# Patient Record
Sex: Female | Born: 1970
Health system: Southern US, Community
[De-identification: ages and names within clinical notes are randomized; demographics above are authoritative.]

## PROBLEM LIST (undated history)

## (undated) DIAGNOSIS — D649 Anemia, unspecified: Secondary | ICD-10-CM

## (undated) DIAGNOSIS — M1990 Unspecified osteoarthritis, unspecified site: Secondary | ICD-10-CM

## (undated) DIAGNOSIS — H35039 Hypertensive retinopathy, unspecified eye: Secondary | ICD-10-CM

## (undated) DIAGNOSIS — E119 Type 2 diabetes mellitus without complications: Secondary | ICD-10-CM

## (undated) DIAGNOSIS — I10 Essential (primary) hypertension: Secondary | ICD-10-CM

## (undated) HISTORY — DX: Essential (primary) hypertension: I10

## (undated) HISTORY — PX: BACK SURGERY: SHX140

## (undated) HISTORY — DX: Type 2 diabetes mellitus without complications: E11.9

## (undated) HISTORY — DX: Hypertensive retinopathy, unspecified eye: H35.039

---

## 1998-06-24 ENCOUNTER — Encounter: Admission: RE | Admit: 1998-06-24 | Discharge: 1998-06-24 | Payer: Self-pay | Admitting: *Deleted

## 1999-12-12 ENCOUNTER — Inpatient Hospital Stay (HOSPITAL_COMMUNITY): Admission: AD | Admit: 1999-12-12 | Discharge: 1999-12-12 | Payer: Self-pay | Admitting: *Deleted

## 2001-05-12 ENCOUNTER — Emergency Department (HOSPITAL_COMMUNITY): Admission: EM | Admit: 2001-05-12 | Discharge: 2001-05-12 | Payer: Self-pay | Admitting: Emergency Medicine

## 2001-05-12 ENCOUNTER — Encounter: Payer: Self-pay | Admitting: Emergency Medicine

## 2005-05-21 ENCOUNTER — Emergency Department (HOSPITAL_COMMUNITY): Admission: EM | Admit: 2005-05-21 | Discharge: 2005-05-21 | Payer: Self-pay | Admitting: Emergency Medicine

## 2005-05-22 ENCOUNTER — Emergency Department (HOSPITAL_COMMUNITY): Admission: EM | Admit: 2005-05-22 | Discharge: 2005-05-22 | Payer: Self-pay | Admitting: Emergency Medicine

## 2005-06-01 ENCOUNTER — Emergency Department (HOSPITAL_COMMUNITY): Admission: EM | Admit: 2005-06-01 | Discharge: 2005-06-01 | Payer: Self-pay | Admitting: Emergency Medicine

## 2005-10-15 ENCOUNTER — Ambulatory Visit: Payer: Self-pay | Admitting: Nurse Practitioner

## 2005-10-17 ENCOUNTER — Ambulatory Visit (HOSPITAL_COMMUNITY): Admission: RE | Admit: 2005-10-17 | Discharge: 2005-10-17 | Payer: Self-pay | Admitting: Internal Medicine

## 2005-10-24 ENCOUNTER — Ambulatory Visit: Payer: Self-pay | Admitting: Nurse Practitioner

## 2005-11-06 ENCOUNTER — Ambulatory Visit: Payer: Self-pay | Admitting: *Deleted

## 2006-03-22 ENCOUNTER — Inpatient Hospital Stay (HOSPITAL_COMMUNITY): Admission: RE | Admit: 2006-03-22 | Discharge: 2006-03-25 | Payer: Self-pay | Admitting: Neurosurgery

## 2007-09-10 ENCOUNTER — Emergency Department (HOSPITAL_COMMUNITY): Admission: EM | Admit: 2007-09-10 | Discharge: 2007-09-10 | Payer: Self-pay | Admitting: Emergency Medicine

## 2007-12-14 ENCOUNTER — Emergency Department (HOSPITAL_COMMUNITY): Admission: EM | Admit: 2007-12-14 | Discharge: 2007-12-14 | Payer: Self-pay | Admitting: Emergency Medicine

## 2007-12-16 ENCOUNTER — Emergency Department (HOSPITAL_COMMUNITY): Admission: EM | Admit: 2007-12-16 | Discharge: 2007-12-16 | Payer: Self-pay | Admitting: Emergency Medicine

## 2011-05-18 NOTE — Op Note (Signed)
Karen Tanner, Karen Tanner NO.:  [id number]   MEDICAL RECORD NO.:  [id number]          PATIENT TYPE:  INP   LOCATION:  3010                         FACILITY:  [facility]   PHYSICIAN:  Danae Orleans. Venetia Maxon, M.D.  DATE OF BIRTH:  Jan 05, 1971   DATE OF PROCEDURE:  03/22/2006  DATE OF DISCHARGE:                                 OPERATIVE REPORT   PREOPERATIVE DIAGNOSIS:  Lumbar disk  herniation and spondylosis, stenosis,  degenerative disk disease, radiculopathy and morbid obesity, L5-S1 level.   POSTOPERATIVE DIAGNOSIS:  Lumbar disk  herniation and spondylosis, stenosis,  degenerative disk disease, radiculopathy and morbid obesity, L5-S1 level.   OPERATION PERFORMED:  Bilateral diskectomy L5-S1 with posterior lumbar  interbody fusion with 10 mm PEEK interbody cages, morcellized bone autograft  and Osteocel with pedicle screw fixation, L5 through S1 levels bilaterally  with posterolateral arthrodesis with morcellized bone autograft and  Osteocel.   SURGEON:  Danae Orleans. Venetia Maxon, M.D.   ASSISTANT:  Cristi Loron, M.D.   ANESTHESIA:  General endotracheal.   ESTIMATED BLOOD LOSS:  150 mL.   COMPLICATIONS:  None.   DISPOSITION:  Recovery.   INDICATIONS FOR PROCEDURE:  Karen Tanner is a 40 year old woman with a  very large disk herniation at the L5-S1 level with severe spinal stenosis at  this level.  She has significant degeneration at this level.  She is  morbidly obese.  It was elected to take her to surgery for decompression and  fusion at this level.   DESCRIPTION OF PROCEDURE:  Ms. Blass was brought to the operating room.  Following the satisfactory and uncomplicated induction of general  endotracheal anesthesia and placement of intravenous lines, the patient was  placed in a prone position on the [place] table.  The low back was then  prepped and draped in the usual sterile fashion.  The area of planned  incision was infiltrated with 0.25% Marcaine, 0.5%  lidocaine, 1:200,000  epinephrine.  Incision was made in the posterior lumbar skin, carried  through approximately 5 to 6 inches of adipose tissue to the lumbodorsal  fascia which was incised bilaterally.  Subperiosteal dissection was then  performed exposing what was felt to be the L5-S1 interspace and  intraoperative x-ray confirmed this to be the L5-S1 interspace.  Subsequently the transverse processes of L5 and the sacral ala were exposed  bilaterally.  120 mm retractor arms were placed with a Versatrac retractor  and a laminectomy of L5 was performed bilaterally removing the laminae of L5  and decorticating the superior aspect of the S1 facet and the sacral ala  bilaterally.  Subsequently, an extremely large disk herniation was removed  initially on the left subsequently on the right.  There was severe  compression of the nerve roots and thecal sac and the interspace was  evacuated of disk material.  Aggressive diskectomy was performed.  Subsequently the trial spacer was placed.  A 10 mm spacer was placed and the  end plates were stripped of residual disk material down to bleeding bone.  5  cc of Osteocel was mixed with morcellized bone autograft as  well as  drillings from the laminae of L5 and this was then mixed together, packed  within  10 mm PEEK interbody cages which were then inserted in the  interspace and countersunk appropriately bilaterally.  Additional bone  autograft with Osteocel was then placed within the interspace.  Subsequently  the transverse processes of L4 and sacral ala were decorticated bilaterally  and 40 mm x 6.5 mm sacral screws were placed at sacrum with bicortical  purchase and subsequently 40 mm x 6.5 mm screws were placed at L5.  All  screws had excellent purchase and no evidence of any cutouts and the  positioning was confirmed on AP and lateral fluoroscopy.  40 mm rods were  placed after the remaining bone autograft and Osteocel mixture was placed  over  the transverse processes of L5 sacral ala bilaterally and prior to  doing so, the wound was irrigated.  40 mm rods were placed and locked down  in situ and the lumbodorsal fascia was closed with 1 Vicryl suture.  The  subcutaneous tissues were reapproximated with 2-0 Vicryl interrupted  inverted sutures and the skin edges were reapproximated with interrupted 2-0  and 3-0 Vicryl subcuticular stitch.  The wound was dressed with benzoin and  Steri-Strips, Telfa gauze and tape.  The patient was extubated in the  operating room and taken to the recovery room in stable and satisfactory  condition having tolerated the operation well.  Counts were correct at the  end of the case.      Danae Orleans. Venetia Maxon, M.D.  Electronically Signed     JDS/MEDQ  D:  03/22/2006  T:  03/25/2006  Job:  [id number]

## 2011-05-18 NOTE — Discharge Summary (Signed)
[name], [name] NO.:  [id number]   MEDICAL RECORD NO.:  [id number]          PATIENT TYPE:  INP   LOCATION:  3010                         FACILITY:  [facility]   PHYSICIAN:  Danae Orleans. Venetia Maxon, M.D.  DATE OF BIRTH:  [birth year]   DATE OF ADMISSION:  03/22/2006  DATE OF DISCHARGE:  03/25/2006                                 DISCHARGE SUMMARY   REASON FOR ADMISSION:  Lumbosacral spondylosis, lumbar disk degeneration,  lumbar disk herniation, morbid obesity, tobacco use disorder, and lumbar  radiculopathy.   FINAL DIAGNOSES:  1.  Lumbosacral spondylosis.  2.  Lumbar disk degeneration.  3.  Lumbar disk herniation.  4.  Morbid obesity.  5.  Tobacco use disorder.  6.  Lumbar radiculopathy.   HISTORY OF ILLNESS AND HOSPITAL COURSE:  Karen Tanner is a 40 year old  woman with severe low back and bilateral lower extremity pain with large L5-  S1 disk herniation with significant disk degeneration at this level.  She  has low back pain and lumbar radiculopathy.  She was admitted to the  hospital on the same day of procedure basis and underwent uncomplicated  decompression and fusion at the L5-S1 level.  She did well with this  procedure, was gradually mobilized, and was doing well on the 26th at which  point she was discharged home with discharge medications of Vicodin with  improved condition.   DISCHARGE DIAGNOSES:  1.  Lumbosacral spondylosis.  2.  Lumbar disk degeneration.  3.  Lumbar disk herniation.  4.  Morbid obesity.  5.  Tobacco use disorder.  6.  Lumbar radiculopathy.   Follow up in three weeks in the office.  Instructed to wear brace, not to  engage in any heavy lifting, bending, twisting, or driving.      Danae Orleans. Venetia Maxon, M.D.  Electronically Signed     JDS/MEDQ  D:  05/15/2006  T:  05/15/2006  Job:  [id number]

## 2011-10-08 LAB — CULTURE, ROUTINE-ABSCESS: Gram Stain: NONE SEEN

## 2013-11-13 ENCOUNTER — Emergency Department (INDEPENDENT_AMBULATORY_CARE_PROVIDER_SITE_OTHER)
Admission: EM | Admit: 2013-11-13 | Discharge: 2013-11-13 | Disposition: A | Discharge status: Home / Self Care | Payer: Self-pay | Source: Home / Self Care | Attending: Family Medicine | Admitting: Family Medicine

## 2013-11-13 ENCOUNTER — Encounter (HOSPITAL_COMMUNITY): Payer: Self-pay | Admitting: Emergency Medicine

## 2013-11-13 ENCOUNTER — Emergency Department (INDEPENDENT_AMBULATORY_CARE_PROVIDER_SITE_OTHER): Payer: Self-pay

## 2013-11-13 DIAGNOSIS — M533 Sacrococcygeal disorders, not elsewhere classified: Secondary | ICD-10-CM

## 2013-11-13 DIAGNOSIS — M545 Low back pain: Secondary | ICD-10-CM

## 2013-11-13 MED ORDER — KETOROLAC TROMETHAMINE 10 MG PO TABS: 10.0000 mg | ORAL_TABLET | Freq: Four times a day (QID) | ORAL | Status: DC | PRN

## 2013-11-13 MED ORDER — KETOROLAC TROMETHAMINE 30 MG/ML IJ SOLN
30.0000 mg | Freq: Once | INTRAMUSCULAR | Status: AC
  Administered 2013-11-13: 30 mg via INTRAMUSCULAR

## 2013-11-13 MED ORDER — CYCLOBENZAPRINE HCL 5 MG PO TABS: 5.0000 mg | ORAL_TABLET | Freq: Three times a day (TID) | ORAL | Status: DC | PRN

## 2013-11-13 MED ORDER — KETOROLAC TROMETHAMINE 30 MG/ML IJ SOLN
INTRAMUSCULAR | Status: AC
  Filled 2013-11-13: qty 1

## 2013-11-13 NOTE — ED Provider Notes (Signed)
CSN: [id number]     Arrival date & time 11/13/13  1212 History   First MD Initiated Contact with Patient 11/13/13 1342     Chief Complaint  Patient presents with  . Foot Pain   (Consider location/radiation/quality/duration/timing/severity/associated sxs/prior Treatment) Patient is a 42 y.o. female presenting with back pain. The history is provided by the patient.  Back Pain Location:  Lumbar spine Quality:  Shooting Radiates to:  Does not radiate Pain severity:  Moderate Duration:  5 days Progression:  Unchanged Chronicity:  New Context: falling   Context comment:  S/p back fusion yrs ago, back has been hurting since fall at [employer] after stubbing toe wearing high heels Associated symptoms: no abdominal pain, no chest pain, no fever, no leg pain, no numbness, no paresthesias, no pelvic pain, no perianal numbness, no tingling and no weakness     History reviewed. No pertinent past medical history. History reviewed. No pertinent past surgical history. History reviewed. No pertinent family history. History  Substance Use Topics  . Smoking status: Never Smoker   . Smokeless tobacco: Not on file  . Alcohol Use: No   OB History   Grav Para Term Preterm Abortions TAB SAB Ect Mult Living                 Review of Systems  Constitutional: Negative.  Negative for fever.  Cardiovascular: Negative for chest pain.  Gastrointestinal: Negative.  Negative for abdominal pain.  Genitourinary: Negative.  Negative for menstrual problem and pelvic pain.  Musculoskeletal: Positive for back pain. Negative for gait problem and joint swelling.  Neurological: Negative for tingling, weakness, numbness and paresthesias.    Allergies  Review of patient's allergies indicates no known allergies.  Home Medications   Current Outpatient Rx  Name  Route  Sig  Dispense  Refill  . cyclobenzaprine (FLEXERIL) 5 MG tablet   Oral   Take 1 tablet (5 mg total) by mouth 3 (three) times daily as  needed for muscle spasms.   30 tablet   0   . ketorolac (TORADOL) 10 MG tablet   Oral   Take 1 tablet (10 mg total) by mouth every 6 (six) hours as needed. for back pain   20 tablet   0    BP 138/76  Pulse 134  Temp(Src) 97.7 F (36.5 C) (Oral)  Resp 16  LMP 10/24/2013 Physical Exam  Nursing note and vitals reviewed. Constitutional: She is oriented to person, place, and time. She appears well-developed and well-nourished.  Abdominal: Soft. Bowel sounds are normal. She exhibits no distension. There is no tenderness.  Musculoskeletal: She exhibits tenderness.       Lumbar back: She exhibits tenderness, bony tenderness and pain. She exhibits normal range of motion, no swelling, no deformity, no spasm and normal pulse.       Back:  Neurological: She is alert and oriented to person, place, and time.  Skin: Skin is warm and dry.    ED Course  Procedures (including critical care time) Labs Review Labs Reviewed - No data to display Imaging Review Dg Lumbar Spine Complete  11/13/2013   CLINICAL DATA:  Low back pain following recent injury  EXAM: LUMBAR SPINE - COMPLETE 4+ VIEW  COMPARISON:  None.  FINDINGS: Postsurgical changes are noted at L5-S1. Vertebral body height is well maintained. No spondylolysis or spondylolisthesis is seen. No hardware failure is noted.  IMPRESSION: Postoperative change.  No acute abnormality is seen.   Electronically Signed   By:  Alcide Clever M.D.   On: 11/13/2013 14:53    EKG Interpretation     Ventricular Rate:    PR Interval:    QRS Duration:   QT Interval:    QTC Calculation:   R Axis:     Text Interpretation:              MDM  X-rays reviewed and report per radiologist.     Linna Hoff, MD 11/13/13 [phone]

## 2013-11-13 NOTE — ED Notes (Signed)
C/o pain in her foot past couple of days; no new injury

## 2014-01-06 ENCOUNTER — Ambulatory Visit: Payer: Self-pay

## 2014-01-28 ENCOUNTER — Ambulatory Visit: Payer: Self-pay | Attending: Internal Medicine

## 2014-04-13 ENCOUNTER — Ambulatory Visit: Payer: Self-pay | Admitting: Internal Medicine

## 2014-07-06 ENCOUNTER — Ambulatory Visit: Payer: Self-pay | Admitting: Internal Medicine

## 2014-10-26 ENCOUNTER — Encounter: Payer: Self-pay | Admitting: Internal Medicine

## 2014-10-26 ENCOUNTER — Ambulatory Visit: Payer: Self-pay | Attending: Internal Medicine | Admitting: Internal Medicine

## 2014-10-26 ENCOUNTER — Ambulatory Visit (HOSPITAL_COMMUNITY)
Admission: RE | Admit: 2014-10-26 | Discharge: 2014-10-26 | Disposition: A | Discharge status: Home / Self Care | Payer: Self-pay | Source: Ambulatory Visit | Attending: Internal Medicine | Admitting: Internal Medicine

## 2014-10-26 VITALS — BP 143/90 | HR 83 | Temp 98.0°F | Resp 16 | Wt 276.6 lb

## 2014-10-26 DIAGNOSIS — F17210 Nicotine dependence, cigarettes, uncomplicated: Secondary | ICD-10-CM | POA: Insufficient documentation

## 2014-10-26 DIAGNOSIS — Z9889 Other specified postprocedural states: Secondary | ICD-10-CM

## 2014-10-26 DIAGNOSIS — R51 Headache: Secondary | ICD-10-CM | POA: Insufficient documentation

## 2014-10-26 DIAGNOSIS — M545 Low back pain, unspecified: Secondary | ICD-10-CM | POA: Insufficient documentation

## 2014-10-26 DIAGNOSIS — Z23 Encounter for immunization: Secondary | ICD-10-CM

## 2014-10-26 DIAGNOSIS — Z139 Encounter for screening, unspecified: Secondary | ICD-10-CM

## 2014-10-26 DIAGNOSIS — IMO0001 Reserved for inherently not codable concepts without codable children: Secondary | ICD-10-CM

## 2014-10-26 DIAGNOSIS — N920 Excessive and frequent menstruation with regular cycle: Secondary | ICD-10-CM | POA: Insufficient documentation

## 2014-10-26 DIAGNOSIS — Z862 Personal history of diseases of the blood and blood-forming organs and certain disorders involving the immune mechanism: Secondary | ICD-10-CM

## 2014-10-26 DIAGNOSIS — M549 Dorsalgia, unspecified: Secondary | ICD-10-CM | POA: Insufficient documentation

## 2014-10-26 DIAGNOSIS — R030 Elevated blood-pressure reading, without diagnosis of hypertension: Secondary | ICD-10-CM | POA: Insufficient documentation

## 2014-10-26 DIAGNOSIS — G8929 Other chronic pain: Secondary | ICD-10-CM | POA: Insufficient documentation

## 2014-10-26 DIAGNOSIS — Z833 Family history of diabetes mellitus: Secondary | ICD-10-CM | POA: Insufficient documentation

## 2014-10-26 DIAGNOSIS — I10 Essential (primary) hypertension: Secondary | ICD-10-CM | POA: Insufficient documentation

## 2014-10-26 DIAGNOSIS — I152 Hypertension secondary to endocrine disorders: Secondary | ICD-10-CM | POA: Insufficient documentation

## 2014-10-26 LAB — COMPLETE METABOLIC PANEL WITH GFR
ALK PHOS: 68 U/L (ref 39–117)
ALT: 12 U/L (ref 0–35)
AST: 17 U/L (ref 0–37)
Albumin: 4.1 g/dL (ref 3.5–5.2)
BILIRUBIN TOTAL: 0.6 mg/dL (ref 0.2–1.2)
BUN: 6 mg/dL (ref 6–23)
CO2: 26 mEq/L (ref 19–32)
CREATININE: 0.72 mg/dL (ref 0.50–1.10)
Calcium: 9.4 mg/dL (ref 8.4–10.5)
Chloride: 101 mEq/L (ref 96–112)
GFR, Est African American: >89 mL/min
GLUCOSE: 89 mg/dL (ref 70–99)
Potassium: 4.3 mEq/L (ref 3.5–5.3)
SODIUM: 137 meq/L (ref 135–145)
TOTAL PROTEIN: 7.4 g/dL (ref 6.0–8.3)

## 2014-10-26 LAB — TSH: TSH: 0.632 u[IU]/mL (ref 0.350–4.500)

## 2014-10-26 LAB — HEMOGLOBIN A1C
Hgb A1c MFr Bld: 6.7 % — ABNORMAL HIGH (ref <5.7)
Mean Plasma Glucose: 146 mg/dL — ABNORMAL HIGH (ref <117)

## 2014-10-26 LAB — LIPID PANEL
CHOLESTEROL: 168 mg/dL (ref 0–200)
HDL: 52 mg/dL (ref >39)
LDL CALC: 97 mg/dL (ref 0–99)
TRIGLYCERIDES: 96 mg/dL (ref <150)
Total CHOL/HDL Ratio: 3.2 Ratio
VLDL: 19 mg/dL (ref 0–40)

## 2014-10-26 MED ORDER — CYCLOBENZAPRINE HCL 10 MG PO TABS: 10.0000 mg | ORAL_TABLET | Freq: Every day | ORAL | Status: DC

## 2014-10-26 MED ORDER — AMLODIPINE BESYLATE 2.5 MG PO TABS
2.5000 mg | ORAL_TABLET | Freq: Every day | ORAL | Status: DC
Start: 2014-10-26 — End: 2014-12-09

## 2014-10-26 MED ORDER — IBUPROFEN 600 MG PO TABS: 600.0000 mg | ORAL_TABLET | Freq: Three times a day (TID) | ORAL | Status: DC | PRN

## 2014-10-26 NOTE — Patient Instructions (Addendum)
DASH Eating Plan DASH stands for "Dietary Approaches to Stop Hypertension." The DASH eating plan is a healthy eating plan that has been shown to reduce high blood pressure (hypertension). Additional health benefits may include reducing the risk of type 2 diabetes mellitus, heart disease, and stroke. The DASH eating plan may also help with weight loss. WHAT DO I NEED TO KNOW ABOUT THE DASH EATING PLAN? For the DASH eating plan, you will follow these general guidelines:  Choose foods with a percent daily value for sodium of less than 5% (as listed on the food label).  Use salt-free seasonings or herbs instead of table salt or sea salt.  Check with your health care provider or pharmacist before using salt substitutes.  Eat lower-sodium products, often labeled as "lower sodium" or "no salt added."  Eat fresh foods.  Eat more vegetables, fruits, and low-fat dairy products.  Choose whole grains. Look for the word "whole" as the first word in the ingredient list.  Choose fish and skinless chicken or turkey more often than red meat. Limit fish, poultry, and meat to 6 oz (170 g) each day.  Limit sweets, desserts, sugars, and sugary drinks.  Choose heart-healthy fats.  Limit cheese to 1 oz (28 g) per day.  Eat more home-cooked food and less restaurant, buffet, and fast food.  Limit fried foods.  Cook foods using methods other than frying.  Limit canned vegetables. If you do use them, rinse them well to decrease the sodium.  When eating at a restaurant, ask that your food be prepared with less salt, or no salt if possible. WHAT FOODS CAN I EAT? Seek help from a dietitian for individual calorie needs. Grains Whole grain or whole wheat bread. Brown rice. Whole grain or whole wheat pasta. Quinoa, bulgur, and whole grain cereals. Low-sodium cereals. Corn or whole wheat flour tortillas. Whole grain cornbread. Whole grain crackers. Low-sodium crackers. Vegetables Fresh or frozen vegetables  (raw, steamed, roasted, or grilled). Low-sodium or reduced-sodium tomato and vegetable juices. Low-sodium or reduced-sodium tomato sauce and paste. Low-sodium or reduced-sodium canned vegetables.  Fruits All fresh, canned (in natural juice), or frozen fruits. Meat and Other Protein Products Ground beef (85% or leaner), grass-fed beef, or beef trimmed of fat. Skinless chicken or turkey. Ground chicken or turkey. Pork trimmed of fat. All fish and seafood. Eggs. Dried beans, peas, or lentils. Unsalted nuts and seeds. Unsalted canned beans. Dairy Low-fat dairy products, such as skim or 1% milk, 2% or reduced-fat cheeses, low-fat ricotta or cottage cheese, or plain low-fat yogurt. Low-sodium or reduced-sodium cheeses. Fats and Oils Tub margarines without trans fats. Light or reduced-fat mayonnaise and salad dressings (reduced sodium). Avocado. Safflower, olive, or canola oils. Natural peanut or almond butter. Other Unsalted popcorn and pretzels. The items listed above may not be a complete list of recommended foods or beverages. Contact your dietitian for more options. WHAT FOODS ARE NOT RECOMMENDED? Grains White bread. White pasta. White rice. Refined cornbread. Bagels and croissants. Crackers that contain trans fat. Vegetables Creamed or fried vegetables. Vegetables in a cheese sauce. Regular canned vegetables. Regular canned tomato sauce and paste. Regular tomato and vegetable juices. Fruits Dried fruits. Canned fruit in light or heavy syrup. Fruit juice. Meat and Other Protein Products Fatty cuts of meat. Ribs, chicken wings, bacon, sausage, bologna, salami, chitterlings, fatback, hot dogs, bratwurst, and packaged luncheon meats. Salted nuts and seeds. Canned beans with salt. Dairy Whole or 2% milk, cream, half-and-half, and cream cheese. Whole-fat or sweetened yogurt. Full-fat   cheeses or blue cheese. Nondairy creamers and whipped toppings. Processed cheese, cheese spreads, or cheese  curds. Condiments Onion and garlic salt, seasoned salt, table salt, and sea salt. Canned and packaged gravies. Worcestershire sauce. Tartar sauce. Barbecue sauce. Teriyaki sauce. Soy sauce, including reduced sodium. Steak sauce. Fish sauce. Oyster sauce. Cocktail sauce. Horseradish. Ketchup and mustard. Meat flavorings and tenderizers. Bouillon cubes. Hot sauce. Tabasco sauce. Marinades. Taco seasonings. Relishes. Fats and Oils Butter, stick margarine, lard, shortening, ghee, and bacon fat. Coconut, palm kernel, or palm oils. Regular salad dressings. Other Pickles and olives. Salted popcorn and pretzels. The items listed above may not be a complete list of foods and beverages to avoid. Contact your dietitian for more information. WHERE CAN I FIND MORE INFORMATION? National Heart, Lung, and Blood Institute: www.nhlbi.nih.gov/health/health-topics/topics/dash/ Document Released: 12/06/2011 Document Revised: 05/03/2014 Document Reviewed: 10/21/2013 ExitCare Patient Information 2015 [employer]. This information is not intended to replace advice given to you by your health care provider. Make sure you discuss any questions you have with your health care provider. Smoking Cessation Quitting smoking is important to your health and has many advantages. However, it is not always easy to quit since nicotine is a very addictive drug. Oftentimes, people try 3 times or more before being able to quit. This document explains the best ways for you to prepare to quit smoking. Quitting takes hard work and a lot of effort, but you can do it. ADVANTAGES OF QUITTING SMOKING  You will live longer, feel better, and live better.  Your body will feel the impact of quitting smoking almost immediately.  Within 20 minutes, blood pressure decreases. Your pulse returns to its normal level.  After 8 hours, carbon monoxide levels in the blood return to normal. Your oxygen level increases.  After 24 hours, the chance of  having a heart attack starts to decrease. Your breath, hair, and body stop smelling like smoke.  After 48 hours, damaged nerve endings begin to recover. Your sense of taste and smell improve.  After 72 hours, the body is virtually free of nicotine. Your bronchial tubes relax and breathing becomes easier.  After 2 to 12 weeks, lungs can hold more air. Exercise becomes easier and circulation improves.  The risk of having a heart attack, stroke, cancer, or lung disease is greatly reduced.  After 1 year, the risk of coronary heart disease is cut in half.  After 5 years, the risk of stroke falls to the same as a nonsmoker.  After 10 years, the risk of lung cancer is cut in half and the risk of other cancers decreases significantly.  After 15 years, the risk of coronary heart disease drops, usually to the level of a nonsmoker.  If you are pregnant, quitting smoking will improve your chances of having a healthy baby.  The people you live with, especially any children, will be healthier.  You will have extra money to spend on things other than cigarettes. QUESTIONS TO THINK ABOUT BEFORE ATTEMPTING TO QUIT You may want to talk about your answers with your health care provider.  Why do you want to quit?  If you tried to quit in the past, what helped and what did not?  What will be the most difficult situations for you after you quit? How will you plan to handle them?  Who can help you through the tough times? Your family? Friends? A health care provider?  What pleasures do you get from smoking? What ways can you still get pleasure   if you quit? Here are some questions to ask your health care provider:  How can you help me to be successful at quitting?  What medicine do you think would be best for me and how should I take it?  What should I do if I need more help?  What is smoking withdrawal like? How can I get information on withdrawal? GET READY  Set a quit date.  Change your  environment by getting rid of all cigarettes, ashtrays, matches, and lighters in your home, car, or work. Do not let people smoke in your home.  Review your past attempts to quit. Think about what worked and what did not. GET SUPPORT AND ENCOURAGEMENT You have a better chance of being successful if you have help. You can get support in many ways.  Tell your family, friends, and coworkers that you are going to quit and need their support. Ask them not to smoke around you.  Get individual, group, or telephone counseling and support. Programs are available at local hospitals and health centers. Call your local health department for information about programs in your area.  Spiritual beliefs and practices may help some smokers quit.  Download a "quit meter" on your computer to keep track of quit statistics, such as how long you have gone without smoking, cigarettes not smoked, and money saved.  Get a self-help book about quitting smoking and staying off tobacco. LEARN NEW SKILLS AND BEHAVIORS  Distract yourself from urges to smoke. Talk to someone, go for a walk, or occupy your time with a task.  Change your normal routine. Take a different route to work. Drink tea instead of coffee. Eat breakfast in a different place.  Reduce your stress. Take a hot bath, exercise, or read a book.  Plan something enjoyable to do every day. Reward yourself for not smoking.  Explore interactive web-based programs that specialize in helping you quit. GET MEDICINE AND USE IT CORRECTLY Medicines can help you stop smoking and decrease the urge to smoke. Combining medicine with the above behavioral methods and support can greatly increase your chances of successfully quitting smoking.  Nicotine replacement therapy helps deliver nicotine to your body without the negative effects and risks of smoking. Nicotine replacement therapy includes nicotine gum, lozenges, inhalers, nasal sprays, and skin patches. Some may be  available over-the-counter and others require a prescription.  Antidepressant medicine helps people abstain from smoking, but how this works is unknown. This medicine is available by prescription.  Nicotinic receptor partial agonist medicine simulates the effect of nicotine in your brain. This medicine is available by prescription. Ask your health care provider for advice about which medicines to use and how to use them based on your health history. Your health care provider will tell you what side effects to look out for if you choose to be on a medicine or therapy. Carefully read the information on the package. Do not use any other product containing nicotine while using a nicotine replacement product.  RELAPSE OR DIFFICULT SITUATIONS Most relapses occur within the first 3 months after quitting. Do not be discouraged if you start smoking again. Remember, most people try several times before finally quitting. You may have symptoms of withdrawal because your body is used to nicotine. You may crave cigarettes, be irritable, feel very hungry, cough often, get headaches, or have difficulty concentrating. The withdrawal symptoms are only temporary. They are strongest when you first quit, but they will go away within 10-14 days. To reduce the   chances of relapse, try to:  Avoid drinking alcohol. Drinking lowers your chances of successfully quitting.  Reduce the amount of caffeine you consume. Once you quit smoking, the amount of caffeine in your body increases and can give you symptoms, such as a rapid heartbeat, sweating, and anxiety.  Avoid smokers because they can make you want to smoke.  Do not let weight gain distract you. Many smokers will gain weight when they quit, usually less than 10 pounds. Eat a healthy diet and stay active. You can always lose the weight gained after you quit.  Find ways to improve your mood other than smoking. FOR MORE INFORMATION  www.smokefree.gov  Document Released:  12/11/2001 Document Revised: 05/03/2014 Document Reviewed: 03/27/2012 ExitCare Patient Information 2015 [employer]. This information is not intended to replace advice given to you by your health care provider. Make sure you discuss any questions you have with your health care provider.  

## 2014-10-26 NOTE — Progress Notes (Signed)
Patient here to establish care Patient has a history of of back surgery-(bone fusion) Has trouble standing for long periods of time Today presents with elevated blood pressure and headache for the past couple  Of days

## 2014-10-26 NOTE — Progress Notes (Signed)
Patient Demographics  Karen Tanner, is a 43 y.o. female  [id number]  [id number]  DOB - 1971/09/18  CC:  Chief Complaint  Patient presents with  . Establish Care    back pain and headache       HPI: Karen Tanner is a 43 y.o. female here today to establish medical care. Patient has history of lower back surgery, complaints of chronic lower back pain, as per patient after surgery she lost follow with her back surgeon, she was also told her recently that her blood pressure is elevated and today her blood pressure is 143/90 she does complain of occasional headaches. Patient also has not had mammogram, Pap smear done, she also reports menorrhagia and was also told her iron was low. Patient has No headache, No chest pain, No abdominal pain - No Nausea, No new weakness tingling or numbness, No Cough - SOB.  No Known Allergies History reviewed. No pertinent past medical history. Current Outpatient Prescriptions on File Prior to Visit  Medication Sig Dispense Refill  . ketorolac (TORADOL) 10 MG tablet Take 1 tablet (10 mg total) by mouth every 6 (six) hours as needed. for back pain  20 tablet  0   No current facility-administered medications on file prior to visit.   Family History  Problem Relation Age of Onset  . Diabetes Mother   . Hyperlipidemia Mother   . Heart disease Mother   . Diabetes Maternal Grandmother    History   Social History  . Marital Status: Single    Spouse Name: N/A    Number of Children: N/A  . Years of Education: N/A   Occupational History  . Not on file.   Social History Main Topics  . Smoking status: Current Some Day Smoker -- 0.25 packs/day for 14 years  . Smokeless tobacco: Not on file  . Alcohol Use: Yes  . Drug Use: Not on file  . Sexual Activity: Not on file   Other Topics Concern  . Not on file   Social History Narrative  . No narrative on file    Review of Systems: Constitutional: Negative for fever, chills,  diaphoresis, activity change, appetite change and fatigue. HENT: Negative for ear pain, nosebleeds, congestion, facial swelling, rhinorrhea, neck pain, neck stiffness and ear discharge.  Eyes: Negative for pain, discharge, redness, itching and visual disturbance. Respiratory: Negative for cough, choking, chest tightness, shortness of breath, wheezing and stridor.  Cardiovascular: Negative for chest pain, palpitations and leg swelling. Gastrointestinal: Negative for abdominal distention. Genitourinary: Negative for dysuria, urgency, frequency, hematuria, flank pain, decreased urine volume, difficulty urinating and dyspareunia.  Musculoskeletal: Negative for back pain, joint swelling, arthralgia and gait problem. Neurological: Negative for dizziness, tremors, seizures, syncope, facial asymmetry, speech difficulty, weakness, light-headedness, numbness and headaches.  Hematological: Negative for adenopathy. Does not bruise/bleed easily. Psychiatric/Behavioral: Negative for hallucinations, behavioral problems, confusion, dysphoric mood, decreased concentration and agitation.    Objective:   Filed Vitals:   10/26/14 1424  BP: 143/90  Pulse: 83  Temp:   Resp:     Physical Exam: Constitutional: Obese female sitting comfortably not in acute distress. HENT: Normocephalic, atraumatic, External right and left ear normal. Oropharynx is clear and moist.  Eyes: Conjunctivae and EOM are normal. PERRLA, no scleral icterus. Neck: Normal ROM. Neck supple. No JVD. No tracheal deviation. No thyromegaly. CVS: RRR, S1/S2 +, no murmurs, no gallops, no carotid bruit.  Pulmonary: Effort and breath sounds normal, no stridor, rhonchi, wheezes, rales.  Abdominal: Soft.  BS +, no distension, tenderness, rebound or guarding.  Musculoskeletal: lower lumbar old  Mid line surgical scar, patient has difficulty laying down flat  Neuro: Alert. Normal reflexes, muscle tone coordination. No cranial nerve deficit. Skin:  Skin is warm and dry. No rash noted. Not diaphoretic. No erythema. No pallor.  No results found for this basename: WBC, HGB, HCT, MCV, PLT   No results found for this basename: CREATININE, BUN, NA, K, CL, CO2    No results found for this basename: HGBA1C   Lipid Panel  No results found for this basename: chol, trig, hdl, cholhdl, vldl, ldlcalc       Assessment and plan:   1. History of back surgery Will repeat an x-ray - DG Lumbar Spine Complete; Future   3. Family history of diabetes mellitus (DM)  - Hemoglobin A1c  4. History of anemia  - Anemia panel  5. Essential hypertension Advised patient for DASH diet, also did on Norvasc, she will follow up in 2 weeks for nurse visit. - amLODipine (NORVASC) 2.5 MG tablet; Take 1 tablet (2.5 mg total) by mouth daily.  Dispense: 90 tablet; Refill: 3  6. Chronic low back pain Advised patient to apply heating pad.  - ibuprofen (ADVIL,MOTRIN) 600 MG tablet; Take 1 tablet (600 mg total) by mouth every 8 (eight) hours as needed.  Dispense: 30 tablet; Refill: 1 - cyclobenzaprine (FLEXERIL) 10 MG tablet; Take 1 tablet (10 mg total) by mouth at bedtime.  Dispense: 30 tablet; Refill: 1 - DG Lumbar Spine Complete; Future  7. Menorrhagia with regular cycle  - Ambulatory referral to Obstetrics / Gynecology  8. Screening  - CBC with Differential - COMPLETE METABOLIC PANEL WITH GFR - TSH - Lipid panel - Vit D  25 hydroxy (rtn osteoporosis monitoring) - MM DIGITAL SCREENING BILATERAL; Future      Health Maintenance : -Pap Smear: referred to GYN -Mammogram: ordered  -Vaccinations:  Flu shot given today   Return in about 3 months (around 01/26/2015) for hypertension, BP check in 2 weeks/Nurse Visit.   Lorayne Marek, MD

## 2014-10-27 LAB — CBC WITH DIFFERENTIAL/PLATELET
BASOS PCT: 1 % (ref 0–1)
Basophils Absolute: 0.1 10*3/uL (ref 0.0–0.1)
EOS ABS: 0.1 10*3/uL (ref 0.0–0.7)
EOS PCT: 1 % (ref 0–5)
HEMATOCRIT: 34.8 % — AB (ref 36.0–46.0)
HEMOGLOBIN: 10.7 g/dL — AB (ref 12.0–15.0)
LYMPHS ABS: 2.5 10*3/uL (ref 0.7–4.0)
LYMPHS PCT: 19 % (ref 12–46)
MCH: 20.5 pg — ABNORMAL LOW (ref 26.0–34.0)
MCHC: 30.7 g/dL (ref 30.0–36.0)
MCV: 66.8 fL — AB (ref 78.0–100.0)
MONO ABS: 0.9 10*3/uL (ref 0.1–1.0)
MONOS PCT: 7 % (ref 3–12)
NEUTROS ABS: 9.4 10*3/uL — AB (ref 1.7–7.7)
Neutrophils Relative %: 72 % (ref 43–77)
Platelets: 375 10*3/uL (ref 150–400)
RBC: 5.21 MIL/uL — AB (ref 3.87–5.11)
RDW: 18.8 % — ABNORMAL HIGH (ref 11.5–15.5)
WBC: 13 10*3/uL — AB (ref 4.0–10.5)

## 2014-10-27 LAB — VITAMIN D 25 HYDROXY (VIT D DEFICIENCY, FRACTURES): Vit D, 25-Hydroxy: 14 ng/mL — ABNORMAL LOW (ref 30–89)

## 2014-10-27 LAB — ANEMIA PANEL
%SAT: 4 % — ABNORMAL LOW (ref 20–55)
ABS RETIC: 125 10*3/uL (ref 19.0–186.0)
Ferritin: 13 ng/mL (ref 10–291)
Folate: 10 ng/mL
IRON: 19 ug/dL — AB (ref 42–145)
RBC.: 5.21 MIL/uL — ABNORMAL HIGH (ref 3.87–5.11)
RETIC CT PCT: 2.4 % — AB (ref 0.4–2.3)
TIBC: 528 ug/dL — ABNORMAL HIGH (ref 250–470)
UIBC: 509 ug/dL — AB (ref 125–400)
VITAMIN B 12: 448 pg/mL (ref 211–911)

## 2014-11-09 ENCOUNTER — Ambulatory Visit: Payer: Self-pay | Attending: Internal Medicine | Admitting: *Deleted

## 2014-11-09 VITALS — BP 160/80 | HR 71 | Temp 98.7°F | Resp 18

## 2014-11-09 DIAGNOSIS — E559 Vitamin D deficiency, unspecified: Secondary | ICD-10-CM | POA: Insufficient documentation

## 2014-11-09 DIAGNOSIS — D509 Iron deficiency anemia, unspecified: Secondary | ICD-10-CM | POA: Insufficient documentation

## 2014-11-09 DIAGNOSIS — E119 Type 2 diabetes mellitus without complications: Secondary | ICD-10-CM | POA: Insufficient documentation

## 2014-11-09 MED ORDER — IRON 325 (65 FE) MG PO TABS: 1.0000 | ORAL_TABLET | Freq: Three times a day (TID) | ORAL | Status: DC

## 2014-11-09 MED ORDER — ERGOCALCIFEROL 1.25 MG (50000 UT) PO CAPS: 50000.0000 [IU] | ORAL_CAPSULE | ORAL | Status: AC

## 2014-11-09 MED ORDER — METFORMIN HCL 500 MG PO TABS: 500.0000 mg | ORAL_TABLET | Freq: Every day | ORAL | Status: DC

## 2014-11-09 NOTE — Progress Notes (Signed)
Patient presents for BP check after starting amlodipine 2.5 mg daily States taking med as directed  BP 160/80 left arm manually with large cuff P 71 R 18 T 98.7 SPO2 98%  Labs reviewed with patient. Patient made aware that she has T2DM, anemia and Vit D deficiency Given information on low carb diet and exercise requirements Metformin, iron and Vit D e-scribed to [facility] as directed below.  Per PCP: Increase amlodipine to 5 mg daily DASH diet Referral to diabetic education/nutrition  Patient instructed to return in 2 weeks for nurse visit for BP check and CBG check Information provided on DASH Eating plan, Diabetes and Education and Diabetes and Food     Notes Recorded by Lorayne Marek, MD on 10/27/2014 at 3:41 PM Blood work reviewed, noticed low vitamin D, call patient advise to start ergocalciferol 50,000 units once a week for the duration of 12 weeks. Also patient has iron deficiency anemia, advise patient to take iron supplements 3 times a day. Her HbA1c is 6.7%, She has diabetes, advise patient for low carbohydrate diet and start taking metformin 500 mg daily, will repeat A1C in 3 monthsNotes Recorded by Lorayne Marek, MD on

## 2014-11-09 NOTE — Patient Instructions (Signed)
Diabetes Mellitus and Food It is important for you to manage your blood sugar (glucose) level. Your blood glucose level can be greatly affected by what you eat. Eating healthier foods in the appropriate amounts throughout the day at about the same time each day will help you control your blood glucose level. It can also help slow or prevent worsening of your diabetes mellitus. Healthy eating may even help you improve the level of your blood pressure and reach or maintain a healthy weight.  HOW CAN FOOD AFFECT ME? Carbohydrates Carbohydrates affect your blood glucose level more than any other type of food. Your dietitian will help you determine how many carbohydrates to eat at each meal and teach you how to count carbohydrates. Counting carbohydrates is important to keep your blood glucose at a healthy level, especially if you are using insulin or taking certain medicines for diabetes mellitus. Alcohol Alcohol can cause sudden decreases in blood glucose (hypoglycemia), especially if you use insulin or take certain medicines for diabetes mellitus. Hypoglycemia can be a life-threatening condition. Symptoms of hypoglycemia (sleepiness, dizziness, and disorientation) are similar to symptoms of having too much alcohol.  If your health care provider has given you approval to drink alcohol, do so in moderation and use the following guidelines:  Women should not have more than one drink per day, and men should not have more than two drinks per day. One drink is equal to:  12 oz of beer.  5 oz of wine.  1 oz of hard liquor.  Do not drink on an empty stomach.  Keep yourself hydrated. Have water, diet soda, or unsweetened iced tea.  Regular soda, juice, and other mixers might contain a lot of carbohydrates and should be counted. WHAT FOODS ARE NOT RECOMMENDED? As you make food choices, it is important to remember that all foods are not the same. Some foods have fewer nutrients per serving than other  foods, even though they might have the same number of calories or carbohydrates. It is difficult to get your body what it needs when you eat foods with fewer nutrients. Examples of foods that you should avoid that are high in calories and carbohydrates but low in nutrients include:  Trans fats (most processed foods list trans fats on the Nutrition Facts label).  Regular soda.  Juice.  Candy.  Sweets, such as cake, pie, doughnuts, and cookies.  Fried foods. WHAT FOODS CAN I EAT? Have nutrient-rich foods, which will nourish your body and keep you healthy. The food you should eat also will depend on several factors, including:  The calories you need.  The medicines you take.  Your weight.  Your blood glucose level.  Your blood pressure level.  Your cholesterol level. You also should eat a variety of foods, including:  Protein, such as meat, poultry, fish, tofu, nuts, and seeds (lean animal proteins are best).  Fruits.  Vegetables.  Dairy products, such as milk, cheese, and yogurt (low fat is best).  Breads, grains, pasta, cereal, rice, and beans.  Fats such as olive oil, trans fat-free margarine, canola oil, avocado, and olives. DOES EVERYONE WITH DIABETES MELLITUS HAVE THE SAME MEAL PLAN? Because every person with diabetes mellitus is different, there is not one meal plan that works for everyone. It is very important that you meet with a dietitian who will help you create a meal plan that is just right for you. Document Released: 09/13/2005 Document Revised: 12/22/2013 Document Reviewed: 11/13/2013 Exit[employer] Patient Information 2015 [employer]. This   information is not intended to replace advice given to you by your health care provider. Make sure you discuss any questions you have with your health care provider. Diabetes and Exercise Exercising regularly is important. It is not just about losing weight. It has many health benefits, such as:  Improving your overall  fitness, flexibility, and endurance.  Increasing your bone density.  Helping with weight control.  Decreasing your body fat.  Increasing your muscle strength.  Reducing stress and tension.  Improving your overall health. People with diabetes who exercise gain additional benefits because exercise:  Reduces appetite.  Improves the body's use of blood sugar (glucose).  Helps lower or control blood glucose.  Decreases blood pressure.  Helps control blood lipids (such as cholesterol and triglycerides).  Improves the body's use of the hormone insulin by:  Increasing the body's insulin sensitivity.  Reducing the body's insulin needs.  Decreases the risk for heart disease because exercising:  Lowers cholesterol and triglycerides levels.  Increases the levels of good cholesterol (such as high-density lipoproteins [HDL]) in the body.  Lowers blood glucose levels. YOUR ACTIVITY PLAN  Choose an activity that you enjoy and set realistic goals. Your health care provider or diabetes educator can help you make an activity plan that works for you. Exercise regularly as directed by your health care provider. This includes:  Performing resistance training twice a week such as push-ups, sit-ups, lifting weights, or using resistance bands.  Performing 150 minutes of cardio exercises each week such as walking, running, or playing sports.  Staying active and spending no more than 90 minutes at one time being inactive. Even short bursts of exercise are good for you. Three 10-minute sessions spread throughout the day are just as beneficial as a single 30-minute session. Some exercise ideas include:  Taking the dog for a walk.  Taking the stairs instead of the elevator.  Dancing to your favorite song.  Doing an exercise video.  Doing your favorite exercise with a friend. RECOMMENDATIONS FOR EXERCISING WITH TYPE 1 OR TYPE 2 DIABETES   Check your blood glucose before exercising. If  blood glucose levels are greater than 240 mg/dL, check for urine ketones. Do not exercise if ketones are present.  Avoid injecting insulin into areas of the body that are going to be exercised. For example, avoid injecting insulin into:  The arms when playing tennis.  The legs when jogging.  Keep a record of:  Food intake before and after you exercise.  Expected peak times of insulin action.  Blood glucose levels before and after you exercise.  The type and amount of exercise you have done.  Review your records with your health care provider. Your health care provider will help you to develop guidelines for adjusting food intake and insulin amounts before and after exercising.  If you take insulin or oral hypoglycemic agents, watch for signs and symptoms of hypoglycemia. They include:  Dizziness.  Shaking.  Sweating.  Chills.  Confusion.  Drink plenty of water while you exercise to prevent dehydration or heat stroke. Body water is lost during exercise and must be replaced.  Talk to your health care provider before starting an exercise program to make sure it is safe for you. Remember, almost any type of activity is better than none. Document Released: 03/08/2004 Document Revised: 05/03/2014 Document Reviewed: 05/26/2013 ExitCare Patient Information 2015 [employer]. This information is not intended to replace advice given to you by your health care provider. Make sure you discuss any   questions you have with your health care provider. DASH Eating Plan DASH stands for "Dietary Approaches to Stop Hypertension." The DASH eating plan is a healthy eating plan that has been shown to reduce high blood pressure (hypertension). Additional health benefits may include reducing the risk of type 2 diabetes mellitus, heart disease, and stroke. The DASH eating plan may also help with weight loss. WHAT DO I NEED TO KNOW ABOUT THE DASH EATING PLAN? For the DASH eating plan, you will follow  these general guidelines:  Choose foods with a percent daily value for sodium of less than 5% (as listed on the food label).  Use salt-free seasonings or herbs instead of table salt or sea salt.  Check with your health care provider or pharmacist before using salt substitutes.  Eat lower-sodium products, often labeled as "lower sodium" or "no salt added."  Eat fresh foods.  Eat more vegetables, fruits, and low-fat dairy products.  Choose whole grains. Look for the word "whole" as the first word in the ingredient list.  Choose fish and skinless chicken or [place] more often than red meat. Limit fish, poultry, and meat to 6 oz (170 g) each day.  Limit sweets, desserts, sugars, and sugary drinks.  Choose heart-healthy fats.  Limit cheese to 1 oz (28 g) per day.  Eat more home-cooked food and less restaurant, buffet, and fast food.  Limit fried foods.  Cook foods using methods other than frying.  Limit canned vegetables. If you do use them, rinse them well to decrease the sodium.  When eating at a restaurant, ask that your food be prepared with less salt, or no salt if possible. WHAT FOODS CAN I EAT? Seek help from a dietitian for individual calorie needs. Grains Whole grain or whole wheat bread. Brown rice. Whole grain or whole wheat pasta. Quinoa, bulgur, and whole grain cereals. Low-sodium cereals. Corn or whole wheat flour tortillas. Whole grain cornbread. Whole grain crackers. Low-sodium crackers. Vegetables Fresh or frozen vegetables (raw, steamed, roasted, or grilled). Low-sodium or reduced-sodium tomato and vegetable juices. Low-sodium or reduced-sodium tomato sauce and paste. Low-sodium or reduced-sodium canned vegetables.  Fruits All fresh, canned (in natural juice), or frozen fruits. Meat and Other Protein Products Ground beef (85% or leaner), grass-fed beef, or beef trimmed of fat. Skinless chicken or [place]. Ground chicken or [place]. Pork trimmed of fat. All fish and  seafood. Eggs. Dried beans, peas, or lentils. Unsalted nuts and seeds. Unsalted canned beans. Dairy Low-fat dairy products, such as skim or 1% milk, 2% or reduced-fat cheeses, low-fat ricotta or cottage cheese, or plain low-fat yogurt. Low-sodium or reduced-sodium cheeses. Fats and Oils Tub margarines without trans fats. Light or reduced-fat mayonnaise and salad dressings (reduced sodium). Avocado. Safflower, olive, or canola oils. Natural peanut or almond butter. Other Unsalted popcorn and pretzels. The items listed above may not be a complete list of recommended foods or beverages. Contact your dietitian for more options. WHAT FOODS ARE NOT RECOMMENDED? Grains White bread. White pasta. White rice. Refined cornbread. Bagels and croissants. Crackers that contain trans fat. Vegetables Creamed or fried vegetables. Vegetables in a cheese sauce. Regular canned vegetables. Regular canned tomato sauce and paste. Regular tomato and vegetable juices. Fruits Dried fruits. Canned fruit in light or heavy syrup. Fruit juice. Meat and Other Protein Products Fatty cuts of meat. Ribs, chicken wings, bacon, sausage, bologna, salami, chitterlings, fatback, hot dogs, bratwurst, and packaged luncheon meats. Salted nuts and seeds. Canned beans with salt. Dairy Whole or 2% milk, cream,  half-and-half, and cream cheese. Whole-fat or sweetened yogurt. Full-fat cheeses or blue cheese. Nondairy creamers and whipped toppings. Processed cheese, cheese spreads, or cheese curds. Condiments Onion and garlic salt, seasoned salt, table salt, and sea salt. Canned and packaged gravies. Worcestershire sauce. Tartar sauce. Barbecue sauce. Teriyaki sauce. Soy sauce, including reduced sodium. Steak sauce. Fish sauce. Oyster sauce. Cocktail sauce. Horseradish. Ketchup and mustard. Meat flavorings and tenderizers. Bouillon cubes. Hot sauce. Tabasco sauce. Marinades. Taco seasonings. Relishes. Fats and Oils Butter, stick margarine,  lard, shortening, ghee, and bacon fat. Coconut, palm kernel, or palm oils. Regular salad dressings. Other Pickles and olives. Salted popcorn and pretzels. The items listed above may not be a complete list of foods and beverages to avoid. Contact your dietitian for more information. WHERE CAN I FIND MORE INFORMATION? National Heart, Lung, and Blood Institute: travelstabloid.com Document Released: 12/06/2011 Document Revised: 05/03/2014 Document Reviewed: 10/21/2013 [facility] Patient Information 2015 [place], [place]. This information is not intended to replace advice given to you by your health care provider. Make sure you discuss any questions you have with your health care provider.

## 2014-11-18 ENCOUNTER — Encounter: Payer: Self-pay | Admitting: Internal Medicine

## 2014-11-29 ENCOUNTER — Encounter (INDEPENDENT_AMBULATORY_CARE_PROVIDER_SITE_OTHER): Payer: Self-pay

## 2014-11-30 ENCOUNTER — Ambulatory Visit: Payer: Self-pay

## 2014-12-07 ENCOUNTER — Ambulatory Visit: Payer: Self-pay

## 2014-12-09 ENCOUNTER — Ambulatory Visit: Payer: Self-pay | Attending: Internal Medicine | Admitting: *Deleted

## 2014-12-09 VITALS — BP 133/83 | HR 78 | Temp 98.8°F | Resp 18

## 2014-12-09 DIAGNOSIS — I10 Essential (primary) hypertension: Secondary | ICD-10-CM

## 2014-12-09 DIAGNOSIS — F17200 Nicotine dependence, unspecified, uncomplicated: Secondary | ICD-10-CM | POA: Insufficient documentation

## 2014-12-09 DIAGNOSIS — E1165 Type 2 diabetes mellitus with hyperglycemia: Secondary | ICD-10-CM | POA: Insufficient documentation

## 2014-12-09 LAB — GLUCOSE, POCT (MANUAL RESULT ENTRY): POC GLUCOSE: 116 mg/dL — AB (ref 70–99)

## 2014-12-09 MED ORDER — AMLODIPINE BESYLATE 5 MG PO TABS: 5.0000 mg | ORAL_TABLET | Freq: Every day | ORAL | Status: DC

## 2014-12-09 NOTE — Progress Notes (Signed)
Patient presents for BP and CBG check Med list reviewed; states taking all meds as directed Patient does not check blood sugars at home States smoking .5-1 ppd States lowering sodium intake and  will begin working out at [employer] standing and walking increases chronic back pain; rates 10/10 at present Discussed measures to help decrease back pain (icing, wearing shoes with good support, lying with legs elevated)  BP 133/83 P 78 R 18  T  98.8 oral SPO2 97 %  CBG 116  Patient advised to call for med refills at least 7 days before running out so as not to go without. Patient aware that she is to f/u with PCP 3 months from last visit (Due 01/26/15), however,  encouraged pt to schedule appt with PCP sooner to discuss chronic back pain and with NP for pap only

## 2014-12-13 ENCOUNTER — Encounter: Payer: Self-pay | Admitting: Obstetrics & Gynecology

## 2014-12-14 ENCOUNTER — Ambulatory Visit: Payer: Self-pay

## 2014-12-17 ENCOUNTER — Encounter: Payer: Self-pay | Admitting: Internal Medicine

## 2014-12-17 ENCOUNTER — Ambulatory Visit: Payer: Self-pay | Attending: Internal Medicine | Admitting: Internal Medicine

## 2014-12-17 VITALS — BP 145/80 | HR 69 | Temp 98.6°F | Resp 16 | Ht 67.5 in | Wt 270.0 lb

## 2014-12-17 DIAGNOSIS — Z720 Tobacco use: Secondary | ICD-10-CM

## 2014-12-17 DIAGNOSIS — I10 Essential (primary) hypertension: Secondary | ICD-10-CM | POA: Insufficient documentation

## 2014-12-17 DIAGNOSIS — Z9889 Other specified postprocedural states: Secondary | ICD-10-CM

## 2014-12-17 DIAGNOSIS — E119 Type 2 diabetes mellitus without complications: Secondary | ICD-10-CM | POA: Insufficient documentation

## 2014-12-17 DIAGNOSIS — M479 Spondylosis, unspecified: Secondary | ICD-10-CM | POA: Insufficient documentation

## 2014-12-17 DIAGNOSIS — F17200 Nicotine dependence, unspecified, uncomplicated: Secondary | ICD-10-CM

## 2014-12-17 DIAGNOSIS — Z981 Arthrodesis status: Secondary | ICD-10-CM | POA: Insufficient documentation

## 2014-12-17 DIAGNOSIS — Z79899 Other long term (current) drug therapy: Secondary | ICD-10-CM | POA: Insufficient documentation

## 2014-12-17 DIAGNOSIS — G8929 Other chronic pain: Secondary | ICD-10-CM

## 2014-12-17 DIAGNOSIS — M545 Low back pain: Secondary | ICD-10-CM | POA: Insufficient documentation

## 2014-12-17 DIAGNOSIS — F17210 Nicotine dependence, cigarettes, uncomplicated: Secondary | ICD-10-CM | POA: Insufficient documentation

## 2014-12-17 LAB — GLUCOSE, POCT (MANUAL RESULT ENTRY): POC GLUCOSE: 108 mg/dL — AB (ref 70–99)

## 2014-12-17 MED ORDER — IBUPROFEN 600 MG PO TABS: 600.0000 mg | ORAL_TABLET | Freq: Three times a day (TID) | ORAL | Status: DC | PRN

## 2014-12-17 MED ORDER — TRAMADOL HCL 50 MG PO TABS: 50.0000 mg | ORAL_TABLET | Freq: Three times a day (TID) | ORAL | Status: DC | PRN

## 2014-12-17 MED ORDER — CYCLOBENZAPRINE HCL 10 MG PO TABS
10.0000 mg | ORAL_TABLET | Freq: Every day | ORAL | Status: DC
Start: 2014-12-17 — End: 2015-09-22

## 2014-12-17 NOTE — Progress Notes (Signed)
Pt is here following up on her HTN, diabetes and her chronic lower back pain.

## 2014-12-17 NOTE — Patient Instructions (Addendum)
DASH Eating Plan DASH stands for "Dietary Approaches to Stop Hypertension." The DASH eating plan is a healthy eating plan that has been shown to reduce high blood pressure (hypertension). Additional health benefits may include reducing the risk of type 2 diabetes mellitus, heart disease, and stroke. The DASH eating plan may also help with weight loss. WHAT DO I NEED TO KNOW ABOUT THE DASH EATING PLAN? For the DASH eating plan, you will follow these general guidelines:  Choose foods with a percent daily value for sodium of less than 5% (as listed on the food label).  Use salt-free seasonings or herbs instead of table salt or sea salt.  Check with your health care provider or pharmacist before using salt substitutes.  Eat lower-sodium products, often labeled as "lower sodium" or "no salt added."  Eat fresh foods.  Eat more vegetables, fruits, and low-fat dairy products.  Choose whole grains. Look for the word "whole" as the first word in the ingredient list.  Choose fish and skinless chicken or turkey more often than red meat. Limit fish, poultry, and meat to 6 oz (170 g) each day.  Limit sweets, desserts, sugars, and sugary drinks.  Choose heart-healthy fats.  Limit cheese to 1 oz (28 g) per day.  Eat more home-cooked food and less restaurant, buffet, and fast food.  Limit fried foods.  Cook foods using methods other than frying.  Limit canned vegetables. If you do use them, rinse them well to decrease the sodium.  When eating at a restaurant, ask that your food be prepared with less salt, or no salt if possible. WHAT FOODS CAN I EAT? Seek help from a dietitian for individual calorie needs. Grains Whole grain or whole wheat bread. Brown rice. Whole grain or whole wheat pasta. Quinoa, bulgur, and whole grain cereals. Low-sodium cereals. Corn or whole wheat flour tortillas. Whole grain cornbread. Whole grain crackers. Low-sodium crackers. Vegetables Fresh or frozen vegetables  (raw, steamed, roasted, or grilled). Low-sodium or reduced-sodium tomato and vegetable juices. Low-sodium or reduced-sodium tomato sauce and paste. Low-sodium or reduced-sodium canned vegetables.  Fruits All fresh, canned (in natural juice), or frozen fruits. Meat and Other Protein Products Ground beef (85% or leaner), grass-fed beef, or beef trimmed of fat. Skinless chicken or turkey. Ground chicken or turkey. Pork trimmed of fat. All fish and seafood. Eggs. Dried beans, peas, or lentils. Unsalted nuts and seeds. Unsalted canned beans. Dairy Low-fat dairy products, such as skim or 1% milk, 2% or reduced-fat cheeses, low-fat ricotta or cottage cheese, or plain low-fat yogurt. Low-sodium or reduced-sodium cheeses. Fats and Oils Tub margarines without trans fats. Light or reduced-fat mayonnaise and salad dressings (reduced sodium). Avocado. Safflower, olive, or canola oils. Natural peanut or almond butter. Other Unsalted popcorn and pretzels. The items listed above may not be a complete list of recommended foods or beverages. Contact your dietitian for more options. WHAT FOODS ARE NOT RECOMMENDED? Grains White bread. White pasta. White rice. Refined cornbread. Bagels and croissants. Crackers that contain trans fat. Vegetables Creamed or fried vegetables. Vegetables in a cheese sauce. Regular canned vegetables. Regular canned tomato sauce and paste. Regular tomato and vegetable juices. Fruits Dried fruits. Canned fruit in light or heavy syrup. Fruit juice. Meat and Other Protein Products Fatty cuts of meat. Ribs, chicken wings, bacon, sausage, bologna, salami, chitterlings, fatback, hot dogs, bratwurst, and packaged luncheon meats. Salted nuts and seeds. Canned beans with salt. Dairy Whole or 2% milk, cream, half-and-half, and cream cheese. Whole-fat or sweetened yogurt. Full-fat   cheeses or blue cheese. Nondairy creamers and whipped toppings. Processed cheese, cheese spreads, or cheese  curds. Condiments Onion and garlic salt, seasoned salt, table salt, and sea salt. Canned and packaged gravies. Worcestershire sauce. Tartar sauce. Barbecue sauce. Teriyaki sauce. Soy sauce, including reduced sodium. Steak sauce. Fish sauce. Oyster sauce. Cocktail sauce. Horseradish. Ketchup and mustard. Meat flavorings and tenderizers. Bouillon cubes. Hot sauce. Tabasco sauce. Marinades. Taco seasonings. Relishes. Fats and Oils Butter, stick margarine, lard, shortening, ghee, and bacon fat. Coconut, palm kernel, or palm oils. Regular salad dressings. Other Pickles and olives. Salted popcorn and pretzels. The items listed above may not be a complete list of foods and beverages to avoid. Contact your dietitian for more information. WHERE CAN I FIND MORE INFORMATION? National Heart, Lung, and [employer] Institute: www.nhlbi.nih.gov/health/health-topics/topics/dash/ Document Released: 12/06/2011 Document Revised: 05/03/2014 Document Reviewed: 10/21/2013 ExitCare Patient Information 2015 [employer]. This information is not intended to replace advice given to you by your health care provider. Make sure you discuss any questions you have with your health care provider. Diabetes Mellitus and Food It is important for you to manage your blood sugar (glucose) level. Your blood glucose level can be greatly affected by what you eat. Eating healthier foods in the appropriate amounts throughout the day at about the same time each day will help you control your blood glucose level. It can also help slow or prevent worsening of your diabetes mellitus. Healthy eating may even help you improve the level of your blood pressure and reach or maintain a healthy weight.  HOW CAN FOOD AFFECT ME? Carbohydrates Carbohydrates affect your blood glucose level more than any other type of food. Your dietitian will help you determine how many carbohydrates to eat at each meal and teach you how to count carbohydrates. Counting  carbohydrates is important to keep your blood glucose at a healthy level, especially if you are using insulin or taking certain medicines for diabetes mellitus. Alcohol Alcohol can cause sudden decreases in blood glucose (hypoglycemia), especially if you use insulin or take certain medicines for diabetes mellitus. Hypoglycemia can be a life-threatening condition. Symptoms of hypoglycemia (sleepiness, dizziness, and disorientation) are similar to symptoms of having too much alcohol.  If your health care provider has given you approval to drink alcohol, do so in moderation and use the following guidelines:  Women should not have more than one drink per day, and men should not have more than two drinks per day. One drink is equal to:  12 oz of beer.  5 oz of wine.  1 oz of hard liquor.  Do not drink on an empty stomach.  Keep yourself hydrated. Have water, diet soda, or unsweetened iced tea.  Regular soda, juice, and other mixers might contain a lot of carbohydrates and should be counted. WHAT FOODS ARE NOT RECOMMENDED? As you make food choices, it is important to remember that all foods are not the same. Some foods have fewer nutrients per serving than other foods, even though they might have the same number of calories or carbohydrates. It is difficult to get your body what it needs when you eat foods with fewer nutrients. Examples of foods that you should avoid that are high in calories and carbohydrates but low in nutrients include:  Trans fats (most processed foods list trans fats on the Nutrition Facts label).  Regular soda.  Juice.  Candy.  Sweets, such as cake, pie, doughnuts, and cookies.  Fried foods. WHAT FOODS CAN I EAT? Have nutrient-rich foods,   which will nourish your body and keep you healthy. The food you should eat also will depend on several factors, including:  The calories you need.  The medicines you take.  Your weight.  Your blood glucose level.  Your  blood pressure level.  Your cholesterol level. You also should eat a variety of foods, including:  Protein, such as meat, poultry, fish, tofu, nuts, and seeds (lean animal proteins are best).  Fruits.  Vegetables.  Dairy products, such as milk, cheese, and yogurt (low fat is best).  Breads, grains, pasta, cereal, rice, and beans.  Fats such as olive oil, trans fat-free margarine, canola oil, avocado, and olives. DOES EVERYONE WITH DIABETES MELLITUS HAVE THE SAME MEAL PLAN? Because every person with diabetes mellitus is different, there is not one meal plan that works for everyone. It is very important that you meet with a dietitian who will help you create a meal plan that is just right for you. Document Released: 09/13/2005 Document Revised: 12/22/2013 Document Reviewed: 11/13/2013 ExitCare Patient Information 2015 [employer]. This information is not intended to replace advice given to you by your health care provider. Make sure you discuss any questions you have with your health care provider.  

## 2014-12-17 NOTE — Progress Notes (Signed)
MRN: [id number] Name: Karen Tanner  Sex: female Age: 43 y.o. DOB: Sep 12, 1971  Allergies: Review of patient's allergies indicates no known allergies.  Chief Complaint  Patient presents with  . Follow-up    HPI: Patient is 43 y.o. female who was recently diagnosed with diabetes, hypertension, has chronic lower back pain history of back surgery comes today for followup, she reported to have ongoing back pain, she had x-ray done which reported 1. L5-S1 PLIF without complicating feature. . Minimal scattered spondylosis., patient was prescribed her ibuprofen and muscle relaxant as per patient she ran out of that medication, as per patient in the past she was taking oxycodone prior to surgery, today her blood pressure was elevated, repeat manual blood pressure is 145/80 currently taking Norvasc 5 mg, as per patient she is still smoking cigarettes, advised patient to quit smoking.  Past Medical History  Diagnosis Date  . Diabetes mellitus without complication   . Hypertension     Past Surgical History  Procedure Laterality Date  . Back surgery      Bone fusion      Medication List       This list is accurate as of: 12/17/14  3:08 PM.  Always use your most recent med list.               amLODipine 5 MG tablet  Commonly known as:  NORVASC  Take 1 tablet (5 mg total) by mouth daily.     cyclobenzaprine 10 MG tablet  Commonly known as:  FLEXERIL  Take 1 tablet (10 mg total) by mouth at bedtime.     ergocalciferol 50000 UNITS capsule  Commonly known as:  VITAMIN D2  Take 1 capsule (50,000 Units total) by mouth once a week. Take one tablet weekly for 12 weeks     ibuprofen 600 MG tablet  Commonly known as:  ADVIL,MOTRIN  Take 1 tablet (600 mg total) by mouth every 8 (eight) hours as needed.     Iron 325 (65 FE) MG Tabs  Take 1 tablet by mouth 3 (three) times daily.     ketorolac 10 MG tablet  Commonly known as:  TORADOL  Take 1 tablet (10 mg total) by mouth every  6 (six) hours as needed. for back pain     metFORMIN 500 MG tablet  Commonly known as:  GLUCOPHAGE  Take 1 tablet (500 mg total) by mouth daily with breakfast.     traMADol 50 MG tablet  Commonly known as:  ULTRAM  Take 1 tablet (50 mg total) by mouth every 8 (eight) hours as needed for moderate pain.        Meds ordered this encounter  Medications  . ibuprofen (ADVIL,MOTRIN) 600 MG tablet    Sig: Take 1 tablet (600 mg total) by mouth every 8 (eight) hours as needed.    Dispense:  30 tablet    Refill:  2  . cyclobenzaprine (FLEXERIL) 10 MG tablet    Sig: Take 1 tablet (10 mg total) by mouth at bedtime.    Dispense:  30 tablet    Refill:  3  . traMADol (ULTRAM) 50 MG tablet    Sig: Take 1 tablet (50 mg total) by mouth every 8 (eight) hours as needed for moderate pain.    Dispense:  30 tablet    Refill:  0    Immunization History  Administered Date(s) Administered  . Influenza,inj,Quad PF,36+ Mos 10/26/2014    Family History  Problem Relation  Age of Onset  . Diabetes Mother   . Hyperlipidemia Mother   . Heart disease Mother   . Diabetes Maternal Grandmother   . Diabetes Father     History  Substance Use Topics  . Smoking status: Current Some Day Smoker -- 0.25 packs/day for 14 years  . Smokeless tobacco: Not on file     Comment: Smoking .5-1 ppd  . Alcohol Use: 0.0 oz/week    0 Not specified per week    Review of Systems   As noted in HPI  Filed Vitals:   12/17/14 1458  BP: 145/80  Pulse:   Temp:   Resp:     Physical Exam  Physical Exam  Eyes: EOM are normal. Pupils are equal, round, and reactive to light.  Cardiovascular: Normal rate and regular rhythm.   Pulmonary/Chest: Breath sounds normal. No respiratory distress. She has no wheezes. She has no rales.  Musculoskeletal:  Lower back mid line old surgical scar, patient declines to lay down secondary to pain equal strength both lower extremities, DTR2+    CBC    Component Value Date/Time    WBC 13.0* 10/26/2014 1459   RBC 5.21* 10/26/2014 1459   RBC 5.21* 10/26/2014 1459   HGB 10.7* 10/26/2014 1459   HCT 34.8* 10/26/2014 1459   PLT 375 10/26/2014 1459   MCV 66.8* 10/26/2014 1459   LYMPHSABS 2.5 10/26/2014 1459   MONOABS 0.9 10/26/2014 1459   EOSABS 0.1 10/26/2014 1459   BASOSABS 0.1 10/26/2014 1459    CMP     Component Value Date/Time   NA 137 10/26/2014 1459   K 4.3 10/26/2014 1459   CL 101 10/26/2014 1459   CO2 26 10/26/2014 1459   GLUCOSE 89 10/26/2014 1459   BUN 6 10/26/2014 1459   CREATININE 0.72 10/26/2014 1459   CALCIUM 9.4 10/26/2014 1459   PROT 7.4 10/26/2014 1459   ALBUMIN 4.1 10/26/2014 1459   AST 17 10/26/2014 1459   ALT 12 10/26/2014 1459   ALKPHOS 68 10/26/2014 1459   BILITOT 0.6 10/26/2014 1459   GFRNONAA >89 10/26/2014 1459   GFRAA >89 10/26/2014 1459    Lab Results  Component Value Date/Time   CHOL 168 10/26/2014 02:59 PM    No components found for: HGA1C  Lab Results  Component Value Date/Time   AST 17 10/26/2014 02:59 PM    Assessment and Plan  Type 2 diabetes mellitus without complication - Plan:  Results for orders placed or performed in visit on 12/17/14  Glucose (CBG)  Result Value Ref Range   POC Glucose 108 (A) 70 - 99 mg/dl   Last hemoglobin A1c was 6.7%, she has recently started on metformin, advise patient for diabetes meal planning, will repeat A1c in 3 months.  Chronic low back pain - Plan: Ambulatory referral to Orthopedic Surgery, ibuprofen (ADVIL,MOTRIN) 600 MG tablet, cyclobenzaprine (FLEXERIL) 10 MG tablet, traMADol (ULTRAM) 50 MG tablet  History of back surgery - Plan: Ambulatory referral to Orthopedic Surgery  Smoking Advised patient to quit smoking.  Health Maintenance  -Vaccinations: uptodate with the flu shot   Return in about 3 months (around 03/18/2015) for diabetes, hypertension.  Lorayne Marek, MD

## 2014-12-20 ENCOUNTER — Ambulatory Visit: Payer: Self-pay | Attending: Internal Medicine | Admitting: Internal Medicine

## 2014-12-20 ENCOUNTER — Encounter: Payer: Self-pay | Admitting: Internal Medicine

## 2014-12-20 VITALS — BP 158/89 | HR 73 | Temp 98.1°F | Resp 18 | Ht 67.5 in | Wt 262.0 lb

## 2014-12-20 DIAGNOSIS — Z720 Tobacco use: Secondary | ICD-10-CM | POA: Insufficient documentation

## 2014-12-20 DIAGNOSIS — L709 Acne, unspecified: Secondary | ICD-10-CM | POA: Insufficient documentation

## 2014-12-20 DIAGNOSIS — Z01419 Encounter for gynecological examination (general) (routine) without abnormal findings: Secondary | ICD-10-CM | POA: Insufficient documentation

## 2014-12-20 DIAGNOSIS — I10 Essential (primary) hypertension: Secondary | ICD-10-CM | POA: Insufficient documentation

## 2014-12-20 DIAGNOSIS — R739 Hyperglycemia, unspecified: Secondary | ICD-10-CM

## 2014-12-20 DIAGNOSIS — L731 Pseudofolliculitis barbae: Secondary | ICD-10-CM

## 2014-12-20 DIAGNOSIS — Z124 Encounter for screening for malignant neoplasm of cervix: Secondary | ICD-10-CM

## 2014-12-20 DIAGNOSIS — E119 Type 2 diabetes mellitus without complications: Secondary | ICD-10-CM | POA: Insufficient documentation

## 2014-12-20 LAB — GLUCOSE, POCT (MANUAL RESULT ENTRY): POC Glucose: 123 mg/dl — AB (ref 70–99)

## 2014-12-20 NOTE — Progress Notes (Signed)
Patient ID: Karen Tanner, female   DOB: Apr 07, 1971, 43 y.o.   MRN: [id number]  CC: pap/breast exam  HPI: Karen Tanner is a 43 y.o. female here today for a follow up visit.  Patient has past medical history of HTN and T2DM.  She states that she has had a bump on her vaginal region that has been present for a couple of months. She does not perform monthly self breast exam and has never had a mammogram.  Last pap smear has been several years ago which all have been normal. Denies vaginal discharge, itch, odor, or dysuria.  LMP was one week ago.  She has concerns about facial acne and would like a dermatology referral.   Patient has No headache, No chest pain, No abdominal pain - No Nausea, No new weakness tingling or numbness, No Cough - SOB.  No Known Allergies Past Medical History  Diagnosis Date  . Diabetes mellitus without complication   . Hypertension    Current Outpatient Prescriptions on File Prior to Visit  Medication Sig Dispense Refill  . amLODipine (NORVASC) 5 MG tablet Take 1 tablet (5 mg total) by mouth daily. 30 tablet 2  . cyclobenzaprine (FLEXERIL) 10 MG tablet Take 1 tablet (10 mg total) by mouth at bedtime. 30 tablet 3  . ergocalciferol (VITAMIN D2) 50000 UNITS capsule Take 1 capsule (50,000 Units total) by mouth once a week. Take one tablet weekly for 12 weeks 12 capsule 0  . Ferrous Sulfate (IRON) 325 (65 FE) MG TABS Take 1 tablet by mouth 3 (three) times daily. 90 each 2  . ibuprofen (ADVIL,MOTRIN) 600 MG tablet Take 1 tablet (600 mg total) by mouth every 8 (eight) hours as needed. 30 tablet 2  . ketorolac (TORADOL) 10 MG tablet Take 1 tablet (10 mg total) by mouth every 6 (six) hours as needed. for back pain 20 tablet 0  . metFORMIN (GLUCOPHAGE) 500 MG tablet Take 1 tablet (500 mg total) by mouth daily with breakfast. 30 tablet 3  . traMADol (ULTRAM) 50 MG tablet Take 1 tablet (50 mg total) by mouth every 8 (eight) hours as needed for moderate pain. 30 tablet 0   No  current facility-administered medications on file prior to visit.   Family History  Problem Relation Age of Onset  . Diabetes Mother   . Hyperlipidemia Mother   . Heart disease Mother   . Diabetes Maternal Grandmother   . Diabetes Father    History   Social History  . Marital Status: Single    Spouse Name: N/A    Number of Children: N/A  . Years of Education: N/A   Occupational History  . Not on file.   Social History Main Topics  . Smoking status: Current Some Day Smoker -- 0.25 packs/day for 14 years  . Smokeless tobacco: Not on file     Comment: Smoking .5-1 ppd  . Alcohol Use: 0.0 oz/week    0 Not specified per week  . Drug Use: Not on file  . Sexual Activity: Not on file   Other Topics Concern  . Not on file   Social History Narrative    Review of Systems: Constitutional: Negative for fever, chills, diaphoresis, activity change, appetite change and fatigue. HENT: Negative for ear pain, nosebleeds, congestion, facial swelling, rhinorrhea, neck pain, neck stiffness and ear discharge.  Eyes: Negative for pain, discharge, redness, itching and visual disturbance. Respiratory: Negative for cough, choking, chest tightness, shortness of breath, wheezing and stridor.  Cardiovascular:  Negative for chest pain, palpitations and leg swelling. Gastrointestinal: Negative for abdominal distention. Genitourinary: Negative for dysuria, urgency, frequency, hematuria, flank pain, decreased urine volume, difficulty urinating and dyspareunia.  Musculoskeletal: Negative for back pain, joint swelling, arthralgias and gait problem. Neurological: Negative for dizziness, tremors, seizures, syncope, facial asymmetry, speech difficulty, weakness, light-headedness, numbness and headaches.  Hematological: Negative for adenopathy. Does not bruise/bleed easily. Psychiatric/Behavioral: Negative for hallucinations, behavioral problems, confusion, dysphoric mood, decreased concentration and  agitation.    Objective:   Filed Vitals:   12/20/14 1504  BP: 158/89  Pulse: 73  Temp: 98.1 F (36.7 C)  Resp: 18    Physical Exam  Constitutional: She is oriented to person, place, and time.  Cardiovascular: Normal rate, regular rhythm and normal heart sounds.   Pulmonary/Chest: Effort normal and breath sounds normal. Right breast exhibits no mass and no nipple discharge. Left breast exhibits no mass and no nipple discharge.  Genitourinary: Vagina normal and uterus normal. No breast tenderness. Cervix exhibits no motion tenderness, no discharge and no friability. Right adnexum displays no tenderness. Left adnexum displays no tenderness. No vaginal discharge found.  Lymphadenopathy:       Right: No inguinal adenopathy present.       Left: No inguinal adenopathy present.  Neurological: She is alert and oriented to person, place, and time.     Lab Results  Component Value Date   WBC 13.0* 10/26/2014   HGB 10.7* 10/26/2014   HCT 34.8* 10/26/2014   MCV 66.8* 10/26/2014   PLT 375 10/26/2014   Lab Results  Component Value Date   CREATININE 0.72 10/26/2014   BUN 6 10/26/2014   NA 137 10/26/2014   K 4.3 10/26/2014   CL 101 10/26/2014   CO2 26 10/26/2014    Lab Results  Component Value Date   HGBA1C 6.7* 10/26/2014   Lipid Panel     Component Value Date/Time   CHOL 168 10/26/2014 1459   TRIG 96 10/26/2014 1459   HDL 52 10/26/2014 1459   CHOLHDL 3.2 10/26/2014 1459   VLDL 19 10/26/2014 1459   LDLCALC 97 10/26/2014 1459       Assessment and plan:   Karen Tanner was seen today for no specified reason.  Diagnoses and associated orders for this visit:  Papanicolaou smear - Cervicovaginal ancillary only - Cytology - PAP [place] - HIV antibody (with reflex) - RPR - Cervicovaginal ancillary only  Ingrown hair - Ambulatory referral to Gynecology--patient insist on GYN referral for removal. Explained natural ways to get rid of bump  Hyperglycemia - Glucose  (CBG)   Return if symptoms worsen or fail to improve, for with [facility]-PCP.        Chari Manning, NP-C [facility] [phone] 12/20/2014, 3:49 PM

## 2014-12-20 NOTE — Patient Instructions (Signed)

## 2014-12-20 NOTE — Progress Notes (Signed)
Annual pap smear and physicle Possible Cyst around vaginal area

## 2014-12-21 ENCOUNTER — Telehealth: Payer: Self-pay | Admitting: *Deleted

## 2014-12-21 LAB — CERVICOVAGINAL ANCILLARY ONLY
Chlamydia: NEGATIVE
NEISSERIA GONORRHEA: NEGATIVE
WET PREP (BD AFFIRM): NEGATIVE
WET PREP (BD AFFIRM): NEGATIVE
WET PREP (BD AFFIRM): POSITIVE — AB

## 2014-12-21 LAB — HIV ANTIBODY (ROUTINE TESTING W REFLEX): HIV: NONREACTIVE

## 2014-12-21 LAB — CYTOLOGY - PAP

## 2014-12-21 LAB — RPR

## 2014-12-21 MED ORDER — METRONIDAZOLE 500 MG PO TABS
500.0000 mg | ORAL_TABLET | Freq: Two times a day (BID) | ORAL | Status: DC
Start: 2014-12-21 — End: 2016-01-18

## 2014-12-21 NOTE — Telephone Encounter (Signed)
Left message on patient's VM to return call to discuss lab results Flagyl e-scribed to [facility] pharmacy

## 2014-12-21 NOTE — Telephone Encounter (Signed)
-----   Message from Lance Bosch, NP sent at 12/21/2014  1:22 PM EST ----- Patient positive for Bacterial Vaginosis . Please explain this is not a STD, but a imbalance of the vaginal pH. Please send Flagyl 500 mg BID for 7 days. No refills, no alcohol while on this medication.   HIV/Syphilis negative

## 2014-12-28 ENCOUNTER — Telehealth: Payer: Self-pay | Admitting: Internal Medicine

## 2014-12-28 NOTE — Telephone Encounter (Signed)
Pt called to update phone number, please call pt on [phone].

## 2014-12-28 NOTE — Telephone Encounter (Signed)
Patient came into facility to request results, patient states that she did not receive voice mail from nurse and would like to know about her results. Please f/u with pt.

## 2015-01-03 ENCOUNTER — Telehealth: Payer: Self-pay | Admitting: Internal Medicine

## 2015-01-03 NOTE — Telephone Encounter (Signed)
Patient presents to office to request a call from clinic staff for lab results and referrals to [facility] clinic and Orthopedic. Please follow up

## 2015-01-03 NOTE — Telephone Encounter (Signed)
Pt aware of lab results 

## 2015-01-03 NOTE — Telephone Encounter (Signed)
-----   Message from Lance Bosch, NP sent at 12/26/2014  8:01 PM EST ----- Patient pap is negative for malignancies and infections. Will repeat in 3 years.

## 2015-01-04 ENCOUNTER — Telehealth: Payer: Self-pay | Admitting: Internal Medicine

## 2015-01-04 NOTE — Telephone Encounter (Signed)
Information provided to health department

## 2015-01-04 NOTE — Telephone Encounter (Signed)
Public health advisor from the [place] called to inquire about the patients lab results, she would like to know if the patient got tested for syphilis and HIV during her most recent visit and what the results were, she would also like to know the reason for her most recent visit. Please f/u with advisor.

## 2015-01-11 ENCOUNTER — Encounter: Payer: Self-pay | Admitting: Internal Medicine

## 2015-01-11 ENCOUNTER — Ambulatory Visit: Payer: Self-pay | Attending: Internal Medicine | Admitting: Internal Medicine

## 2015-01-11 VITALS — BP 154/95 | HR 84 | Temp 98.0°F | Resp 16 | Ht 67.0 in | Wt 262.0 lb

## 2015-01-11 DIAGNOSIS — E119 Type 2 diabetes mellitus without complications: Secondary | ICD-10-CM

## 2015-01-11 DIAGNOSIS — G8929 Other chronic pain: Secondary | ICD-10-CM | POA: Insufficient documentation

## 2015-01-11 DIAGNOSIS — M545 Low back pain, unspecified: Secondary | ICD-10-CM

## 2015-01-11 DIAGNOSIS — F17210 Nicotine dependence, cigarettes, uncomplicated: Secondary | ICD-10-CM | POA: Insufficient documentation

## 2015-01-11 DIAGNOSIS — I10 Essential (primary) hypertension: Secondary | ICD-10-CM | POA: Insufficient documentation

## 2015-01-11 DIAGNOSIS — Z79899 Other long term (current) drug therapy: Secondary | ICD-10-CM | POA: Insufficient documentation

## 2015-01-11 LAB — GLUCOSE, POCT (MANUAL RESULT ENTRY): POC Glucose: 138 mg/dl — AB (ref 70–99)

## 2015-01-11 NOTE — Progress Notes (Signed)
MRN: [id number] Name: Karen Tanner  Sex: female Age: 44 y.o. DOB: [birth year]  Allergies: Review of patient's allergies indicates no known allergies.  Chief Complaint  Patient presents with  . Follow-up  . Hypertension  . Diabetes    HPI: Patient is 44 y.o. female who history of diabetes, chronic low back pain comes today for follow up, she was referred to orthopedics for the chronic low back pain since he has history of back surgery,as per patient she is taking pain medication , she denies any shooting pain down to the legs, she had x-ray done which reported 1. L5-S1 PLIF without complicating feature, she still complain of on and off pain.   Past Medical History  Diagnosis Date  . Diabetes mellitus without complication   . Hypertension     Past Surgical History  Procedure Laterality Date  . Back surgery      Bone fusion      Medication List       This list is accurate as of: 01/11/15  4:15 PM.  Always use your most recent med list.               amLODipine 5 MG tablet  Commonly known as:  NORVASC  Take 1 tablet (5 mg total) by mouth daily.     cyclobenzaprine 10 MG tablet  Commonly known as:  FLEXERIL  Take 1 tablet (10 mg total) by mouth at bedtime.     ergocalciferol 50000 UNITS capsule  Commonly known as:  VITAMIN D2  Take 1 capsule (50,000 Units total) by mouth once a week. Take one tablet weekly for 12 weeks     ibuprofen 600 MG tablet  Commonly known as:  ADVIL,MOTRIN  Take 1 tablet (600 mg total) by mouth every 8 (eight) hours as needed.     Iron 325 (65 FE) MG Tabs  Take 1 tablet by mouth 3 (three) times daily.     ketorolac 10 MG tablet  Commonly known as:  TORADOL  Take 1 tablet (10 mg total) by mouth every 6 (six) hours as needed. for back pain     metFORMIN 500 MG tablet  Commonly known as:  GLUCOPHAGE  Take 1 tablet (500 mg total) by mouth daily with breakfast.     metroNIDAZOLE 500 MG tablet  Commonly known as:  FLAGYL  Take 1  tablet (500 mg total) by mouth 2 (two) times daily.     traMADol 50 MG tablet  Commonly known as:  ULTRAM  Take 1 tablet (50 mg total) by mouth every 8 (eight) hours as needed for moderate pain.        No orders of the defined types were placed in this encounter.    Immunization History  Administered Date(s) Administered  . Influenza,inj,Quad PF,36+ Mos 10/26/2014    Family History  Problem Relation Age of Onset  . Diabetes Mother   . Hyperlipidemia Mother   . Heart disease Mother   . Diabetes Maternal Grandmother   . Diabetes Father     History  Substance Use Topics  . Smoking status: Current Some Day Smoker -- 0.25 packs/day for 14 years  . Smokeless tobacco: Not on file     Comment: Smoking .5-1 ppd  . Alcohol Use: 0.0 oz/week    0 Not specified per week    Review of Systems   As noted in HPI  Filed Vitals:   01/11/15 1529  BP: 154/95  Pulse: 84  Temp: 98  F (36.7 C)  Resp: 16    Physical Exam  Physical Exam  Constitutional: No distress.  Eyes: EOM are normal. Pupils are equal, round, and reactive to light.  Cardiovascular: Normal rate and regular rhythm.   Pulmonary/Chest: Breath sounds normal. No respiratory distress. She has no wheezes. She has no rales.  Musculoskeletal:  Lower back mid line old surgical scar,  equal strength both lower extremities, DTR2+      CBC    Component Value Date/Time   WBC 13.0* 10/26/2014 1459   RBC 5.21* 10/26/2014 1459   RBC 5.21* 10/26/2014 1459   HGB 10.7* 10/26/2014 1459   HCT 34.8* 10/26/2014 1459   PLT 375 10/26/2014 1459   MCV 66.8* 10/26/2014 1459   LYMPHSABS 2.5 10/26/2014 1459   MONOABS 0.9 10/26/2014 1459   EOSABS 0.1 10/26/2014 1459   BASOSABS 0.1 10/26/2014 1459    CMP     Component Value Date/Time   NA 137 10/26/2014 1459   K 4.3 10/26/2014 1459   CL 101 10/26/2014 1459   CO2 26 10/26/2014 1459   GLUCOSE 89 10/26/2014 1459   BUN 6 10/26/2014 1459   CREATININE 0.72 10/26/2014 1459    CALCIUM 9.4 10/26/2014 1459   PROT 7.4 10/26/2014 1459   ALBUMIN 4.1 10/26/2014 1459   AST 17 10/26/2014 1459   ALT 12 10/26/2014 1459   ALKPHOS 68 10/26/2014 1459   BILITOT 0.6 10/26/2014 1459   GFRNONAA >89 10/26/2014 1459   GFRAA >89 10/26/2014 1459    Lab Results  Component Value Date/Time   CHOL 168 10/26/2014 02:59 PM    No components found for: HGA1C  Lab Results  Component Value Date/Time   AST 17 10/26/2014 02:59 PM    Assessment and Plan  Type 2 diabetes mellitus without complication - Plan:  Results for orders placed or performed in visit on 01/11/15  Glucose (CBG)  Result Value Ref Range   POC Glucose 138.0 (A) 70 - 99 mg/dl   Last hemoglobin A1c was 6.7%, she will continue with metformin advised patient for diabetes meal planning, will repeat A1c in 3 months   Chronic lower back pain - Plan:Currently she is taking ibuprofen, Flexeril, tramadol when necessary  Ambulatory referral to Physical Therapy    Follow up in 3 months   Sherrol Vicars, Vernon Prey, MD

## 2015-01-11 NOTE — Progress Notes (Signed)
Pt saw Chari Manning 12/21 for pap and complained of "bump" in vaginal area  NP felt like the "bump" was an ingrown hair. Pt. States the area is still present and wants referral to have it removed. Pt states she has back trouble and would like referral to ortho Pt reports she has had flu Pt states she was out of her medications for 2 days and just got them filled today Needs refill for Vit D pills

## 2015-01-18 ENCOUNTER — Ambulatory Visit: Payer: No Typology Code available for payment source | Attending: Internal Medicine | Admitting: Physical Therapy

## 2015-02-07 ENCOUNTER — Ambulatory Visit: Payer: No Typology Code available for payment source | Attending: Internal Medicine | Admitting: Physical Therapy

## 2015-02-07 ENCOUNTER — Telehealth: Payer: Self-pay | Admitting: *Deleted

## 2015-02-07 DIAGNOSIS — G8929 Other chronic pain: Secondary | ICD-10-CM | POA: Insufficient documentation

## 2015-02-07 DIAGNOSIS — M545 Low back pain, unspecified: Secondary | ICD-10-CM

## 2015-02-07 DIAGNOSIS — R293 Abnormal posture: Secondary | ICD-10-CM | POA: Insufficient documentation

## 2015-02-07 DIAGNOSIS — G729 Myopathy, unspecified: Secondary | ICD-10-CM | POA: Insufficient documentation

## 2015-02-07 DIAGNOSIS — M6289 Other specified disorders of muscle: Secondary | ICD-10-CM

## 2015-02-07 NOTE — Therapy (Signed)
[place], [place], [address] Phone: [phone]   Fax:  [phone]  Physical Therapy Evaluation  Patient Details  Name: Karen Tanner MRN: [id number] Date of Birth: 08-Jan-1971 Referring Provider:  Lorayne Marek, MD  Encounter Date: 02/07/2015      PT End of Session - 02/07/15 1535    Visit Number 1   Number of Visits 12   Date for PT Re-Evaluation 03/21/15   PT Start Time 0130   PT Stop Time 0232   PT Time Calculation (min) 62 min   Activity Tolerance Patient tolerated treatment well   Behavior During Therapy [facility] for tasks assessed/performed      Past Medical History  Diagnosis Date  . Diabetes mellitus without complication   . Hypertension     Past Surgical History  Procedure Laterality Date  . Back surgery      Bone fusion    There were no vitals taken for this visit.  Visit Diagnosis:  Chronic low back pain  Acute exacerbation of chronic low back pain  Muscle tightness  Poor posture      Subjective Assessment - 02/07/15 1341    Symptoms Chronic low back pain bilaterally in middle into left buttock   Pertinent History had PLIF in 2008 , DM   Limitations Standing;Walking;Sitting  Pt leans to the Right in sitting and standing   How long can you sit comfortably? 30 minutes   How long can you stand comfortably? 5 minutes   How long can you walk comfortably? 5 minutes   Currently in Pain? Yes   Pain Score 10-Worst pain ever   Pain Location Back   Pain Orientation Mid;Lower   Pain Descriptors / Indicators Sharp;Aching;Stabbing  uses a heated back brace most days   Pain Type Chronic pain   Pain Onset More than a month ago   Pain Frequency Constant   Aggravating Factors  lay down too long,  sleep only 4  hours and then get up. household chores 10 minutes,  cant work as a Programmer, applications, havent worked in   Pain Relieving Factors ibuprofen , heat in brace          [facility]  PT Assessment - 02/07/15 1351    Assessment   Medical Diagnosis chronic low back pain   Onset Date 02/07/14   Prior Therapy none   Precautions   Precautions None   Restrictions   Weight Bearing Restrictions No   Home Environment   Living Enviornment Private residence   Living Arrangements Spouse/significant other   Type of [place] to enter   Entrance Stairs-Number of Steps 5   Entrance Stairs-Rails Right   [place] One level   Prior Function   Level of Independence Independent with basic ADLs;Independent with homemaking with ambulation   Cognition   Overall Cognitive Status Within Functional Limits for tasks assessed   Observation/Other Assessments   Observations Pt with Right lateral lean and raised Left pelvic level > than Right   Focus on Therapeutic Outcomes (FOTO)  FOTO limitation 69% predicted 46%   Posture/Postural Control   Posture/Postural Control Postural limitations   Postural Limitations Increased lumbar lordosis;Rounded Shoulders;Forward head;Weight shift right   AROM   Right Hip Extension --  unable to tolerate   Right Hip Flexion 110   Left Hip Extension --  unable to tolerate   Left Hip Flexion 100   Lumbar Flexion 20  ERP  Lumbar Extension 12  ERP   Lumbar - Right Side Bend 10  stiffened spine   Lumbar - Left Side Bend Marked ERP   Lumbar - Right Rotation 55   Lumbar - Left Rotation 45  ERP   Strength   Overall Strength Due to pain;Unable to assess   with resistance   Overall Strength Comments Grossly at least 3/5 with decreased motor control on Left  LE   Flexibility   Hamstrings bilateral 55 with End range tightness   Quadratus Lumborum tightened bilatearlly L> R   Palpation   Palpation Pt with hypersensitve reaction to mid back pain especially over L-4 l5 bil paraspinals  L tenderness over L quadratus lumborum and over Left lumbosacral area   FABER test   findings Positive   Side LEft   Comment pain with mid range  to end range   Slump test   Findings Negative   Side Right   Comment --  and left   Ambulation/Gait   Ambulation/Gait Yes   Ambulation/Gait Assistance 7: Independent   Gait Pattern Decreased step length - right;Decreased stance time - left;Decreased hip/knee flexion - left   Gait velocity 2.17 ft/sec with no assistive device                  [facility] Adult PT Treatment/Exercise - 02/07/15 1351    Posture/Postural Control   Posture Comments Pt with higher left pelvic level than Right  tightend Quadratus Lumborum  Pt instructed on proper sitting and standing posture with handout and demo and VC   Modalities   Modalities Moist Heat;Electrical Stimulation   Moist Heat Therapy   Number Minutes Moist Heat 15 Minutes   Moist Heat Location --  low back   Electrical Stimulation   Electrical Stimulation Location low back   Electrical Stimulation Action IFC   Electrical Stimulation Parameters to pt tolerance   Electrical Stimulation Goals Pain                PT Education - 02/07/15 1534    Education provided Yes   Education Details Pt given information and demo on sitting and standing posture as well as use of heat therapy at home.    Person(s) Educated Patient   Methods Explanation;Demonstration;Verbal cues   Comprehension Verbalized understanding;Returned demonstration;Need further instruction          PT Short Term Goals - 02/07/15 1540    PT SHORT TERM GOAL #1   Title Pt will be independent with initial HEP   Time 2   Period Weeks   Status New   PT SHORT TERM GOAL #2   Title Demonstrate understanding of proper sitting posture and standing posture and be more conscious of position and posture throughout the day   Time 2   Period Weeks   Status New   PT SHORT TERM GOAL #3   Title Pt will be able to stand for 15 minutes with pain level 5/10 or less   Time 6   Period Weeks   Status New           PT Long Term Goals - 02/07/15 1543    PT LONG TERM  GOAL #1   Title Demonstrate and verbalize techniques to reduce the risk of re-injury including  lifting, posture and body mechanics   Time 6   Period Weeks   Status New   PT LONG TERM GOAL #2   Title Pt will be independent with advance HEP  Time 6   Period Weeks   Status New   PT LONG TERM GOAL #3   Title Pt will tolerate standing for 1 hour with pain level 3/10 or less in order to complete household chores/shop for groceries   Time 6   Period Weeks   Status New   PT LONG TERM GOAL #4   Title FOTO limitation will improve from 69% or at least 46% indicating improved functional mobility   Time 6   Period Weeks   Status New   PT LONG TERM GOAL #5   Title Pt will not awaken due to pain in back from movement at night for 5 or more hours of restorative sleep   Time 6   Period Weeks   Status New   Additional Long Term Goals   Additional Long Term Goals Yes   PT LONG TERM GOAL #6   Title Pt will improve Gait velocity to at least 2.62 ft/sec in order to perform activites as community ambulator   Time 6   Period Weeks   Status New               Plan - 02/07/15 1740    Clinical Impression Statement Pt reports exacerbation of chronic low back pain with radiating pain into left buttock limiting her functional mobility and gait less than normal walking speed with right lateral lean . Pt has had PLIF in 2008 for L-5, S-1 and did not receive PT at that time. Pt unable to tolerate liight touch.  Pt was 10/10 at initiation of eval and after treatment with moist heat and IFC 5/10.  Pt has deconditioned body and weakness and would beneifit form skilled PT.   Pt will benefit from skilled therapeutic intervention in order to improve on the following deficits Abnormal gait;Decreased range of motion;Decreased mobility;Increased fascial restricitons;Increased muscle spasms;Decreased strength;Difficulty walking;Impaired flexibility;Improper body mechanics;Postural dysfunction;Obesity;Pain   Rehab  Potential Good   PT Frequency 2x / week   PT Duration 6 weeks   PT Treatment/Interventions Cryotherapy;ADLs/Self Care Home Management;Electrical Stimulation;Moist Heat;Traction;Ultrasound;Therapeutic activities;Functional mobility training;Therapeutic exercise;Neuromuscular re-education;Manual techniques;Patient/family education;Dry needling;Other (comment)  TENS unit/ Kinesion tape   PT Next Visit Plan Begin hamstring stretching and basic back program, abdominal strengthening , posture body mechanics.          Problem List Patient Active Problem List   Diagnosis Date Noted  . History of back surgery 10/26/2014  . History of anemia 10/26/2014  . Essential hypertension 10/26/2014  . Chronic low back pain 10/26/2014  . Menorrhagia with regular cycle 10/26/2014   Voncille Lo, PT 02/07/2015 6:09 PM Phone: [phone] Fax: [phone]  By signing I understand that I am ordering/authorizing the use of Iontophoresis using 4 mg/mL of dexamethasone as a component of this plan of care.  [place], [place], [address] Phone: [phone]   Fax:  [phone]

## 2015-02-07 NOTE — Telephone Encounter (Signed)
appts made and printed...td 

## 2015-02-07 NOTE — Patient Instructions (Addendum)
Posture Tips DO: - stand tall and erect - keep chin tucked in - keep head and shoulders in alignment - check posture regularly in mirror or large window - pull head back against headrest in car seat;  Change your position often.  Sit with lumbar support. DON'T: - slouch or slump while watching TV or reading - sit, stand or lie in one position  for too long;  Sitting is especially hard on the spine so if you sit at a desk/use the computer, then stand up often!   Copyright  VHI. All rights reserved.  Posture - Standing   Good posture is important. Avoid slouching and forward head thrust. Maintain curve in low back and align ears over shoul- ders, hips over ankles.  Pull your belly button in toward your back bone.  Stand with heart to sky a string pulling up up.  Not military  Copyright  VHI. All rights reserved.  Posture - Sitting   Sit upright, head facing forward. Try using a roll to support lower back. Keep shoulders relaxed, and avoid rounded back. Keep hips level with knees. Avoid crossing legs for long periods. Sit on Sit bones not tailbone.   Copyright  VHI. All rights reserved.  Heat Therapy Heat therapy can help make painful, stiff muscles and joints feel better. Do not use heat on new injuries. Wait at least 48 hours after an injury to use heat. Do not use heat when you have aches or pains right after an activity. If you still have pain 3 hours after stopping the activity, then you may use heat. HOME CARE Wet heat pack  Soak a clean towel in warm water. Squeeze out the extra water.  Put the warm, wet towel in a plastic bag.  Place a thin, dry towel between your skin and the bag.  Put the heat pack on the area for 5 minutes, and check your skin. Your skin may be pink, but it should not be red.  Leave the heat pack on the area for 15 to 30 minutes.  Repeat this every 2 to 4 hours while awake. Do not use heat while you are sleeping. Warm water bath  Fill a tub with warm  water.  Place the affected body part in the tub.  Soak the area for 20 to 40 minutes.  Repeat as needed. Hot water bottle  Fill the water bottle half full with hot water.  Press out the extra air. Close the cap tightly.  Place a dry towel between your skin and the bottle.heat  Put the bottle on the area for 5 minutes, and check your skin. Your skin may be pink, but it should not be red.  Leave the bottle on the area for 15 to 30 minutes.  Repeat this every 2 to 4 hours while awake. Electric heating pad  Place a dry towel between your skin and the heating pad.  Set the heating pad on low heat.  Put the heating pad on the area for 10 minutes, and check your skin. Your skin may be pink, but it should not be red.  Leave the heating pad on the area for 20 to 40 minutes.  Repeat this every 2 to 4 hours while awake.  Do not lie on the heating pad.  Do not fall asleep while using the heating pad.  Do not use the heating pad near water. GET HELP RIGHT AWAY IF:  You get blisters or red skin.  Your skin is  puffy (swollen), or you lose feeling (numbness) in the affected area.  You have any new problems.  Your problems are getting worse.  You have any questions or concerns. If you have any problems, stop using heat therapy until you see your doctor. MAKE SURE YOU:  Understand these instructions.  Will watch your condition.  Will get help right away if you are not doing well or get worse. Document Released: 03/10/2012 Document Reviewed: 02/09/2014 [facility] Patient Information 2015 [place]. This information is not intended to replace advice given to you by your health care provider. Make sure you discuss any questions you have with your health care provider.

## 2015-02-15 ENCOUNTER — Ambulatory Visit: Payer: No Typology Code available for payment source | Admitting: Physical Therapy

## 2015-02-15 DIAGNOSIS — M545 Low back pain, unspecified: Secondary | ICD-10-CM

## 2015-02-15 DIAGNOSIS — R293 Abnormal posture: Secondary | ICD-10-CM

## 2015-02-15 DIAGNOSIS — M62830 Muscle spasm of back: Secondary | ICD-10-CM

## 2015-02-15 DIAGNOSIS — G8929 Other chronic pain: Secondary | ICD-10-CM

## 2015-02-15 DIAGNOSIS — M6289 Other specified disorders of muscle: Secondary | ICD-10-CM

## 2015-02-15 NOTE — Patient Instructions (Signed)
Patient given handouts for ADL's Body mechanics and posture and given instruction during treatment Pt also began Core Stabilization exercises and given handout for basic Transversus Abd contraction.   Pt must do in sidelying to start 1)Transversus Abdominis contraction in sidelying  10 sec hold time 10   2)sidelying knee to chest  10 x R and L 3)Clamshell in sidelying 10 x R and L sidelying    4)Pt instructed to stand and extend at lumbar and then rotate to left and follow thigh toward knee 5 x  Every hour.   Also pelvic tilt against wall 5 x as needed throughout day to bring relief.   Abduction: Clam (Eccentric) - Side-Lying   Lie on side with knees bent. Lift top knee, keeping feet together. Keep trunk steady. Slowly lower for 3-5 seconds. 10_ reps per set, _2__ sets per day, __7_ days per week.   Copyright  VHI. All rights reserved.

## 2015-02-15 NOTE — Therapy (Signed)
[place], [place], [address] Phone: [phone]   Fax:  [phone]  Physical Therapy Treatment  Patient Details  Name: Karen Tanner MRN: [id number] Date of Birth: 1971-10-29 Referring Provider:  Lorayne Marek, MD  Encounter Date: 02/15/2015      PT End of Session - 02/15/15 1752    Visit Number 2   Number of Visits 12   Date for PT Re-Evaluation 03/21/15   PT Start Time 1100   PT Stop Time 1202   PT Time Calculation (min) 62 min   Activity Tolerance Patient limited by pain   Behavior During Therapy [facility] for tasks assessed/performed      Past Medical History  Diagnosis Date  . Diabetes mellitus without complication   . Hypertension     Past Surgical History  Procedure Laterality Date  . Back surgery      Bone fusion    There were no vitals taken for this visit.  Visit Diagnosis:  Chronic low back pain  Acute exacerbation of chronic low back pain  Muscle tightness  Poor posture  Spasm of lumbar paraspinous muscle      Subjective Assessment - 02/15/15 1106    Symptoms Chronic low back pain bilaterally in middle into left buttock. Pt walks into clinic with Rt lean and sits with right lean.  Pt confesses she has not done any exercises.  Pt also had not been able to encourporate  any posture into week. Pt states she has been trying to get disability. because she cannot sit or stand or walk more than 5 minutes without  leaning to Right   Pertinent History had PLIF in 2008 , DM   How long can you sit comfortably? 10 minutes  but must lean to right and compensates   How long can you stand comfortably? 5 minutes   Currently in Pain? Yes   Pain Score 8    Pain Location Back   Pain Orientation Mid;Lower;Medial   Pain Onset More than a month ago   Pain Frequency Constant   Aggravating Factors  sitting without support          [facility] PT Assessment - 02/15/15 1114    Palpation   Palpation Pt with hypesensitve reaction to palpation over R                  [facility] Adult PT Treatment/Exercise - 02/15/15 1114    Ambulation/Gait   Ambulation/Gait --   Ambulation/Gait Assistance --   Gait Pattern --   Gait velocity --   Posture/Postural Control   Posture/Postural Control Postural limitations   Postural Limitations Increased lumbar lordosis;Rounded Shoulders;Forward head;Weight shift right   Posture Comments Pt instructed in ADL' posture body mechanics for home use and given handout  and PT demonstrated pain management strategies   Lumbar Exercises: Standing   Other Standing Lumbar Exercises pelvic tilt x 5 against wall   Other Standing Lumbar Exercises rotation to left and extension x 5 with hand down Left thigh   instructed to perform every hour up   Lumbar Exercises: Supine   Ab Set 10 reps  10 seconds hold   AB Set Limitations  pt limited by pain in supine and finished in sidelying position    Lumbar Exercises: Sidelying   Clam 10 reps  x2 with VC in Right and in left   Clam Limitations limited by pain  unable to tolerate in supine   Other Sidelying Lumbar  Exercises knee to chest in Left sidelying 10 x 2   Pt unable to tolerate supine exercises   Modalities   Modalities Moist Heat;Electrical Stimulation   Moist Heat Therapy   Number Minutes Moist Heat 15 Minutes   Moist Heat Location --  low back   Electrical Stimulation   Electrical Stimulation Location low back   Electrical Stimulation Action IFC   Electrical Stimulation Parameters to pt tolerance   Electrical Stimulation Goals Pain   Manual Therapy   Manual Therapy Joint mobilization   Joint Mobilization L4 L5 transverse mob.  Pt very hypersensitive to touch and did not tolerate more than 2 bouts of 90 secs                PT Education - 02/15/15 1751    Education provided Yes   Education Details Pt given written handout on modified standing and sidelying exercises, pelvic tilt on  wall and standing left quadrant rotation/ext, sidelying clam, sidelying knee to chest and Transversus Abdominis contraction   Person(s) Educated Patient   Methods Explanation;Demonstration;Verbal cues;Tactile cues;Handout   Comprehension Verbalized understanding;Returned demonstration;Need further instruction;Verbal cues required          PT Short Term Goals - 02/15/15 1758    PT SHORT TERM GOAL #1   Title Pt will be independent with initial HEP   Time 2   Period Weeks   Status On-going   PT SHORT TERM GOAL #2   Title Demonstrate understanding of proper sitting posture and standing posture and be more conscious of position and posture throughout the day   Time 2   Period Weeks   Status On-going   PT SHORT TERM GOAL #3   Title Pt will be able to stand for 15 minutes with pain level 5/10 or less   Time 2   Period Weeks   Status On-going           PT Long Term Goals - 02/15/15 1758    PT LONG TERM GOAL #1   Title Demonstrate and verbalize techniques to reduce the risk of re-injury including  lifting, posture and body mechanics   Time 6   Period Weeks   Status On-going   PT LONG TERM GOAL #2   Title Pt will be independent with advance HEP   Time 6   Period Weeks   Status On-going   PT LONG TERM GOAL #3   Title Pt will tolerate standing for 1 hour with pain level 3/10 or less in order to complete household chores/shop for groceries   Time 6   Period Weeks   Status On-going   PT LONG TERM GOAL #4   Title FOTO limitation will improve from 69% or at least 46% indicating improved functional mobility   Time 6   Period Weeks   Status On-going   PT LONG TERM GOAL #5   Title Pt will not awaken due to pain in back from movement at night for 5 or more hours of restorative sleep   Time 6   Period Weeks   Status On-going   PT LONG TERM GOAL #6   Title Pt will improve Gait velocity to at least 2.62 ft/sec in order to perform activites as community ambulator   Time 6   Period  Weeks   Status On-going               Plan - 02/15/15 1753    Clinical Impression Statement Pt entered clinic with Right lean  in standing and in sitting.  Pt has 8/10 pain which improved only 7/10 after treatment.  Pt is pursing disabiliy.  Pt is hypersensitive to touch and does not tolerate even mild manual therapy.  Pt unable to toerate supine exercises.  Pt cannot remain on back and must do exercises in sidelying or standing.  Pt received E stim and moist heat in sidelying but did not recieve  much relief.  Pt will continue  PT with emphasis on basic back exercises and abdominal stranegtheining for increaeced lordoisis.    PT Next Visit Plan Instruct in progressive Abdominal core and basic back exercises and pain control for high level of pain.  If patient does not respond to initial HEP, pt may need to return to MD for further work up        Problem List Patient Active Problem List   Diagnosis Date Noted  . History of back surgery 10/26/2014  . History of anemia 10/26/2014  . Essential hypertension 10/26/2014  . Chronic low back pain 10/26/2014  . Menorrhagia with regular cycle 10/26/2014   Voncille Lo, PT 02/15/2015 6:02 PM Phone: [phone] Fax: [place], [place], [address] Phone: [phone]   Fax:  [phone]

## 2015-02-22 ENCOUNTER — Ambulatory Visit: Payer: No Typology Code available for payment source | Admitting: Rehabilitation

## 2015-02-24 ENCOUNTER — Ambulatory Visit: Payer: No Typology Code available for payment source | Admitting: Rehabilitation

## 2015-02-24 DIAGNOSIS — R293 Abnormal posture: Secondary | ICD-10-CM

## 2015-02-24 DIAGNOSIS — M545 Low back pain: Secondary | ICD-10-CM

## 2015-02-24 DIAGNOSIS — G8929 Other chronic pain: Secondary | ICD-10-CM

## 2015-02-24 DIAGNOSIS — M6289 Other specified disorders of muscle: Secondary | ICD-10-CM

## 2015-02-24 DIAGNOSIS — M62830 Muscle spasm of back: Secondary | ICD-10-CM

## 2015-02-24 NOTE — Therapy (Signed)
[place], [place], [address] Phone: [phone]   Fax:  [phone]  Physical Therapy Treatment  Patient Details  Name: Karen Tanner MRN: [id number] Date of Birth: 1971/08/23 Referring Provider:  Lorayne Marek, MD  Encounter Date: 02/24/2015      PT End of Session - 02/24/15 1257    Visit Number 3   Number of Visits 12   Date for PT Re-Evaluation 03/21/15   PT Start Time 1155   PT Stop Time 1240   PT Time Calculation (min) 45 min      Past Medical History  Diagnosis Date  . Diabetes mellitus without complication   . Hypertension     Past Surgical History  Procedure Laterality Date  . Back surgery      Bone fusion    There were no vitals taken for this visit.  Visit Diagnosis:  Muscle tightness  Chronic low back pain  Acute exacerbation of chronic low back pain  Poor posture  Spasm of lumbar paraspinous muscle      Subjective Assessment - 02/24/15 1156    Symptoms Pt reports she has been performing some HEP intermittently. She reports her pain is not as high as the last two visits.    Currently in Pain? Yes   Pain Score 8    Pain Location Back   Pain Orientation Mid;Lower   Pain Descriptors / Indicators Throbbing  intermittent stabbing   Pain Type Chronic pain   Aggravating Factors  sitting without support    Pain Relieving Factors rest, heat                    [facility] Adult PT Treatment/Exercise - 02/24/15 0001    Lumbar Exercises: Supine   Ab Set 10 reps   Clam 10 reps   Bent Knee Raise 5 reps   Isometric Hip Flexion 5 reps   Isometric Hip Flexion Limitations --  unable to perform correctly   Lumbar Exercises: Sidelying   Clam 20 reps   Other Sidelying Lumbar Exercises knee to chest in Left sidelying 10 x 2   Pt unable to tolerate supine exercises   Modalities   Modalities Iontophoresis;Ultrasound   Ultrasound   Ultrasound Location lumbar sacral  spine left to right   Ultrasound Parameters 50% 1.2 w/cm2, 1MHZ   Ultrasound Goals Pain   Iontophoresis   Type of Iontophoresis Dexamethasone   Location L psis, L-S spine    Dose 1.0 c x 2   Time 6 hours   Manual Therapy   Manual Therapy Massage   Massage Gentle soft tissue work to bilateral lumbar paraspinals, QLs to desensitize area. Pt tolerated well in prone position.                PT Education - 02/24/15 1256    Education provided Yes   Education Details Ionto precautions, wear time, expectations   Person(s) Educated Patient   Methods Explanation   Comprehension Verbalized understanding          PT Short Term Goals - 02/15/15 1758    PT SHORT TERM GOAL #1   Title Pt will be independent with initial HEP   Time 2   Period Weeks   Status On-going   PT SHORT TERM GOAL #2   Title Demonstrate understanding of proper sitting posture and standing posture and be more conscious of position and posture throughout the day   Time 2   Period Weeks  Status On-going   PT SHORT TERM GOAL #3   Title Pt will be able to stand for 15 minutes with pain level 5/10 or less   Time 2   Period Weeks   Status On-going           PT Long Term Goals - 02/15/15 1758    PT LONG TERM GOAL #1   Title Demonstrate and verbalize techniques to reduce the risk of re-injury including  lifting, posture and body mechanics   Time 6   Period Weeks   Status On-going   PT LONG TERM GOAL #2   Title Pt will be independent with advance HEP   Time 6   Period Weeks   Status On-going   PT LONG TERM GOAL #3   Title Pt will tolerate standing for 1 hour with pain level 3/10 or less in order to complete household chores/shop for groceries   Time 6   Period Weeks   Status On-going   PT LONG TERM GOAL #4   Title FOTO limitation will improve from 69% or at least 46% indicating improved functional mobility   Time 6   Period Weeks   Status On-going   PT LONG TERM GOAL #5   Title Pt will not  awaken due to pain in back from movement at night for 5 or more hours of restorative sleep   Time 6   Period Weeks   Status On-going   PT LONG TERM GOAL #6   Title Pt will improve Gait velocity to at least 2.62 ft/sec in order to perform activites as community ambulator   Time 6   Period Weeks   Status On-going               Plan - 02/24/15 1249    Clinical Impression Statement Pt enters 10 min late due to transportation. Increased pain with supine exercises and increased pain with legs elevated to place Lspine in neutral. Pt responded well to prone treatment of manual and ultrasound. Also placed Ionto patch to Left PSIS and  mid L-Spine. Pt reports soreness after treatmetn, no increased pain.    PT Next Visit Plan Instruct in progressive Abdominal core and basic back exercises and pain control for high level of pain.  If patient does not respond to initial HEP, pt may need to return to MD for further work up        Problem List Patient Active Problem List   Diagnosis Date Noted  . History of back surgery 10/26/2014  . History of anemia 10/26/2014  . Essential hypertension 10/26/2014  . Chronic low back pain 10/26/2014  . Menorrhagia with regular cycle 10/26/2014    Dorene Ar , PTA  02/24/2015, 12:57 PM  [facility] [address] [place], [place], [address] Phone: [phone]   Fax:  [phone]

## 2015-02-24 NOTE — Patient Instructions (Signed)

## 2015-03-01 ENCOUNTER — Ambulatory Visit: Payer: No Typology Code available for payment source | Attending: Internal Medicine | Admitting: Rehabilitation

## 2015-03-01 DIAGNOSIS — G8929 Other chronic pain: Secondary | ICD-10-CM

## 2015-03-01 DIAGNOSIS — R293 Abnormal posture: Secondary | ICD-10-CM

## 2015-03-01 DIAGNOSIS — M545 Low back pain, unspecified: Secondary | ICD-10-CM

## 2015-03-01 DIAGNOSIS — M6289 Other specified disorders of muscle: Secondary | ICD-10-CM

## 2015-03-01 DIAGNOSIS — M62830 Muscle spasm of back: Secondary | ICD-10-CM

## 2015-03-01 DIAGNOSIS — G729 Myopathy, unspecified: Secondary | ICD-10-CM | POA: Insufficient documentation

## 2015-03-01 NOTE — Patient Instructions (Signed)
Isometric Hold (Quadruped)   On hands and knees, slowly inhale, and then exhale. Pull navel toward spine and Hold for ___ seconds. Continue to breathe in and out during hold. Rest for ___ seconds. Repeat ___ times. Do ___ times a day.   Copyright  VHI. All rights reserved.  Bracing With Arm Raise (Quadruped)   On hands and knees find neutral spine. Tighten pelvic floor and abdominals and hold. Alternately lift arm to shoulder level. Repeat ___ times. Do ___ times a day.

## 2015-03-02 NOTE — Therapy (Signed)
[place], [place], [address] Phone: [phone]   Fax:  [phone]  Physical Therapy Treatment  Patient Details  Name: Karen Tanner MRN: [id number] Date of Birth: Jul 03, 1971 Referring Provider:  Lorayne Marek, MD  Encounter Date: 03/01/2015      PT End of Session - 03/01/15 1205    Visit Number 4   Number of Visits 12   Date for PT Re-Evaluation 03/21/15   PT Start Time 6962      Past Medical History  Diagnosis Date  . Diabetes mellitus without complication   . Hypertension     Past Surgical History  Procedure Laterality Date  . Back surgery      Bone fusion    There were no vitals taken for this visit.  Visit Diagnosis:  Muscle tightness  Chronic low back pain  Acute exacerbation of chronic low back pain  Poor posture  Spasm of lumbar paraspinous muscle      Subjective Assessment - 03/01/15 1148    Symptoms 6/10  today is a good day. I dont feel pain right now while I am sitting. Yesterday I was in a lot of pain. I was hurting extremely bad after I removed the Iontophoresis patches.    Currently in Pain? Yes   Pain Score 6   with prolonged positions or walk/stand   Pain Location Back   Pain Orientation Mid;Lower   Pain Descriptors / Indicators Sharp  with walk/stand   Aggravating Factors  walking and standing    Pain Relieving Factors sitting with lumbar support                    [facility] Adult PT Treatment/Exercise - 03/01/15 1205    Lumbar Exercises: Standing   Other Standing Lumbar Exercises pelvic tilt x 15 against wall with max verbal and tactile cues also demonstration   Lumbar Exercises: Seated   Other Seated Lumbar Exercises pelvic tilt in chair with towel roll at lspine.   Lumbar Exercises: Supine   Ab Set 10 reps  with pelvic tilt   AB Set Limitations PTA placed hand under Lspine for tactile cues. Pt able to progressively  decrease anterior tilt  in supine.    Lumbar Exercises: Sidelying   Clam 20 reps   Hip Abduction 10 reps  with bent knee   Lumbar Exercises: Quadruped   [place]/Old Horse 10 reps  verbal, tactile and demonstration reqd   Madcat/Old Horse Limitations able to perform well and maintain neutral spine with cues   Single Arm Raise 10 reps  difficulty maintaining neutral at first, then able   Modalities   Modalities Electrical Stimulation;Moist Heat   Moist Heat Therapy   Number Minutes Moist Heat 15 Minutes   Moist Heat Location --  lumbar supine   Electrical Stimulation   Electrical Stimulation Location low back   Electrical Stimulation Action IFC   Electrical Stimulation Parameters to pt tolerance   Electrical Stimulation Goals Pain                PT Education - 03/01/15 1232    Education provided Yes   Education Details Quadruped abdominal draw ins with UE raises   Person(s) Educated Patient   Methods Explanation;Handout   Comprehension Verbalized understanding          PT Short Term Goals - 02/15/15 1758    PT SHORT TERM GOAL #1   Title Pt will be independent with initial HEP  Time 2   Period Weeks   Status On-going   PT SHORT TERM GOAL #2   Title Demonstrate understanding of proper sitting posture and standing posture and be more conscious of position and posture throughout the day   Time 2   Period Weeks   Status On-going   PT SHORT TERM GOAL #3   Title Pt will be able to stand for 15 minutes with pain level 5/10 or less   Time 2   Period Weeks   Status On-going           PT Long Term Goals - 02/15/15 1758    PT LONG TERM GOAL #1   Title Demonstrate and verbalize techniques to reduce the risk of re-injury including  lifting, posture and body mechanics   Time 6   Period Weeks   Status On-going   PT LONG TERM GOAL #2   Title Pt will be independent with advance HEP   Time 6   Period Weeks   Status On-going   PT LONG TERM GOAL #3   Title Pt will tolerate standing for  1 hour with pain level 3/10 or less in order to complete household chores/shop for groceries   Time 6   Period Weeks   Status On-going   PT LONG TERM GOAL #4   Title FOTO limitation will improve from 69% or at least 46% indicating improved functional mobility   Time 6   Period Weeks   Status On-going   PT LONG TERM GOAL #5   Title Pt will not awaken due to pain in back from movement at night for 5 or more hours of restorative sleep   Time 6   Period Weeks   Status On-going   PT LONG TERM GOAL #6   Title Pt will improve Gait velocity to at least 2.62 ft/sec in order to perform activites as community ambulator   Time 6   Period Weeks   Status On-going               Plan - 03/01/15 1210    Clinical Impression Statement Able to establish HEP that allows pt to perform pelvic tilt/ neutal spine and maintain it with dynamic UE activity (quadruped). Continued fatigue with hip abduction exercises. Minimal progress toward goals due to high pain levels.    PT Next Visit Plan Continue pelvic tilt, abdominal draw ins in various positions. continue quadruped progression as able.         Problem List Patient Active Problem List   Diagnosis Date Noted  . History of back surgery 10/26/2014  . History of anemia 10/26/2014  . Essential hypertension 10/26/2014  . Chronic low back pain 10/26/2014  . Menorrhagia with regular cycle 10/26/2014    Dorene Ar, PTA 03/02/2015, 2:45 PM  [facility] [address] [place], [place], [address] Phone: [phone]   Fax:  [phone]

## 2015-03-03 ENCOUNTER — Ambulatory Visit: Payer: No Typology Code available for payment source | Admitting: Physical Therapy

## 2015-03-03 DIAGNOSIS — M6289 Other specified disorders of muscle: Secondary | ICD-10-CM

## 2015-03-03 DIAGNOSIS — G8929 Other chronic pain: Secondary | ICD-10-CM

## 2015-03-03 DIAGNOSIS — M545 Low back pain, unspecified: Secondary | ICD-10-CM

## 2015-03-03 DIAGNOSIS — M62830 Muscle spasm of back: Secondary | ICD-10-CM

## 2015-03-03 DIAGNOSIS — R293 Abnormal posture: Secondary | ICD-10-CM

## 2015-03-03 NOTE — Therapy (Signed)
[place], [place], [address] Phone: [phone]   Fax:  [phone]  Physical Therapy Treatment  Patient Details  Name: Karen Tanner MRN: [id number] Date of Birth: [birth year] Referring Provider:  Lorayne Marek, MD  Encounter Date: 03/03/2015      PT End of Session - 03/03/15 1150    Visit Number 5   Number of Visits 12   Date for PT Re-Evaluation 03/21/15   PT Start Time 6222   PT Stop Time 1244   PT Time Calculation (min) 57 min      Past Medical History  Diagnosis Date  . Diabetes mellitus without complication   . Hypertension     Past Surgical History  Procedure Laterality Date  . Back surgery      Bone fusion    There were no vitals taken for this visit.  Visit Diagnosis:  Chronic low back pain  Acute exacerbation of chronic low back pain  Muscle tightness  Poor posture  Spasm of lumbar paraspinous muscle      Subjective Assessment - 03/03/15 1151    Symptoms Pt is 8/10 right now.  She does not sleep in a bed, She sleeps on a sofa or mattress  or what is ever available.  Pt unable to lie on back for exercise. Ionto patches did not help. Pt felt worse after the patches but pt would like to try again in case she moved the wrong way.    Pertinent History had PLIF in 2008 , DM   Limitations Standing;Walking;Sitting   How long can you sit comfortably? 1-2 hours  with a support on back   How long can you stand comfortably? 15   How long can you walk comfortably? 10   Pain Score 8    Pain Location Back   Pain Orientation Mid;Lower   Pain Descriptors / Indicators Throbbing;Sharp   Pain Type Chronic pain   Pain Onset More than a month ago   Pain Frequency Constant          [facility] PT Assessment - 03/03/15 0001    AROM   Right Hip Extension 15  minimal pain 2/10   Right Hip Flexion 110   Left Hip Extension 15   Left Hip Flexion 105   Lumbar Flexion 60  ERP   Lumbar  Extension 19  ERP   Lumbar - Right Side Bend 20  ERP   Lumbar - Left Side Bend 20  ERP   Lumbar - Right Rotation 60   Lumbar - Left Rotation 80                  [facility] Adult PT Treatment/Exercise - 03/03/15 1206    Bed Mobility   Bed Mobility Left Sidelying to Sit   Left Sidelying to Sit 7: Independent  Discussion on proper body mechanics and for sleeping   Posture/Postural Control   Posture/Postural Control Postural limitations   Postural Limitations Increased lumbar lordosis   Posture Comments Pt instructed on sleeping on side and avoidance of stomach sleeping.   Lumbar Exercises: Stretches   Single Knee to Chest Stretch 5 reps  5 second hold   Single Knee to Chest Stretch Limitations Pt was initially unable to lie on back but able with more exercise   Double Knee to Chest Stretch 5 reps   5 sec hold and VC   Lower Trunk Rotation 3 reps;30 seconds  bilateraly with VC  Able to be  on back today   Quadruped Mid Back Stretch 2 reps;30 seconds  required VC and TC and only able to perform 1/2 way   Lumbar Exercises: Standing   Other Standing Lumbar Exercises pelvic tilt against wall 10 x 10 sec hold   Lumbar Exercises: Seated   Other Seated Lumbar Exercises --   Lumbar Exercises: Supine   Ab Set 10 reps  with pelvic tilt   AB Set Limitations Pt able to maintain in supine position   Lumbar Exercises: Sidelying   Clam --   Hip Abduction --   Lumbar Exercises: Quadruped   Madcat/Old Horse 10 reps  verbal, tactile and demonstration reqd   Madcat/Old Horse Limitations able to perform well and maintain neutral spine with cues   Single Arm Raise --   Modalities   Modalities --   Moist Heat Therapy   Moist Heat Location --   Electrical Stimulation   Electrical Stimulation Location --   Electrical Stimulation Goals --   Iontophoresis   Type of Iontophoresis Dexamethasone   Location L and R PSIS   Dose 1.0 cc x 2   Time 6 hours                PT  Education - 03/03/15 1255    Education Details supine flexion stretches HEP given and iontophoresis handout and 2 patches over PSIS   Person(s) Educated Patient   Methods Explanation;Demonstration;Handout   Comprehension Verbalized understanding;Returned demonstration;Verbal cues required          PT Short Term Goals - 03/03/15 1239    PT SHORT TERM GOAL #1   Title Pt will be independent with initial HEP   Time 2   Period Weeks   Status Achieved   PT SHORT TERM GOAL #2   Title Demonstrate understanding of proper sitting posture and standing posture and be more conscious of position and posture throughout the day   Time 2   Period Weeks   Status Achieved   PT SHORT TERM GOAL #3   Title Pt will be able to stand for 15 minutes with pain level 5/10 or less   Time 2   Period Weeks   Status On-going           PT Long Term Goals - 03/03/15 1240    PT LONG TERM GOAL #1   Title Demonstrate and verbalize techniques to reduce the risk of re-injury including  lifting, posture and body mechanics   Time 6   Period Weeks   Status On-going   PT LONG TERM GOAL #2   Title Pt will be independent with advance HEP   Period Weeks   Status On-going   PT LONG TERM GOAL #3   Title Pt will tolerate standing for 1 hour with pain level 3/10 or less in order to complete household chores/shop for groceries   Time 6   Period Weeks   Status On-going   PT LONG TERM GOAL #4   Title FOTO limitation will improve from 69% or at least 46% indicating improved functional mobility   Time 6   Period Weeks   Status On-going   PT LONG TERM GOAL #5   Title Pt will not awaken due to pain in back from movement at night for 5 or more hours of restorative sleep  Pt sleeps on sofa and mattress , not restfully   in transition in living arrangement   Time 6   Period Weeks   Status On-going  PT LONG TERM GOAL #6   Title Pt will improve Gait velocity to at least 2.62 ft/sec in order to perform activites as  community ambulator   Time 6   Period Weeks   Status On-going               Plan - 03/03/15 1250    Clinical Impression Statement Pt enters clinic with 8/10 pain and states she has been travelling to various locations to live and often sleeps on a couch or a mattress on the floot.  The constant moving and alrerating sleep arrangemnts could greatly contribute to increasing back pain.  Pt is aware her increased lordosis worsens with sleeping and she also admits she has had diffiiculty being consistient with exercise.  Pt is aware of making more effort for her health even though she is living in difficult circumstandes.  Her pain improved from 8/10 to 6/10 at end of session.  Pt still with marked pain over Bil PSIS.  Pt given iontophoresis patches to be removed after 6 hours Pt shourt term goals achieved for initial exercis HEP and  awareness of sit and standing posture   Pt will benefit from skilled therapeutic intervention in order to improve on the following deficits Abnormal gait;Decreased range of motion;Decreased mobility;Increased fascial restricitons;Increased muscle spasms;Decreased strength;Difficulty walking;Impaired flexibility;Improper body mechanics;Postural dysfunction;Obesity;Pain   PT Frequency 2x / week   PT Duration 6 weeks   PT Treatment/Interventions Cryotherapy;ADLs/Self Care Home Management;Electrical Stimulation;Moist Heat;Traction;Ultrasound;Therapeutic activities;Functional mobility training;Therapeutic exercise;Neuromuscular re-education;Manual techniques;Patient/family education;Dry needling;Other (comment)   PT Next Visit Plan Now pt is able to lie on back ,, work on Core stabilization and progress as pt is able.   Consulted and Agree with Plan of Care Patient        Problem List Patient Active Problem List   Diagnosis Date Noted  . History of back surgery 10/26/2014  . History of anemia 10/26/2014  . Essential hypertension 10/26/2014  . Chronic low back  pain 10/26/2014  . Menorrhagia with regular cycle 10/26/2014    Voncille Lo, PT 03/03/2015 1:00 PM Phone: [phone] Fax: [facility] [address] [place], [place], [address] Phone: [phone]   Fax:  [phone]

## 2015-03-03 NOTE — Patient Instructions (Addendum)
IONTOPHORESIS PATIENT PRECAUTIONS & CONTRAINDICATIONS:  . Redness under one or both electrodes can occur.  This characterized by a uniform redness that usually disappears within 12 hours of treatment. . Small pinhead size blisters may result in response to the drug.  Contact your physician if the problem persists more than 24 hours. . On rare occasions, iontophoresis therapy can result in temporary skin reactions such as rash, inflammation, irritation or burns.  The skin reactions may be the result of individual sensitivity to the ionic solution used, the condition of the skin at the start of treatment, reaction to the materials in the electrodes, allergies or sensitivity to dexamethasone, or a poor connection between the patch and your skin.  Discontinue using iontophoresis if you have any of these reactions and report to your therapist. . Remove the Patch or electrodes if you have any undue sensation of pain or burning during the treatment and report discomfort to your therapist. . Tell your Therapist if you have had known adverse reactions to the application of electrical current. . If using the Patch, the LED light will turn off when treatment is complete and the patch can be removed.  Approximate treatment time is 1-3 hours.  Remove the patch when light goes off or after 6 hours. . The Patch can be worn during normal activity, however excessive motion where the electrodes have been placed can cause poor contact between the skin and the electrode or uneven electrical current resulting in greater risk of skin irritation. Marland Kitchen Keep out of the reach of children.   . DO NOT use if you have a cardiac pacemaker or any other electrically sensitive implanted device. . DO NOT use if you have a known sensitivity to dexamethasone. . DO NOT use during Magnetic Resonance Imaging (MRI). . DO NOT use over broken or compromised skin (e.g. sunburn, cuts, or acne) due to the increased risk of skin reaction. . DO  NOT SHAVE over the area to be treated:  To establish good contact between the Patch and the skin, excessive hair may be clipped. . DO NOT place the Patch or electrodes on or over your eyes, directly over your heart, or brain. . DO NOT reuse the Patch or electrodes as this may cause burns to occur.  Double Knee to Chest (Flexion)   Gently pull both knees toward chest. Feel stretch in lower back or buttock area. Breathing deeply, Hold 30____ seconds. Repeat __2-3__ times. Do __1-2__ sessions per day.  Knee to Chest (Flexion)   Pull knee toward chest. Feel stretch in lower back or buttock area. Breathing deeply, Hold _30__ seconds. Repeat with other knee. Repeat _2-3__ times. Do __1-2__ sessions per day.  Isometric Abdominal   Lying on back with knees bent, tighten stomach by pressing elbows down. Hold _10___ seconds.  Bring Belly to chest Repeat __10__ times per set. Do __1_ sets per session. Do __3__ sessions per day. May be able to do next to wall standing also  Lumbar Rotation: Caudal - Bilateral (Supine)   Feet and knees together, arms outstretched, rock knees left and right, staying within shoulder distance ( man in picture is going too far) ,relaxing muscles of low back. Perform for 1 minute. Relax. Repeat _3___ times per set.  Do __3__ sessions per day.     Copyright  VHI. All rights reserved.

## 2015-03-08 ENCOUNTER — Ambulatory Visit: Payer: No Typology Code available for payment source | Admitting: Rehabilitation

## 2015-03-08 DIAGNOSIS — M545 Low back pain: Secondary | ICD-10-CM

## 2015-03-08 DIAGNOSIS — G8929 Other chronic pain: Secondary | ICD-10-CM

## 2015-03-08 DIAGNOSIS — M62830 Muscle spasm of back: Secondary | ICD-10-CM

## 2015-03-08 DIAGNOSIS — R293 Abnormal posture: Secondary | ICD-10-CM

## 2015-03-08 DIAGNOSIS — M6289 Other specified disorders of muscle: Secondary | ICD-10-CM

## 2015-03-08 NOTE — Therapy (Signed)
[place], [place], [address] Phone: [phone]   Fax:  [phone]  Physical Therapy Treatment  Patient Details  Name: Karen Tanner MRN: [id number] Date of Birth: 09-23-1971 Referring Provider:  Lorayne Marek, MD  Encounter Date: 03/08/2015      PT End of Session - 03/08/15 1516    Visit Number 6   Number of Visits 12   Date for PT Re-Evaluation 03/21/15   PT Start Time 1205   PT Stop Time 1300   PT Time Calculation (min) 55 min      Past Medical History  Diagnosis Date  . Diabetes mellitus without complication   . Hypertension     Past Surgical History  Procedure Laterality Date  . Back surgery      Bone fusion    There were no vitals taken for this visit.  Visit Diagnosis:  Muscle tightness  Chronic low back pain  Acute exacerbation of chronic low back pain  Poor posture  Spasm of lumbar paraspinous muscle      Subjective Assessment - 03/08/15 1211    Symptoms I almost did not make it. My back hurt so bad this morning. 8/10 now. I am sleeping from place to place at friends and am currently sleeping in a chair.    Currently in Pain? Yes   Pain Score 8    Pain Location Back   Pain Orientation Mid;Lower   Pain Descriptors / Indicators Aching   Pain Frequency Constant   Aggravating Factors  walking and standing   Pain Relieving Factors sitting with lumbar support.                     [facility] Adult PT Treatment/Exercise - 03/08/15 1218    Lumbar Exercises: Aerobic   Stationary Bike Nustep L 3 UE /LE x 5 min cues for posture and abdominal brace   Lumbar Exercises: Prone   Single Arm Raise 10 reps   Single Arm Raises Limitations cues for core bracing, 2 pillows under abdominals/hips to promote neutral spine   Straight Leg Raise 10 reps   Straight Leg Raises Limitations modified to bent knee due to weakness   Other Prone Lumbar Exercises hamstring curls with core  contract   Lumbar Exercises: Quadruped   Madcat/Old Horse 10 reps   Madcat/Old Horse Limitations pt fatigues and requires prone rest break   Single Arm Raise 10 reps  cues for neutral and abdominals drawn in   Straight Leg Raise 10 reps   Straight Leg Raises Limitations cues for neutral, difficulty lifting left without compensations.    Other Quadruped Lumbar Exercises modified qped on elbows then performed hip ext x 10 each cues for abdominal brace   Moist Heat Therapy   Number Minutes Moist Heat 15 Minutes   Moist Heat Location --  lumbar supine   Electrical Stimulation   Electrical Stimulation Location low back   Electrical Stimulation Action IFC   Electrical Stimulation Parameters 25   Electrical Stimulation Goals Pain   Iontophoresis   Type of Iontophoresis Dexamethasone   Location L and R PSIS   Dose 1.0 cc x 2   Time 6 hours                  PT Short Term Goals - 03/03/15 1239    PT SHORT TERM GOAL #1   Title Pt will be independent with initial HEP   Time 2   Period  Weeks   Status Achieved   PT SHORT TERM GOAL #2   Title Demonstrate understanding of proper sitting posture and standing posture and be more conscious of position and posture throughout the day   Time 2   Period Weeks   Status Achieved   PT SHORT TERM GOAL #3   Title Pt will be able to stand for 15 minutes with pain level 5/10 or less   Time 2   Period Weeks   Status On-going           PT Long Term Goals - 03/03/15 1240    PT LONG TERM GOAL #1   Title Demonstrate and verbalize techniques to reduce the risk of re-injury including  lifting, posture and body mechanics   Time 6   Period Weeks   Status On-going   PT LONG TERM GOAL #2   Title Pt will be independent with advance HEP   Period Weeks   Status On-going   PT LONG TERM GOAL #3   Title Pt will tolerate standing for 1 hour with pain level 3/10 or less in order to complete household chores/shop for groceries   Time 6   Period  Weeks   Status On-going   PT LONG TERM GOAL #4   Title FOTO limitation will improve from 69% or at least 46% indicating improved functional mobility   Time 6   Period Weeks   Status On-going   PT LONG TERM GOAL #5   Title Pt will not awaken due to pain in back from movement at night for 5 or more hours of restorative sleep  Pt sleeps on sofa and mattress , not restfully   in transition in living arrangement   Time 6   Period Weeks   Status On-going   PT LONG TERM GOAL #6   Title Pt will improve Gait velocity to at least 2.62 ft/sec in order to perform activites as community ambulator   Time 6   Period Weeks   Status On-going               Plan - 03/08/15 1345    Clinical Impression Statement Continued high levels of pain however pt able to tolerate prone and quadruped strengthening without icreased pain just fatigue.    PT Next Visit Plan If pt is able to lie on back ,, work on Core stabilization and progress as pt is able.        Problem List Patient Active Problem List   Diagnosis Date Noted  . History of back surgery 10/26/2014  . History of anemia 10/26/2014  . Essential hypertension 10/26/2014  . Chronic low back pain 10/26/2014  . Menorrhagia with regular cycle 10/26/2014    Dorene Ar, PTA 03/08/2015, 3:19 PM  [place], [place], [address] Phone: [phone]   Fax:  [phone]

## 2015-03-10 ENCOUNTER — Ambulatory Visit: Payer: No Typology Code available for payment source | Admitting: Rehabilitation

## 2015-03-10 DIAGNOSIS — M545 Low back pain, unspecified: Secondary | ICD-10-CM

## 2015-03-10 DIAGNOSIS — M62830 Muscle spasm of back: Secondary | ICD-10-CM

## 2015-03-10 DIAGNOSIS — G8929 Other chronic pain: Secondary | ICD-10-CM

## 2015-03-10 DIAGNOSIS — R293 Abnormal posture: Secondary | ICD-10-CM

## 2015-03-10 DIAGNOSIS — M6289 Other specified disorders of muscle: Secondary | ICD-10-CM

## 2015-03-10 NOTE — Patient Instructions (Signed)

## 2015-03-10 NOTE — Therapy (Signed)
[place], [place], [address] Phone: [phone]   Fax:  [phone]  Physical Therapy Treatment  Patient Details  Name: Karen Tanner MRN: [id number] Date of Birth: 1971/10/06 Referring Provider:  Lorayne Marek, MD  Encounter Date: 03/10/2015      PT End of Session - 03/10/15 1150    Visit Number 7   Number of Visits 12   Date for PT Re-Evaluation 03/21/15   PT Start Time 1111   PT Stop Time 1210   PT Time Calculation (min) 59 min      Past Medical History  Diagnosis Date  . Diabetes mellitus without complication   . Hypertension     Past Surgical History  Procedure Laterality Date  . Back surgery      Bone fusion    There were no vitals taken for this visit.  Visit Diagnosis:  Chronic low back pain  Acute exacerbation of chronic low back pain  Muscle tightness  Poor posture  Spasm of lumbar paraspinous muscle      Subjective Assessment - 03/10/15 1114    Symptoms I'm not too bad today. 6/10. I think I can try laying on my back today. Its not hurting right now but it will if i do too much.   Currently in Pain? No/denies   Pain Frequency Intermittent                    [facility] Adult PT Treatment/Exercise - 03/10/15 1119    Lumbar Exercises: Stretches   Pelvic Tilt 5 reps;10 seconds   Lumbar Exercises: Supine   Ab Set 10 reps   AB Set Limitations with posterior pelvic tilt   Clam 20 reps   Clam Limitations bilateral   Bent Knee Raise 20 reps   Bent Knee Raise Limitations alternating   Lumbar Exercises: Sidelying   Clam 20 reps   Clam Limitations with abdominals engaged   Hip Abduction 10 reps  with bent knee on right   Lumbar Exercises: Prone   Single Arm Raise 10 reps   Straight Leg Raise 10 reps   Straight Leg Raises Limitations modified to bent knee due to weakness   Other Prone Lumbar Exercises hamstring curls with core contract   Other Prone Lumbar  Exercises prone lying core contract x 10   Lumbar Exercises: Quadruped   Madcat/Old Horse 10 reps   Straight Leg Raises Limitations cues for neutral, difficulty lifting left without compensations.    Modalities   Modalities Electrical Stimulation;Moist Heat   Moist Heat Therapy   Number Minutes Moist Heat 15 Minutes   Moist Heat Location --  lumbar supine   Electrical Stimulation   Electrical Stimulation Location low back   Electrical Stimulation Action IFC   Electrical Stimulation Parameters 25   Electrical Stimulation Goals Pain   Iontophoresis   Type of Iontophoresis Dexamethasone   Location L and R PSIS   Dose 1.0 cc x 2   Time 6 hours     Pt requires maximum cues for tehcnique, neutral spine and abdominal bracing throughout treatment.            PT Education - 03/10/15 1150    Education provided Yes   Education Details Pre pilates   Person(s) Educated Patient   Methods Explanation;Handout   Comprehension Verbalized understanding          PT Short Term Goals - 03/03/15 1239    PT SHORT TERM GOAL #1  Title Pt will be independent with initial HEP   Time 2   Period Weeks   Status Achieved   PT SHORT TERM GOAL #2   Title Demonstrate understanding of proper sitting posture and standing posture and be more conscious of position and posture throughout the day   Time 2   Period Weeks   Status Achieved   PT SHORT TERM GOAL #3   Title Pt will be able to stand for 15 minutes with pain level 5/10 or less   Time 2   Period Weeks   Status On-going           PT Long Term Goals - 03/03/15 1240    PT LONG TERM GOAL #1   Title Demonstrate and verbalize techniques to reduce the risk of re-injury including  lifting, posture and body mechanics   Time 6   Period Weeks   Status On-going   PT LONG TERM GOAL #2   Title Pt will be independent with advance HEP   Period Weeks   Status On-going   PT LONG TERM GOAL #3   Title Pt will tolerate standing for 1 hour with  pain level 3/10 or less in order to complete household chores/shop for groceries   Time 6   Period Weeks   Status On-going   PT LONG TERM GOAL #4   Title FOTO limitation will improve from 69% or at least 46% indicating improved functional mobility   Time 6   Period Weeks   Status On-going   PT LONG TERM GOAL #5   Title Pt will not awaken due to pain in back from movement at night for 5 or more hours of restorative sleep  Pt sleeps on sofa and mattress , not restfully   in transition in living arrangement   Time 6   Period Weeks   Status On-going   PT LONG TERM GOAL #6   Title Pt will improve Gait velocity to at least 2.62 ft/sec in order to perform activites as community ambulator   Time 6   Period Weeks   Status On-going               Plan - 03/10/15 1151    Clinical Impression Statement Pt enters without pain today and is able to tolerate supine lumbar  stabilization exercises. Pt reports increased tension in her back by end of treatment. Encouragement provided on consistency with HEP for maximum carry over. Pt verbalizes understanding.    PT Next Visit Plan If pt is able to lie on back ,, work on Core stabilization and progress as pt is able. check gait velocity and goals        Problem List Patient Active Problem List   Diagnosis Date Noted  . History of back surgery 10/26/2014  . History of anemia 10/26/2014  . Essential hypertension 10/26/2014  . Chronic low back pain 10/26/2014  . Menorrhagia with regular cycle 10/26/2014    Dorene Ar, PTA 03/10/2015, 11:57 AM  [place], [place], [address] Phone: [phone]   Fax:  [phone]

## 2015-03-15 ENCOUNTER — Ambulatory Visit: Payer: No Typology Code available for payment source | Admitting: Rehabilitation

## 2015-03-15 DIAGNOSIS — R293 Abnormal posture: Secondary | ICD-10-CM

## 2015-03-15 DIAGNOSIS — M6289 Other specified disorders of muscle: Secondary | ICD-10-CM

## 2015-03-15 DIAGNOSIS — M62830 Muscle spasm of back: Secondary | ICD-10-CM

## 2015-03-15 DIAGNOSIS — M545 Low back pain: Secondary | ICD-10-CM

## 2015-03-15 DIAGNOSIS — G8929 Other chronic pain: Secondary | ICD-10-CM

## 2015-03-15 NOTE — Patient Instructions (Signed)

## 2015-03-15 NOTE — Therapy (Addendum)
[place], [place], [address] Phone: [phone]   Fax:  [phone]  Physical Therapy Treatment  Patient Details  Name: Karen Tanner MRN: [id number] Date of Birth: May 28, 1971 Referring Provider:  Lorayne Marek, MD  Encounter Date: 03/15/2015      PT End of Session - 03/15/15 1201    Visit Number 8   Number of Visits 12   Date for PT Re-Evaluation 03/21/15   PT Start Time 7989   PT Stop Time 1250   PT Time Calculation (min) 65 min      Past Medical History  Diagnosis Date  . Diabetes mellitus without complication   . Hypertension     Past Surgical History  Procedure Laterality Date  . Back surgery      Bone fusion    There were no vitals filed for this visit.  Visit Diagnosis:  Muscle tightness  Chronic low back pain  Acute exacerbation of chronic low back pain  Poor posture  Spasm of lumbar paraspinous muscle      Subjective Assessment - 03/15/15 1147    Symptoms I am 6/10 today. My pain is different everyday. I still have days of 8-9/10 pain. I am still sleepin gin a chair. I have not being doing much walking /standing so my pain is not as high today.    Aggravating Factors  standing is the worst   Pain Relieving Factors pillow under lumbar spine while sleeping in the chair            [facility] PT Assessment - 03/15/15 1159    Observation/Other Assessments   Focus on Therapeutic Outcomes (FOTO)  FOTO limitation 62% (improved from 69%) predicted 46%   AROM   Lumbar - Right Side Bend 25   Lumbar - Left Side Bend 22   Lumbar - Right Rotation 45   Lumbar - Left Rotation 45   Ambulation/Gait   Gait velocity 2.86 ft/sec                   [facility] Adult PT Treatment/Exercise - 03/15/15 1159    Lumbar Exercises: Aerobic   Stationary Bike Nustep L4 UE/LE x 6 min   Lumbar Exercises: Prone   Other Prone Lumbar Exercises Mutifidus training- contraction poor in prone    Modalities   Modalities Electrical Stimulation;Moist Heat   Moist Heat Therapy   Number Minutes Moist Heat 15 Minutes   Moist Heat Location --  lumbar supine   Electrical Stimulation   Electrical Stimulation Location low back   Electrical Stimulation Action IFC   Electrical Stimulation Parameters 23   Electrical Stimulation Goals Pain   Iontophoresis   Type of Iontophoresis Dexamethasone   Location L and R PSIS   Dose 1.0 cc x 2   Time 6 hours                PT Education - 03/15/15 1435    Education provided Yes   Education Details Self Care: Biomedical scientist Handouts Discussed with Pt.    Person(s) Educated Patient   Methods Explanation;Handout;Demonstration   Comprehension Verbalized understanding          PT Short Term Goals - 03/15/15 1204    PT SHORT TERM GOAL #1   Title Pt will be independent with initial HEP   Time 2   Period Weeks   Status Achieved   PT SHORT TERM GOAL #2   Title Demonstrate understanding of proper  sitting posture and standing posture and be more conscious of position and posture throughout the day   Time 2   Period Weeks   Status Achieved   PT SHORT TERM GOAL #3   Title Pt will be able to stand for 15 minutes with pain level 5/10 or less   Time 2   Period Weeks   Status On-going  5 minutes with 8/10 pain at least           PT Long Term Goals - 03/15/15 1204    PT LONG TERM GOAL #1   Title Demonstrate and verbalize techniques to reduce the risk of re-injury including  lifting, posture and body mechanics   Time 6   Period Weeks   Status Achieved   PT LONG TERM GOAL #2   Title Pt will be independent with advance HEP   Time 6   Period Weeks   Status On-going   PT LONG TERM GOAL #3   Title Pt will tolerate standing for 1 hour with pain level 3/10 or less in order to complete household chores/shop for groceries   Time 6   Period Weeks   Status On-going   PT LONG TERM GOAL #4   Title FOTO limitation will  improve from 69% or at least 46% indicating improved functional mobility   Time 6   Period Weeks   Status On-going   PT LONG TERM GOAL #5   Title Pt will not awaken due to pain in back from movement at night for 5 or more hours of restorative sleep   Time 6   Period Weeks   Status On-going  pt currently sleeping in chair. Only able to sleep few hours at a time   PT LONG TERM GOAL #6   Title Pt will improve Gait velocity to at least 2.62 ft/sec in order to perform activites as community ambulator   Time 6   Period Weeks   Status Achieved               Plan - 03/15/15 1159    Clinical Impression Statement Pt is unable to stand > 5 minutes. Pain increases to 8-10/10 with 5 minutes of standing. Pt verbalizes understanding of proper posture and body mechanics. Gait velocity has improved to 2.49ft/sec  (previously 2.17 ft/sec). LTG #1, #6 Achieved.  Pt feels Elect stim may be increasing her pain at end of sessions. Poor Multifidus activation.    PT Next Visit Plan Review HEP, check multifidus, probable discharge due to limited progress        Problem List Patient Active Problem List   Diagnosis Date Noted  . History of back surgery 10/26/2014  . History of anemia 10/26/2014  . Essential hypertension 10/26/2014  . Chronic low back pain 10/26/2014  . Menorrhagia with regular cycle 10/26/2014    Dorene Ar, PTA 03/15/2015, 2:45 PM  [facility] [address] [place], [place], [address] Phone: [phone]   Fax:  [phone]

## 2015-03-17 ENCOUNTER — Ambulatory Visit: Payer: No Typology Code available for payment source | Admitting: Physical Therapy

## 2015-03-17 DIAGNOSIS — M545 Low back pain, unspecified: Secondary | ICD-10-CM

## 2015-03-17 DIAGNOSIS — G8929 Other chronic pain: Secondary | ICD-10-CM

## 2015-03-17 DIAGNOSIS — M62830 Muscle spasm of back: Secondary | ICD-10-CM

## 2015-03-17 DIAGNOSIS — M6289 Other specified disorders of muscle: Secondary | ICD-10-CM

## 2015-03-17 DIAGNOSIS — R293 Abnormal posture: Secondary | ICD-10-CM

## 2015-03-17 NOTE — Therapy (Signed)
[place], [place], [address] Phone: [phone]   Fax:  [phone]  Physical Therapy Treatment/Discharge  Patient Details  Name: Karen Tanner MRN: [id number] Date of Birth: April 05, 1971 Referring Provider:  Lorayne Marek, MD  Encounter Date: 03/17/2015      PT End of Session - 03/17/15 1303    Visit Number 9   Number of Visits 12   Date for PT Re-Evaluation 03/21/15   PT Start Time 1150   PT Stop Time 1245   PT Time Calculation (min) 55 min   Activity Tolerance Patient limited by pain   Behavior During Therapy [facility] for tasks assessed/performed      Past Medical History  Diagnosis Date  . Diabetes mellitus without complication   . Hypertension     Past Surgical History  Procedure Laterality Date  . Back surgery      Bone fusion    There were no vitals filed for this visit.  Visit Diagnosis:  Chronic low back pain  Acute exacerbation of chronic low back pain  Muscle tightness  Poor posture  Spasm of lumbar paraspinous muscle      Subjective Assessment - 03/17/15 1149    Symptoms I am a 7/10.  I did too much this morning including washing dishes and ADL's  and i am especially tight.  I am still sleeping in a chair. sometimes when I sleep too long.  I got a back brace that is at a friends house that has a heat  pack and ice pack with compression..  I cant sleep longer than 2 hours.  I am miserable.  i can't move in a bed to my temporary living siuation because the people I am staying with does not want me to be there. Pt was increased to 8-9/10 after 5 minutes of standing   Pertinent History had PLIF in 2008 , DM   Limitations Standing;Walking;Sitting   How long can you stand comfortably? 10-15 minutes   How long can you walk comfortably? 10 minutes   Patient Stated Goals relief of pain  to be able to sleep normally, to get back to a functioning life   Currently in Pain? Yes   Pain  Score 7    Pain Location Back   Pain Orientation Mid;Lower   Pain Descriptors / Indicators Aching   Pain Type Chronic pain   Pain Onset More than a month ago   Aggravating Factors  standing hurts the worst but anything I do is constant pain            [facility] PT Assessment - 03/17/15 1205    Observation/Other Assessments   Focus on Therapeutic Outcomes (FOTO)  FOTO limitation 62% (improved from 69%) predicted 46%   AROM   Lumbar - Right Side Bend 25   Lumbar - Left Side Bend 25   Lumbar - Right Rotation 45   Lumbar - Left Rotation 45   Strength   Overall Strength Comments Pt overall deconditioned and difficult to assess due to pain   Right Hip Flexion 4/5   Right Hip Extension 3-/5   Right Hip ABduction 4-/5   Left Hip Flexion 4/5   Left Hip Extension 3-/5                   [facility] Adult PT Treatment/Exercise - 03/17/15 1205    Ambulation/Gait   Gait velocity 2.36 ft/sec  pt is additional pain. today    Posture/Postural  Control   Posture/Postural Control Postural limitations   Postural Limitations Rounded Shoulders;Forward head;Increased lumbar lordosis   Posture Comments Pt able to  verbalize proper posture and body mechanics for ADl's in home.  Pt with poor postural endurance   Lumbar Exercises: Stretches   Pelvic Tilt 5 reps  x2   Lumbar Exercises: Aerobic   Stationary Bike Nustep L4 UE/LE x 6 min   Lumbar Exercises: Supine   Ab Set 10 reps   AB Set Limitations with post pelvic tilt   Clam 10 reps  only right today.   Bent Knee Raise 20 reps   Bent Knee Raise Limitations alternating   Lumbar Exercises: Prone   Single Arm Raise 10 reps   Other Prone Lumbar Exercises Mutifidus training- contraction poor in prone   Other Prone Lumbar Exercises prone with knee flex/SLR/ and knee flexand hip ext bil.  5 reps each side each exercise  poor contraction of Multifidus   Modalities   Modalities Electrical Stimulation;Moist Heat   Moist Heat Therapy   Number  Minutes Moist Heat 15 Minutes   Moist Heat Location --  lumbar supine   Electrical Stimulation   Electrical Stimulation Location low back   Electrical Stimulation Action IFC   Electrical Stimulation Parameters to tolerance 14   Electrical Stimulation Goals Pain   Iontophoresis   Type of Iontophoresis --   Location --   Dose --   Time --                PT Education - 03/17/15 1302    Education provided Yes   Education Details prone multifidus work sheet, review of posture, review of HEP   Person(s) Educated Patient   Methods Explanation;Handout;Demonstration;Verbal cues;Tactile cues   Comprehension Verbalized understanding;Returned demonstration          PT Short Term Goals - 03/17/15 1355    PT SHORT TERM GOAL #1   Title Pt will be independent with initial HEP   Time 2   Period Weeks   Status Achieved   PT SHORT TERM GOAL #2   Title Demonstrate understanding of proper sitting posture and standing posture and be more conscious of position and posture throughout the day   Time 2   Period Weeks   Status Achieved   PT SHORT TERM GOAL #3   Title Pt will be able to stand for 15 minutes with pain level 5/10 or less  Pt pain ranges from 7/10 to 10/10 and cannot stand > 10 to 15 minutes   Time 2   Period Weeks   Status Not Met           PT Long Term Goals - 03/17/15 1356    PT LONG TERM GOAL #1   Title Demonstrate and verbalize techniques to reduce the risk of re-injury including  lifting, posture and body mechanics   Time 6   Period Weeks   Status Achieved   PT LONG TERM GOAL #2   Title Pt will be independent with advance HEP  Pt is independent with basic exercises but cannot handle more challenging exercise   Time 6   Period Weeks   Status Partially Met   PT LONG TERM GOAL #3   Title Pt will tolerate standing for 1 hour with pain level 3/10 or less in order to complete household chores/shop for groceries   Time 6   Period Weeks   Status Not Met   PT  LONG TERM GOAL #4  Title FOTO limitation will improve from 69% or at least 46% indicating improved functional mobility  Pt only reduced to 62%   Time 6   Period Weeks   Status Not Met   PT LONG TERM GOAL #5   Title Pt will not awaken due to pain in back from movement at night for 5 or more hours of restorative sleep  Pt cannot sleep greater than 2 hours a night due to uncertain living situation   Time 6   Period Weeks   Status Not Met   PT LONG TERM GOAL #6   Title Pt will improve Gait velocity to at least 2.62 ft/sec in order to perform activites as community ambulator  Pt slower on D/C day due to pain   Time 6   Period Weeks   Status Achieved               Plan - 03/17/15 1244    Clinical Impression Statement Pt does not have a permanent home and must sleep in chairs or sofas,  Pt is unable to stand greater than 5- 10 minuts without increasing pain. Pt had improved to 2.86 Ft/sec gait velocity but had decreased today to 2.36 ft/sec due to in creased pain after she states she tried to " do too much"  which consist of basic ADL/s Pt unable to complete all goals execpt basic HEP and posture eductation.  Pain remains 7-10/10 and would benefit from being referred to an Orthopedic MD for further evaluation.   this  PT also beleives the pt would benefit from a Education officer, museum to help her find a permanent and better solution for living arrangements.  It  is difficult for this pt to concentrate on self care wehn her basic  needs of food and  shelter are not being met.      PT Next Visit Plan Discharge and refer to MD to order orthopedic referral.  Pt would benefit from a consultation with a licensed Social Worker   Consulted and Agree with Plan of Care Patient        Problem List Patient Active Problem List   Diagnosis Date Noted  . History of back surgery 10/26/2014  . History of anemia 10/26/2014  . Essential hypertension 10/26/2014  . Chronic low back pain 10/26/2014  .  Menorrhagia with regular cycle 10/26/2014    Voncille Lo, PT 03/17/2015 2:08 PM Phone: [phone] Fax: [facility] [place], [place], [address] Phone: [phone]   Fax:  [phone]   PHYSICAL THERAPY DISCHARGE SUMMARY  Visits from Start of Care: 9  Current functional level related to goals / functional outcomes: See goals above   Pt is not making any progress toward pain goals.  Unable to do more than basic exercise and cannot advance due to pain   Remaining deficits: Pain 7-10/10  Pt unable to fire multifdus muscles at a functional level. Pt sleeps in a sofa or couch and travels from place to place to live.  She is unable to have basic needs cared for and is not consistent with exercise   Education / Equipment: [employer] HEP,  She has tried Iontophoresis and E stim and modalities without long term solution Plan: Patient agrees to discharge.  Patient goals were partially met. Patient is being discharged due to lack of progress.  ????? and maintaining same pain level.  Pt would benefit from orthopedic referral

## 2015-03-18 ENCOUNTER — Encounter: Payer: Self-pay | Admitting: Internal Medicine

## 2015-03-18 ENCOUNTER — Ambulatory Visit: Payer: No Typology Code available for payment source | Attending: Internal Medicine | Admitting: Internal Medicine

## 2015-03-18 VITALS — BP 154/90 | HR 71 | Temp 98.0°F | Resp 16 | Wt 264.8 lb

## 2015-03-18 DIAGNOSIS — M545 Low back pain, unspecified: Secondary | ICD-10-CM

## 2015-03-18 DIAGNOSIS — E139 Other specified diabetes mellitus without complications: Secondary | ICD-10-CM

## 2015-03-18 DIAGNOSIS — Z1231 Encounter for screening mammogram for malignant neoplasm of breast: Secondary | ICD-10-CM

## 2015-03-18 DIAGNOSIS — E119 Type 2 diabetes mellitus without complications: Secondary | ICD-10-CM | POA: Insufficient documentation

## 2015-03-18 DIAGNOSIS — D509 Iron deficiency anemia, unspecified: Secondary | ICD-10-CM | POA: Insufficient documentation

## 2015-03-18 DIAGNOSIS — I10 Essential (primary) hypertension: Secondary | ICD-10-CM

## 2015-03-18 DIAGNOSIS — F17210 Nicotine dependence, cigarettes, uncomplicated: Secondary | ICD-10-CM | POA: Insufficient documentation

## 2015-03-18 DIAGNOSIS — Z79899 Other long term (current) drug therapy: Secondary | ICD-10-CM | POA: Insufficient documentation

## 2015-03-18 DIAGNOSIS — G8929 Other chronic pain: Secondary | ICD-10-CM | POA: Insufficient documentation

## 2015-03-18 LAB — POCT GLYCOSYLATED HEMOGLOBIN (HGB A1C): HEMOGLOBIN A1C: 6.8

## 2015-03-18 LAB — GLUCOSE, POCT (MANUAL RESULT ENTRY): POC Glucose: 118 mg/dl — AB (ref 70–99)

## 2015-03-18 MED ORDER — TRAMADOL HCL 50 MG PO TABS: 50.0000 mg | ORAL_TABLET | Freq: Three times a day (TID) | ORAL | Status: DC | PRN

## 2015-03-18 MED ORDER — AMLODIPINE BESYLATE 10 MG PO TABS: 10.0000 mg | ORAL_TABLET | Freq: Every day | ORAL | Status: DC

## 2015-03-18 MED ORDER — IBUPROFEN 600 MG PO TABS: 600.0000 mg | ORAL_TABLET | Freq: Three times a day (TID) | ORAL | Status: DC | PRN

## 2015-03-18 MED ORDER — METFORMIN HCL 500 MG PO TABS: 500.0000 mg | ORAL_TABLET | Freq: Every day | ORAL | Status: DC

## 2015-03-18 NOTE — Progress Notes (Signed)
MRN: [id number] Name: Karen Tanner  Sex: female Age: 44 y.o. DOB: 14-Dec-1971  Allergies: Review of patient's allergies indicates no known allergies.  Chief Complaint  Patient presents with  . Follow-up    HPI: Patient is 44 y.o. female who has history of diabetes hypertension, iron deficiency anemia, chronic low back pain comes today for followup as per patient she was following up with physical therapy and they recommended patient to follow with orthopedics and she needs a referral, patient has been taking her diabetes medication denies any hypoglycemic symptoms she ran out of for the last one week and its refill on her medication her blood pressure is also elevated, denies any headache dizziness chest and shortness of breath,patient is also requesting prescription for pain medication.  Past Medical History  Diagnosis Date  . Diabetes mellitus without complication   . Hypertension     Past Surgical History  Procedure Laterality Date  . Back surgery      Bone fusion      Medication List       This list is accurate as of: 03/18/15  5:01 PM.  Always use your most recent med list.               amLODipine 10 MG tablet  Commonly known as:  NORVASC  Take 1 tablet (10 mg total) by mouth daily.     cyclobenzaprine 10 MG tablet  Commonly known as:  FLEXERIL  Take 1 tablet (10 mg total) by mouth at bedtime.     ibuprofen 600 MG tablet  Commonly known as:  ADVIL,MOTRIN  Take 1 tablet (600 mg total) by mouth every 8 (eight) hours as needed.     Iron 325 (65 FE) MG Tabs  Take 1 tablet by mouth 3 (three) times daily.     ketorolac 10 MG tablet  Commonly known as:  TORADOL  Take 1 tablet (10 mg total) by mouth every 6 (six) hours as needed. for back pain     metFORMIN 500 MG tablet  Commonly known as:  GLUCOPHAGE  Take 1 tablet (500 mg total) by mouth daily with breakfast.     metroNIDAZOLE 500 MG tablet  Commonly known as:  FLAGYL  Take 1 tablet (500 mg total)  by mouth 2 (two) times daily.     traMADol 50 MG tablet  Commonly known as:  ULTRAM  Take 1 tablet (50 mg total) by mouth every 8 (eight) hours as needed for moderate pain.        Meds ordered this encounter  Medications  . amLODipine (NORVASC) 10 MG tablet    Sig: Take 1 tablet (10 mg total) by mouth daily.    Dispense:  30 tablet    Refill:  3  . ibuprofen (ADVIL,MOTRIN) 600 MG tablet    Sig: Take 1 tablet (600 mg total) by mouth every 8 (eight) hours as needed.    Dispense:  30 tablet    Refill:  2  . metFORMIN (GLUCOPHAGE) 500 MG tablet    Sig: Take 1 tablet (500 mg total) by mouth daily with breakfast.    Dispense:  30 tablet    Refill:  3  . traMADol (ULTRAM) 50 MG tablet    Sig: Take 1 tablet (50 mg total) by mouth every 8 (eight) hours as needed for moderate pain.    Dispense:  30 tablet    Refill:  0    Immunization History  Administered Date(s) Administered  .  Influenza,inj,Quad PF,36+ Mos 10/26/2014    Family History  Problem Relation Age of Onset  . Diabetes Mother   . Hyperlipidemia Mother   . Heart disease Mother   . Diabetes Maternal Grandmother   . Diabetes Father     History  Substance Use Topics  . Smoking status: Current Some Day Smoker -- 0.25 packs/day for 14 years  . Smokeless tobacco: Not on file     Comment: Smoking .5-1 ppd  . Alcohol Use: 0.0 oz/week    0 Standard drinks or equivalent per week    Review of Systems   As noted in HPI  Filed Vitals:   03/18/15 1537  BP: 154/90  Pulse: 71  Temp: 98 F (36.7 C)  Resp: 16    Physical Exam  Physical Exam  Eyes: EOM are normal. Pupils are equal, round, and reactive to light.  Cardiovascular: Normal rate and regular rhythm.   Pulmonary/Chest: Breath sounds normal. No respiratory distress. She has no wheezes. She has no rales.  Musculoskeletal:  Lower lumbar paraspinal tenderness    CBC    Component Value Date/Time   WBC 13.0* 10/26/2014 1459   RBC 5.21* 10/26/2014 1459     RBC 5.21* 10/26/2014 1459   HGB 10.7* 10/26/2014 1459   HCT 34.8* 10/26/2014 1459   PLT 375 10/26/2014 1459   MCV 66.8* 10/26/2014 1459   LYMPHSABS 2.5 10/26/2014 1459   MONOABS 0.9 10/26/2014 1459   EOSABS 0.1 10/26/2014 1459   BASOSABS 0.1 10/26/2014 1459    CMP     Component Value Date/Time   NA 137 10/26/2014 1459   K 4.3 10/26/2014 1459   CL 101 10/26/2014 1459   CO2 26 10/26/2014 1459   GLUCOSE 89 10/26/2014 1459   BUN 6 10/26/2014 1459   CREATININE 0.72 10/26/2014 1459   CALCIUM 9.4 10/26/2014 1459   PROT 7.4 10/26/2014 1459   ALBUMIN 4.1 10/26/2014 1459   AST 17 10/26/2014 1459   ALT 12 10/26/2014 1459   ALKPHOS 68 10/26/2014 1459   BILITOT 0.6 10/26/2014 1459   GFRNONAA >89 10/26/2014 1459   GFRAA >89 10/26/2014 1459    Lab Results  Component Value Date/Time   CHOL 168 10/26/2014 02:59 PM    No components found for: HGA1C  Lab Results  Component Value Date/Time   AST 17 10/26/2014 02:59 PM    Assessment and Plan  Other specified diabetes mellitus without complications - Plan:  Results for orders placed or performed in visit on 03/18/15  Glucose (CBG)  Result Value Ref Range   POC Glucose 118.0 (A) 70 - 99 mg/dl  HgB A1c  Result Value Ref Range   Hemoglobin A1C 6.80    Advised patient for diabetes meal planning, continue with current medication, will repeat A1c in 3 monthsGlucose (CBG), HgB A1c, metFORMIN (GLUCOPHAGE) 500 MG tablet  Essential hypertension - Plan: blood pressure is uncontrolled, have increased the dose of Norvasc to 10 mg amLODipine (NORVASC) 10 MG tablet, COMPLETE METABOLIC PANEL WITH GFR  Chronic low back pain - Plan: ibuprofen (ADVIL,MOTRIN) 600 MG tablet, traMADol (ULTRAM) 50 MG tablet  Encounter for screening mammogram for breast cancer - Plan: MM DIGITAL SCREENING BILATERAL  Chronic lower back pain - Plan: Ambulatory referral to Orthopedic Surgery  Anemia, iron deficiency - Plan: patient is currently taking iron pills  one time a day, will repeat CBC with Differential/Platelet   Health Maintenance  -Mammogram:ordered -Vaccinations: Up-to-date with flu shot.  Return in about 3 months (around 06/18/2015)  for diabetes, hypertension, BP check in 2 weeks/Nurse Visit.   This note has been created with Surveyor, quantity. Any transcriptional errors are unintentional.    Lorayne Marek, MD

## 2015-03-18 NOTE — Patient Instructions (Signed)
Diabetes Mellitus and Food It is important for you to manage your blood sugar (glucose) level. Your blood glucose level can be greatly affected by what you eat. Eating healthier foods in the appropriate amounts throughout the day at about the same time each day will help you control your blood glucose level. It can also help slow or prevent worsening of your diabetes mellitus. Healthy eating may even help you improve the level of your blood pressure and reach or maintain a healthy weight.  HOW CAN FOOD AFFECT ME? Carbohydrates Carbohydrates affect your blood glucose level more than any other type of food. Your dietitian will help you determine how many carbohydrates to eat at each meal and teach you how to count carbohydrates. Counting carbohydrates is important to keep your blood glucose at a healthy level, especially if you are using insulin or taking certain medicines for diabetes mellitus. Alcohol Alcohol can cause sudden decreases in blood glucose (hypoglycemia), especially if you use insulin or take certain medicines for diabetes mellitus. Hypoglycemia can be a life-threatening condition. Symptoms of hypoglycemia (sleepiness, dizziness, and disorientation) are similar to symptoms of having too much alcohol.  If your health care provider has given you approval to drink alcohol, do so in moderation and use the following guidelines:  Women should not have more than one drink per day, and men should not have more than two drinks per day. One drink is equal to:  12 oz of beer.  5 oz of wine.  1 oz of hard liquor.  Do not drink on an empty stomach.  Keep yourself hydrated. Have water, diet soda, or unsweetened iced tea.  Regular soda, juice, and other mixers might contain a lot of carbohydrates and should be counted. WHAT FOODS ARE NOT RECOMMENDED? As you make food choices, it is important to remember that all foods are not the same. Some foods have fewer nutrients per serving than other  foods, even though they might have the same number of calories or carbohydrates. It is difficult to get your body what it needs when you eat foods with fewer nutrients. Examples of foods that you should avoid that are high in calories and carbohydrates but low in nutrients include:  Trans fats (most processed foods list trans fats on the Nutrition Facts label).  Regular soda.  Juice.  Candy.  Sweets, such as cake, pie, doughnuts, and cookies.  Fried foods. WHAT FOODS CAN I EAT? Have nutrient-rich foods, which will nourish your body and keep you healthy. The food you should eat also will depend on several factors, including:  The calories you need.  The medicines you take.  Your weight.  Your blood glucose level.  Your blood pressure level.  Your cholesterol level. You also should eat a variety of foods, including:  Protein, such as meat, poultry, fish, tofu, nuts, and seeds (lean animal proteins are best).  Fruits.  Vegetables.  Dairy products, such as milk, cheese, and yogurt (low fat is best).  Breads, grains, pasta, cereal, rice, and beans.  Fats such as olive oil, trans fat-free margarine, canola oil, avocado, and olives. DOES EVERYONE WITH DIABETES MELLITUS HAVE THE SAME MEAL PLAN? Because every person with diabetes mellitus is different, there is not one meal plan that works for everyone. It is very important that you meet with a dietitian who will help you create a meal plan that is just right for you. Document Released: 09/13/2005 Document Revised: 12/22/2013 Document Reviewed: 11/13/2013 [employer] Patient Information 2015 [employer]. This   information is not intended to replace advice given to you by your health care provider. Make sure you discuss any questions you have with your health care provider. DASH Eating Plan DASH stands for "Dietary Approaches to Stop Hypertension." The DASH eating plan is a healthy eating plan that has been shown to reduce high  blood pressure (hypertension). Additional health benefits may include reducing the risk of type 2 diabetes mellitus, heart disease, and stroke. The DASH eating plan may also help with weight loss. WHAT DO I NEED TO KNOW ABOUT THE DASH EATING PLAN? For the DASH eating plan, you will follow these general guidelines:  Choose foods with a percent daily value for sodium of less than 5% (as listed on the food label).  Use salt-free seasonings or herbs instead of table salt or sea salt.  Check with your health care provider or pharmacist before using salt substitutes.  Eat lower-sodium products, often labeled as "lower sodium" or "no salt added."  Eat fresh foods.  Eat more vegetables, fruits, and low-fat dairy products.  Choose whole grains. Look for the word "whole" as the first word in the ingredient list.  Choose fish and skinless chicken or turkey more often than red meat. Limit fish, poultry, and meat to 6 oz (170 g) each day.  Limit sweets, desserts, sugars, and sugary drinks.  Choose heart-healthy fats.  Limit cheese to 1 oz (28 g) per day.  Eat more home-cooked food and less restaurant, buffet, and fast food.  Limit fried foods.  Cook foods using methods other than frying.  Limit canned vegetables. If you do use them, rinse them well to decrease the sodium.  When eating at a restaurant, ask that your food be prepared with less salt, or no salt if possible. WHAT FOODS CAN I EAT? Seek help from a dietitian for individual calorie needs. Grains Whole grain or whole wheat bread. Brown rice. Whole grain or whole wheat pasta. Quinoa, bulgur, and whole grain cereals. Low-sodium cereals. Corn or whole wheat flour tortillas. Whole grain cornbread. Whole grain crackers. Low-sodium crackers. Vegetables Fresh or frozen vegetables (raw, steamed, roasted, or grilled). Low-sodium or reduced-sodium tomato and vegetable juices. Low-sodium or reduced-sodium tomato sauce and paste. Low-sodium  or reduced-sodium canned vegetables.  Fruits All fresh, canned (in natural juice), or frozen fruits. Meat and Other Protein Products Ground beef (85% or leaner), grass-fed beef, or beef trimmed of fat. Skinless chicken or turkey. Ground chicken or turkey. Pork trimmed of fat. All fish and seafood. Eggs. Dried beans, peas, or lentils. Unsalted nuts and seeds. Unsalted canned beans. Dairy Low-fat dairy products, such as skim or 1% milk, 2% or reduced-fat cheeses, low-fat ricotta or cottage cheese, or plain low-fat yogurt. Low-sodium or reduced-sodium cheeses. Fats and Oils Tub margarines without trans fats. Light or reduced-fat mayonnaise and salad dressings (reduced sodium). Avocado. Safflower, olive, or canola oils. Natural peanut or almond butter. Other Unsalted popcorn and pretzels. The items listed above may not be a complete list of recommended foods or beverages. Contact your dietitian for more options. WHAT FOODS ARE NOT RECOMMENDED? Grains White bread. White pasta. White rice. Refined cornbread. Bagels and croissants. Crackers that contain trans fat. Vegetables Creamed or fried vegetables. Vegetables in a cheese sauce. Regular canned vegetables. Regular canned tomato sauce and paste. Regular tomato and vegetable juices. Fruits Dried fruits. Canned fruit in light or heavy syrup. Fruit juice. Meat and Other Protein Products Fatty cuts of meat. Ribs, chicken wings, bacon, sausage, bologna, salami, chitterlings, fatback, hot   dogs, bratwurst, and packaged luncheon meats. Salted nuts and seeds. Canned beans with salt. Dairy Whole or 2% milk, cream, half-and-half, and cream cheese. Whole-fat or sweetened yogurt. Full-fat cheeses or blue cheese. Nondairy creamers and whipped toppings. Processed cheese, cheese spreads, or cheese curds. Condiments Onion and garlic salt, seasoned salt, table salt, and sea salt. Canned and packaged gravies. Worcestershire sauce. Tartar sauce. Barbecue sauce.  Teriyaki sauce. Soy sauce, including reduced sodium. Steak sauce. Fish sauce. Oyster sauce. Cocktail sauce. Horseradish. Ketchup and mustard. Meat flavorings and tenderizers. Bouillon cubes. Hot sauce. Tabasco sauce. Marinades. Taco seasonings. Relishes. Fats and Oils Butter, stick margarine, lard, shortening, ghee, and bacon fat. Coconut, palm kernel, or palm oils. Regular salad dressings. Other Pickles and olives. Salted popcorn and pretzels. The items listed above may not be a complete list of foods and beverages to avoid. Contact your dietitian for more information. WHERE CAN I FIND MORE INFORMATION? [employer]: www.nhlbi.nih.gov/health/health-topics/topics/dash/ Document Released: 12/06/2011 Document Revised: 05/03/2014 Document Reviewed: 10/21/2013 [employer] Patient Information 2015 [employer]. This information is not intended to replace advice given to you by your health care provider. Make sure you discuss any questions you have with your health care provider.  

## 2015-03-18 NOTE — Progress Notes (Signed)
Patient here for follow up on her hypertension and diabetes Patient also needs refills on her medications Patient goes to physical therapy for her lower back pain

## 2015-03-19 LAB — CBC WITH DIFFERENTIAL/PLATELET
BASOS PCT: 1 % (ref 0–1)
Basophils Absolute: 0.1 10*3/uL (ref 0.0–0.1)
Eosinophils Absolute: 0.1 10*3/uL (ref 0.0–0.7)
Eosinophils Relative: 2 % (ref 0–5)
HCT: 43.1 % (ref 36.0–46.0)
Hemoglobin: 14.6 g/dL (ref 12.0–15.0)
Lymphocytes Relative: 28 % (ref 12–46)
Lymphs Abs: 1.8 10*3/uL (ref 0.7–4.0)
MCH: 27.8 pg (ref 26.0–34.0)
MCHC: 33.9 g/dL (ref 30.0–36.0)
MCV: 81.9 fL (ref 78.0–100.0)
MONO ABS: 0.5 10*3/uL (ref 0.1–1.0)
MPV: 9.3 fL (ref 8.6–12.4)
Monocytes Relative: 8 % (ref 3–12)
NEUTROS ABS: 4 10*3/uL (ref 1.7–7.7)
NEUTROS PCT: 61 % (ref 43–77)
Platelets: 253 10*3/uL (ref 150–400)
RBC: 5.26 MIL/uL — ABNORMAL HIGH (ref 3.87–5.11)
RDW: 15.8 % — AB (ref 11.5–15.5)
WBC: 6.6 10*3/uL (ref 4.0–10.5)

## 2015-03-19 LAB — COMPLETE METABOLIC PANEL WITH GFR
ALK PHOS: 68 U/L (ref 39–117)
ALT: 15 U/L (ref 0–35)
AST: 18 U/L (ref 0–37)
Albumin: 4.1 g/dL (ref 3.5–5.2)
BUN: 10 mg/dL (ref 6–23)
CHLORIDE: 101 meq/L (ref 96–112)
CO2: 26 meq/L (ref 19–32)
Calcium: 9.4 mg/dL (ref 8.4–10.5)
Creat: 0.7 mg/dL (ref 0.50–1.10)
GFR, Est African American: >89 mL/min
GFR, Est Non African American: >89 mL/min
GLUCOSE: 106 mg/dL — AB (ref 70–99)
Potassium: 4.5 mEq/L (ref 3.5–5.3)
SODIUM: 137 meq/L (ref 135–145)
Total Bilirubin: 0.4 mg/dL (ref 0.2–1.2)
Total Protein: 7.6 g/dL (ref 6.0–8.3)

## 2015-03-22 ENCOUNTER — Telehealth: Payer: Self-pay

## 2015-03-22 ENCOUNTER — Encounter: Payer: No Typology Code available for payment source | Admitting: Rehabilitation

## 2015-03-22 NOTE — Telephone Encounter (Signed)
Patient is aware of her lab results 

## 2015-03-22 NOTE — Telephone Encounter (Signed)
-----   Message from Karen Marek, MD sent at 03/21/2015  9:19 AM EDT ----- Call and let the  patient know that her anemia is improved, advise patient to continue with iron supplement.

## 2015-03-24 ENCOUNTER — Encounter: Payer: No Typology Code available for payment source | Admitting: Physical Therapy

## 2015-03-29 ENCOUNTER — Encounter: Payer: No Typology Code available for payment source | Admitting: Rehabilitation

## 2015-03-31 ENCOUNTER — Encounter: Payer: No Typology Code available for payment source | Admitting: Rehabilitation

## 2015-04-04 ENCOUNTER — Other Ambulatory Visit: Payer: Self-pay | Admitting: Internal Medicine

## 2015-04-04 ENCOUNTER — Ambulatory Visit (HOSPITAL_COMMUNITY)
Admission: RE | Admit: 2015-04-04 | Discharge: 2015-04-04 | Disposition: A | Discharge status: Home / Self Care | Payer: No Typology Code available for payment source | Source: Ambulatory Visit | Attending: Internal Medicine | Admitting: Internal Medicine

## 2015-04-04 DIAGNOSIS — Z1231 Encounter for screening mammogram for malignant neoplasm of breast: Secondary | ICD-10-CM | POA: Insufficient documentation

## 2015-04-11 ENCOUNTER — Ambulatory Visit: Payer: No Typology Code available for payment source | Attending: Internal Medicine | Admitting: *Deleted

## 2015-04-11 VITALS — BP 124/74 | HR 78 | Temp 97.9°F | Resp 20

## 2015-04-11 DIAGNOSIS — Z0130 Encounter for examination of blood pressure without abnormal findings: Secondary | ICD-10-CM

## 2015-04-11 DIAGNOSIS — G8929 Other chronic pain: Secondary | ICD-10-CM

## 2015-04-11 DIAGNOSIS — M545 Low back pain, unspecified: Secondary | ICD-10-CM

## 2015-04-11 DIAGNOSIS — E1165 Type 2 diabetes mellitus with hyperglycemia: Secondary | ICD-10-CM

## 2015-04-11 MED ORDER — LORATADINE 10 MG PO TABS: 10.0000 mg | ORAL_TABLET | Freq: Every day | ORAL | Status: DC | PRN

## 2015-04-11 MED ORDER — TRAMADOL HCL 50 MG PO TABS: 50.0000 mg | ORAL_TABLET | Freq: Three times a day (TID) | ORAL | Status: DC | PRN

## 2015-04-11 NOTE — Patient Instructions (Addendum)
Diabetes Mellitus and Food It is important for you to manage your blood sugar (glucose) level. Your blood glucose level can be greatly affected by what you eat. Eating healthier foods in the appropriate amounts throughout the day at about the same time each day will help you control your blood glucose level. It can also help slow or prevent worsening of your diabetes mellitus. Healthy eating may even help you improve the level of your blood pressure and reach or maintain a healthy weight.  HOW CAN FOOD AFFECT ME? Carbohydrates Carbohydrates affect your blood glucose level more than any other type of food. Your dietitian will help you determine how many carbohydrates to eat at each meal and teach you how to count carbohydrates. Counting carbohydrates is important to keep your blood glucose at a healthy level, especially if you are using insulin or taking certain medicines for diabetes mellitus. Alcohol Alcohol can cause sudden decreases in blood glucose (hypoglycemia), especially if you use insulin or take certain medicines for diabetes mellitus. Hypoglycemia can be a life-threatening condition. Symptoms of hypoglycemia (sleepiness, dizziness, and disorientation) are similar to symptoms of having too much alcohol.  If your health care provider has given you approval to drink alcohol, do so in moderation and use the following guidelines:  Women should not have more than one drink per day, and men should not have more than two drinks per day. One drink is equal to:  12 oz of beer.  5 oz of wine.  1 oz of hard liquor.  Do not drink on an empty stomach.  Keep yourself hydrated. Have water, diet soda, or unsweetened iced tea.  Regular soda, juice, and other mixers might contain a lot of carbohydrates and should be counted. WHAT FOODS ARE NOT RECOMMENDED? As you make food choices, it is important to remember that all foods are not the same. Some foods have fewer nutrients per serving than other  foods, even though they might have the same number of calories or carbohydrates. It is difficult to get your body what it needs when you eat foods with fewer nutrients. Examples of foods that you should avoid that are high in calories and carbohydrates but low in nutrients include:  Trans fats (most processed foods list trans fats on the Nutrition Facts label).  Regular soda.  Juice.  Candy.  Sweets, such as cake, pie, doughnuts, and cookies.  Fried foods. WHAT FOODS CAN I EAT? Have nutrient-rich foods, which will nourish your body and keep you healthy. The food you should eat also will depend on several factors, including:  The calories you need.  The medicines you take.  Your weight.  Your blood glucose level.  Your blood pressure level.  Your cholesterol level. You also should eat a variety of foods, including:  Protein, such as meat, poultry, fish, tofu, nuts, and seeds (lean animal proteins are best).  Fruits.  Vegetables.  Dairy products, such as milk, cheese, and yogurt (low fat is best).  Breads, grains, pasta, cereal, rice, and beans.  Fats such as olive oil, trans fat-free margarine, canola oil, avocado, and olives. DOES EVERYONE WITH DIABETES MELLITUS HAVE THE SAME MEAL PLAN? Because every person with diabetes mellitus is different, there is not one meal plan that works for everyone. It is very important that you meet with a dietitian who will help you create a meal plan that is just right for you. Document Released: 09/13/2005 Document Revised: 12/22/2013 Document Reviewed: 11/13/2013 [employer] Patient Information 2015 [employer]. This   information is not intended to replace advice given to you by your health care provider. Make sure you discuss any questions you have with your health care provider. Diabetes and Exercise Exercising regularly is important. It is not just about losing weight. It has many health benefits, such as:  Improving your overall  fitness, flexibility, and endurance.  Increasing your bone density.  Helping with weight control.  Decreasing your body fat.  Increasing your muscle strength.  Reducing stress and tension.  Improving your overall health. People with diabetes who exercise gain additional benefits because exercise:  Reduces appetite.  Improves the body's use of blood sugar (glucose).  Helps lower or control blood glucose.  Decreases blood pressure.  Helps control blood lipids (such as cholesterol and triglycerides).  Improves the body's use of the hormone insulin by:  Increasing the body's insulin sensitivity.  Reducing the body's insulin needs.  Decreases the risk for heart disease because exercising:  Lowers cholesterol and triglycerides levels.  Increases the levels of good cholesterol (such as high-density lipoproteins [HDL]) in the body.  Lowers blood glucose levels. YOUR ACTIVITY PLAN  Choose an activity that you enjoy and set realistic goals. Your health care provider or diabetes educator can help you make an activity plan that works for you. Exercise regularly as directed by your health care provider. This includes:  Performing resistance training twice a week such as push-ups, sit-ups, lifting weights, or using resistance bands.  Performing 150 minutes of cardio exercises each week such as walking, running, or playing sports.  Staying active and spending no more than 90 minutes at one time being inactive. Even short bursts of exercise are good for you. Three 10-minute sessions spread throughout the day are just as beneficial as a single 30-minute session. Some exercise ideas include:  Taking the dog for a walk.  Taking the stairs instead of the elevator.  Dancing to your favorite song.  Doing an exercise video.  Doing your favorite exercise with a friend. RECOMMENDATIONS FOR EXERCISING WITH TYPE 1 OR TYPE 2 DIABETES   Check your blood glucose before exercising. If  blood glucose levels are greater than 240 mg/dL, check for urine ketones. Do not exercise if ketones are present.  Avoid injecting insulin into areas of the body that are going to be exercised. For example, avoid injecting insulin into:  The arms when playing tennis.  The legs when jogging.  Keep a record of:  Food intake before and after you exercise.  Expected peak times of insulin action.  Blood glucose levels before and after you exercise.  The type and amount of exercise you have done.  Review your records with your health care provider. Your health care provider will help you to develop guidelines for adjusting food intake and insulin amounts before and after exercising.  If you take insulin or oral hypoglycemic agents, watch for signs and symptoms of hypoglycemia. They include:  Dizziness.  Shaking.  Sweating.  Chills.  Confusion.  Drink plenty of water while you exercise to prevent dehydration or heat stroke. Body water is lost during exercise and must be replaced.  Talk to your health care provider before starting an exercise program to make sure it is safe for you. Remember, almost any type of activity is better than none. Document Released: 03/08/2004 Document Revised: 05/03/2014 Document Reviewed: 05/26/2013 [employer] Patient Information 2015 [employer]. This information is not intended to replace advice given to you by your health care provider. Make sure you discuss any   questions you have with your health care provider. Basic Carbohydrate Counting for Diabetes Mellitus Carbohydrate counting is a method for keeping track of the amount of carbohydrates you eat. Eating carbohydrates naturally increases the level of sugar (glucose) in your blood, so it is important for you to know the amount that is okay for you to have in every meal. Carbohydrate counting helps keep the level of glucose in your blood within normal limits. The amount of carbohydrates allowed is  different for every person. A dietitian can help you calculate the amount that is right for you. Once you know the amount of carbohydrates you can have, you can count the carbohydrates in the foods you want to eat. Carbohydrates are found in the following foods:  Grains, such as breads and cereals.  Dried beans and soy products.  Starchy vegetables, such as potatoes, peas, and corn.  Fruit and fruit juices.  Milk and yogurt.  Sweets and snack foods, such as cake, cookies, candy, chips, soft drinks, and fruit drinks. CARBOHYDRATE COUNTING There are two ways to count the carbohydrates in your food. You can use either of the methods or a combination of both. Reading the "Nutrition Facts" on Packaged Food The "Nutrition Facts" is an area that is included on the labels of almost all packaged food and beverages in the [place]. It includes the serving size of that food or beverage and information about the nutrients in each serving of the food, including the grams (g) of carbohydrate per serving.  Decide the number of servings of this food or beverage that you will be able to eat or drink. Multiply that number of servings by the number of grams of carbohydrate that is listed on the label for that serving. The total will be the amount of carbohydrates you will be having when you eat or drink this food or beverage. Learning Standard Serving Sizes of Food When you eat food that is not packaged or does not include "Nutrition Facts" on the label, you need to measure the servings in order to count the amount of carbohydrates.A serving of most carbohydrate-rich foods contains about 15 g of carbohydrates. The following list includes serving sizes of carbohydrate-rich foods that provide 15 g ofcarbohydrate per serving:   1 slice of bread (1 oz) or 1 six-inch tortilla.    of a hamburger bun or English muffin.  4-6 crackers.   cup unsweetened dry cereal.    cup hot cereal.   cup rice or  pasta.    cup mashed potatoes or  of a large baked potato.  1 cup fresh fruit or one small piece of fruit.    cup canned or frozen fruit or fruit juice.  1 cup milk.   cup plain fat-free yogurt or yogurt sweetened with artificial sweeteners.   cup cooked dried beans or starchy vegetable, such as peas, corn, or potatoes.  Decide the number of standard-size servings that you will eat. Multiply that number of servings by 15 (the grams of carbohydrates in that serving). For example, if you eat 2 cups of strawberries, you will have eaten 2 servings and 30 g of carbohydrates (2 servings x 15 g = 30 g). For foods such as soups and casseroles, in which more than one food is mixed in, you will need to count the carbohydrates in each food that is included. EXAMPLE OF CARBOHYDRATE COUNTING Sample Dinner  3 oz chicken breast.   cup of brown rice.   cup of corn.    1 cup milk.   1 cup strawberries with sugar-free whipped topping.  Carbohydrate Calculation Step 1: Identify the foods that contain carbohydrates:   Rice.   Corn.   Milk.   Strawberries. Step 2:Calculate the number of servings eaten of each:   2 servings of rice.   1 serving of corn.   1 serving of milk.   1 serving of strawberries. Step 3: Multiply each of those number of servings by 15 g:   2 servings of rice x 15 g = 30 g.   1 serving of corn x 15 g = 15 g.   1 serving of milk x 15 g = 15 g.   1 serving of strawberries x 15 g = 15 g. Step 4: Add together all of the amounts to find the total grams of carbohydrates eaten: 30 g + 15 g + 15 g + 15 g = 75 g. Document Released: 12/17/2005 Document Revised: 05/03/2014 Document Reviewed: 11/13/2013 [facility] Patient Information 2015 [place], [place]. This information is not intended to replace advice given to you by your health care provider. Make sure you discuss any questions you have with your health care provider. DASH Eating Plan DASH stands  for "Dietary Approaches to Stop Hypertension." The DASH eating plan is a healthy eating plan that has been shown to reduce high blood pressure (hypertension). Additional health benefits may include reducing the risk of type 2 diabetes mellitus, heart disease, and stroke. The DASH eating plan may also help with weight loss. WHAT DO I NEED TO KNOW ABOUT THE DASH EATING PLAN? For the DASH eating plan, you will follow these general guidelines:  Choose foods with a percent daily value for sodium of less than 5% (as listed on the food label).  Use salt-free seasonings or herbs instead of table salt or sea salt.  Check with your health care provider or pharmacist before using salt substitutes.  Eat lower-sodium products, often labeled as "lower sodium" or "no salt added."  Eat fresh foods.  Eat more vegetables, fruits, and low-fat dairy products.  Choose whole grains. Look for the word "whole" as the first word in the ingredient list.  Choose fish and skinless chicken or [place] more often than red meat. Limit fish, poultry, and meat to 6 oz (170 g) each day.  Limit sweets, desserts, sugars, and sugary drinks.  Choose heart-healthy fats.  Limit cheese to 1 oz (28 g) per day.  Eat more home-cooked food and less restaurant, buffet, and fast food.  Limit fried foods.  Cook foods using methods other than frying.  Limit canned vegetables. If you do use them, rinse them well to decrease the sodium.  When eating at a restaurant, ask that your food be prepared with less salt, or no salt if possible. WHAT FOODS CAN I EAT? Seek help from a dietitian for individual calorie needs. Grains Whole grain or whole wheat bread. Brown rice. Whole grain or whole wheat pasta. Quinoa, bulgur, and whole grain cereals. Low-sodium cereals. Corn or whole wheat flour tortillas. Whole grain cornbread. Whole grain crackers. Low-sodium crackers. Vegetables Fresh or frozen vegetables (raw, steamed, roasted, or  grilled). Low-sodium or reduced-sodium tomato and vegetable juices. Low-sodium or reduced-sodium tomato sauce and paste. Low-sodium or reduced-sodium canned vegetables.  Fruits All fresh, canned (in natural juice), or frozen fruits. Meat and Other Protein Products Ground beef (85% or leaner), grass-fed beef, or beef trimmed of fat. Skinless chicken or [place]. Ground chicken or [place]. Pork trimmed of fat. All  fish and seafood. Eggs. Dried beans, peas, or lentils. Unsalted nuts and seeds. Unsalted canned beans. Dairy Low-fat dairy products, such as skim or 1% milk, 2% or reduced-fat cheeses, low-fat ricotta or cottage cheese, or plain low-fat yogurt. Low-sodium or reduced-sodium cheeses. Fats and Oils Tub margarines without trans fats. Light or reduced-fat mayonnaise and salad dressings (reduced sodium). Avocado. Safflower, olive, or canola oils. Natural peanut or almond butter. Other Unsalted popcorn and pretzels. The items listed above may not be a complete list of recommended foods or beverages. Contact your dietitian for more options. WHAT FOODS ARE NOT RECOMMENDED? Grains White bread. White pasta. White rice. Refined cornbread. Bagels and croissants. Crackers that contain trans fat. Vegetables Creamed or fried vegetables. Vegetables in a cheese sauce. Regular canned vegetables. Regular canned tomato sauce and paste. Regular tomato and vegetable juices. Fruits Dried fruits. Canned fruit in light or heavy syrup. Fruit juice. Meat and Other Protein Products Fatty cuts of meat. Ribs, chicken wings, bacon, sausage, bologna, salami, chitterlings, fatback, hot dogs, bratwurst, and packaged luncheon meats. Salted nuts and seeds. Canned beans with salt. Dairy Whole or 2% milk, cream, half-and-half, and cream cheese. Whole-fat or sweetened yogurt. Full-fat cheeses or blue cheese. Nondairy creamers and whipped toppings. Processed cheese, cheese spreads, or cheese curds. Condiments Onion and garlic  salt, seasoned salt, table salt, and sea salt. Canned and packaged gravies. Worcestershire sauce. Tartar sauce. Barbecue sauce. Teriyaki sauce. Soy sauce, including reduced sodium. Steak sauce. Fish sauce. Oyster sauce. Cocktail sauce. Horseradish. Ketchup and mustard. Meat flavorings and tenderizers. Bouillon cubes. Hot sauce. Tabasco sauce. Marinades. Taco seasonings. Relishes. Fats and Oils Butter, stick margarine, lard, shortening, ghee, and bacon fat. Coconut, palm kernel, or palm oils. Regular salad dressings. Other Pickles and olives. Salted popcorn and pretzels. The items listed above may not be a complete list of foods and beverages to avoid. Contact your dietitian for more information. WHERE CAN I FIND MORE INFORMATION? National Heart, Lung, and Blood Institute: travelstabloid.com Document Released: 12/06/2011 Document Revised: 05/03/2014 Document Reviewed: 10/21/2013 [facility] Patient Information 2015 [place], [place]. This information is not intended to replace advice given to you by your health care provider. Make sure you discuss any questions you have with your health care provider. Smoking Cessation Quitting smoking is important to your health and has many advantages. However, it is not always easy to quit since nicotine is a very addictive drug. Oftentimes, people try 3 times or more before being able to quit. This document explains the best ways for you to prepare to quit smoking. Quitting takes hard work and a lot of effort, but you can do it. ADVANTAGES OF QUITTING SMOKING  You will live longer, feel better, and live better.  Your body will feel the impact of quitting smoking almost immediately.  Within 20 minutes, blood pressure decreases. Your pulse returns to its normal level.  After 8 hours, carbon monoxide levels in the blood return to normal. Your oxygen level increases.  After 24 hours, the chance of having a heart attack starts to  decrease. Your breath, hair, and body stop smelling like smoke.  After 48 hours, damaged nerve endings begin to recover. Your sense of taste and smell improve.  After 72 hours, the body is virtually free of nicotine. Your bronchial tubes relax and breathing becomes easier.  After 2 to 12 weeks, lungs can hold more air. Exercise becomes easier and circulation improves.  The risk of having a heart attack, stroke, cancer, or lung disease is greatly reduced.  After 1 year, the risk of coronary heart disease is cut in half.  After 5 years, the risk of stroke falls to the same as a nonsmoker.  After 10 years, the risk of lung cancer is cut in half and the risk of other cancers decreases significantly.  After 15 years, the risk of coronary heart disease drops, usually to the level of a nonsmoker.  If you are pregnant, quitting smoking will improve your chances of having a healthy baby.  The people you live with, especially any children, will be healthier.  You will have extra money to spend on things other than cigarettes. QUESTIONS TO THINK ABOUT BEFORE ATTEMPTING TO QUIT You may want to talk about your answers with your health care provider.  Why do you want to quit?  If you tried to quit in the past, what helped and what did not?  What will be the most difficult situations for you after you quit? How will you plan to handle them?  Who can help you through the tough times? Your family? Friends? A health care provider?  What pleasures do you get from smoking? What ways can you still get pleasure if you quit? Here are some questions to ask your health care provider:  How can you help me to be successful at quitting?  What medicine do you think would be best for me and how should I take it?  What should I do if I need more help?  What is smoking withdrawal like? How can I get information on withdrawal? GET READY  Set a quit date.  Change your environment by getting rid of all  cigarettes, ashtrays, matches, and lighters in your home, car, or work. Do not let people smoke in your home.  Review your past attempts to quit. Think about what worked and what did not. GET SUPPORT AND ENCOURAGEMENT You have a better chance of being successful if you have help. You can get support in many ways.  Tell your family, friends, and coworkers that you are going to quit and need their support. Ask them not to smoke around you.  Get individual, group, or telephone counseling and support. Programs are available at [employer] and health centers. Call your local health department for information about programs in your area.  Spiritual beliefs and practices may help some smokers quit.  Download a "quit meter" on your computer to keep track of quit statistics, such as how long you have gone without smoking, cigarettes not smoked, and money saved.  Get a self-help book about quitting smoking and staying off tobacco. Geneva yourself from urges to smoke. Talk to someone, go for a walk, or occupy your time with a task.  Change your normal routine. Take a different route to work. Drink tea instead of coffee. Eat breakfast in a different place.  Reduce your stress. Take a hot bath, exercise, or read a book.  Plan something enjoyable to do every day. Reward yourself for not smoking.  Explore interactive web-based programs that specialize in helping you quit. GET MEDICINE AND USE IT CORRECTLY Medicines can help you stop smoking and decrease the urge to smoke. Combining medicine with the above behavioral methods and support can greatly increase your chances of successfully quitting smoking.  Nicotine replacement therapy helps deliver nicotine to your body without the negative effects and risks of smoking. Nicotine replacement therapy includes nicotine gum, lozenges, inhalers, nasal sprays, and skin patches. Some may be  available over-the-counter and  others require a prescription.  Antidepressant medicine helps people abstain from smoking, but how this works is unknown. This medicine is available by prescription.  Nicotinic receptor partial agonist medicine simulates the effect of nicotine in your brain. This medicine is available by prescription. Ask your health care provider for advice about which medicines to use and how to use them based on your health history. Your health care provider will tell you what side effects to look out for if you choose to be on a medicine or therapy. Carefully read the information on the package. Do not use any other product containing nicotine while using a nicotine replacement product.  RELAPSE OR DIFFICULT SITUATIONS Most relapses occur within the first 3 months after quitting. Do not be discouraged if you start smoking again. Remember, most people try several times before finally quitting. You may have symptoms of withdrawal because your body is used to nicotine. You may crave cigarettes, be irritable, feel very hungry, cough often, get headaches, or have difficulty concentrating. The withdrawal symptoms are only temporary. They are strongest when you first quit, but they will go away within 10-14 days. To reduce the chances of relapse, try to:  Avoid drinking alcohol. Drinking lowers your chances of successfully quitting.  Reduce the amount of caffeine you consume. Once you quit smoking, the amount of caffeine in your body increases and can give you symptoms, such as a rapid heartbeat, sweating, and anxiety.  Avoid smokers because they can make you want to smoke.  Do not let weight gain distract you. Many smokers will gain weight when they quit, usually less than 10 pounds. Eat a healthy diet and stay active. You can always lose the weight gained after you quit.  Find ways to improve your mood other than smoking. FOR MORE INFORMATION  www.smokefree.gov  Document Released: 12/11/2001 Document Revised:  05/03/2014 Document Reviewed: 03/27/2012 [facility] Patient Information 2015 [place], [place]. This information is not intended to replace advice given to you by your health care provider. Make sure you discuss any questions you have with your health care provider.

## 2015-04-11 NOTE — Progress Notes (Signed)
Patient presents for BP check Med list reviewed; states taking all meds as directed Patient is not adding salt to foods or cooking with salt. Patient made aware of Mrs Deliah Boston as alternative to salt. Encouraged patient to choose foods with 5% or less of daily value for sodium. Discussed walking 30 minutes per day for exercise. Patient states it is too painful to walk due to chronic back pain; rates 8/10 at present and chronic right knee pain; rates 7/10 at present Requesting refill on tramadol. Note sent to referral specialist to f/u on referral to ortho. Also c/o [facility], with walking Patient denies headaches, blurred vision, chest pain or pressure States nicked skin on top of "bump" in pubic area yesterday while shaving. Has been applying antibiotic ung. Denies fever, chills, N/V. States she's had bump for 1 year and was told by GYN is most likely an ingrown hair  Patient states she is under a lot of stress due to living arrangements and possible move to [place] Patient to speak with LCSW today Also, c/o watery eyes and sneezing States it has been 5-10 years since last eye exam  BP 124/74 P 78  R 20  T 97.9 oral SPO2 96%  Area on pubis: 2 mm X 2 mm abrasion on top of 10 mm X 5 mm induration. Area dry without erythema. Appears to be healing well. Patient denies pain or tenderness upon palpation. Patient instructed to keep site clean and dry, apply antibiotic ointment and cover with band aid. Patient instructed to call back if area worsens or does not improve or if other sx present ie. fever, chills, nausea, vomiting  Per PCP: May refill tramadol Claritin prn (RX e-scribed to [facility] Referral to opthalmology   Patient advised to call for med refills at least 7 days before running out so as not to go without.  Patient aware that she is to f/u with PCP 3 months from last visit (Due 06/18/15)  Patient given literature on DASH Eating Plan, Diabetes and Food, Diabetes and Exercise, Basic Carb  Counting and Smoking Cessation

## 2015-05-06 ENCOUNTER — Ambulatory Visit: Payer: No Typology Code available for payment source | Attending: Internal Medicine

## 2015-05-06 ENCOUNTER — Ambulatory Visit: Payer: No Typology Code available for payment source | Attending: Internal Medicine | Admitting: *Deleted

## 2015-05-06 ENCOUNTER — Telehealth: Payer: Self-pay | Admitting: General Practice

## 2015-05-06 VITALS — BP 179/76 | HR 69 | Temp 98.3°F | Resp 16

## 2015-05-06 DIAGNOSIS — E119 Type 2 diabetes mellitus without complications: Secondary | ICD-10-CM | POA: Insufficient documentation

## 2015-05-06 LAB — GLUCOSE, POCT (MANUAL RESULT ENTRY): POC GLUCOSE: 89 mg/dL (ref 70–99)

## 2015-05-06 NOTE — Telephone Encounter (Signed)
Patient presents to clinic today to inform Provider that her referral has cleared and she has an Network engineer at [employer] on May 12th.

## 2015-05-06 NOTE — Progress Notes (Signed)
Patient ID: Karen Tanner, female   DOB: 24-Nov-1971, 44 y.o.   MRN: [id number]  Patient walked in to clinic saying she was concerned her blood sugar was low because she "felt woozy".  Patient reports she only at breakfast today and has not had any water.    Blood sugar was 89 and blood pressyre was 179/76 with a pulse of 69.  Advised patient to go have and rest, eat something and drink 6-8 8 ounce glasses of water.

## 2015-05-06 NOTE — Addendum Note (Signed)
Addended by: Esperanza Sheets on: 05/06/2015 04:54 PM   Modules accepted: Level of Service

## 2015-05-11 ENCOUNTER — Other Ambulatory Visit: Payer: Self-pay | Admitting: Internal Medicine

## 2015-05-12 ENCOUNTER — Telehealth: Payer: Self-pay | Admitting: Internal Medicine

## 2015-05-12 NOTE — Telephone Encounter (Signed)
Patient has come in today to request 4 medications as well as a way to check her blood sugar levels; patient can be reached at [phone]; please f/u with patient

## 2015-05-13 ENCOUNTER — Other Ambulatory Visit (HOSPITAL_COMMUNITY): Payer: Self-pay | Admitting: Specialist

## 2015-05-13 ENCOUNTER — Other Ambulatory Visit: Payer: Self-pay

## 2015-05-13 DIAGNOSIS — M545 Low back pain: Secondary | ICD-10-CM

## 2015-05-13 MED ORDER — TRUERESULT BLOOD GLUCOSE W/DEVICE KIT
PACK | Status: DC
Start: 2015-05-13 — End: 2016-01-18

## 2015-05-13 MED ORDER — GLUCOSE BLOOD VI STRP: ORAL_STRIP | Status: DC

## 2015-05-13 MED ORDER — TRUEPLUS LANCETS 28G MISC: Status: DC

## 2015-05-25 ENCOUNTER — Other Ambulatory Visit: Payer: Self-pay | Admitting: Internal Medicine

## 2015-06-01 ENCOUNTER — Ambulatory Visit (HOSPITAL_COMMUNITY)
Admission: RE | Admit: 2015-06-01 | Discharge: 2015-06-01 | Disposition: A | Discharge status: Home / Self Care | Payer: No Typology Code available for payment source | Source: Ambulatory Visit | Attending: Specialist | Admitting: Specialist

## 2015-06-01 DIAGNOSIS — M4806 Spinal stenosis, lumbar region: Secondary | ICD-10-CM | POA: Insufficient documentation

## 2015-06-01 DIAGNOSIS — M545 Low back pain: Secondary | ICD-10-CM

## 2015-06-21 ENCOUNTER — Ambulatory Visit: Payer: Self-pay | Attending: Internal Medicine | Admitting: Internal Medicine

## 2015-06-21 ENCOUNTER — Encounter: Payer: Self-pay | Admitting: Internal Medicine

## 2015-06-21 VITALS — BP 138/84 | HR 74 | Temp 98.0°F | Resp 16 | Wt 273.8 lb

## 2015-06-21 DIAGNOSIS — E119 Type 2 diabetes mellitus without complications: Secondary | ICD-10-CM | POA: Insufficient documentation

## 2015-06-21 DIAGNOSIS — G8929 Other chronic pain: Secondary | ICD-10-CM | POA: Insufficient documentation

## 2015-06-21 DIAGNOSIS — E139 Other specified diabetes mellitus without complications: Secondary | ICD-10-CM

## 2015-06-21 DIAGNOSIS — Z79891 Long term (current) use of opiate analgesic: Secondary | ICD-10-CM | POA: Insufficient documentation

## 2015-06-21 DIAGNOSIS — K5900 Constipation, unspecified: Secondary | ICD-10-CM | POA: Insufficient documentation

## 2015-06-21 DIAGNOSIS — Z9111 Patient's noncompliance with dietary regimen: Secondary | ICD-10-CM | POA: Insufficient documentation

## 2015-06-21 DIAGNOSIS — M545 Low back pain: Secondary | ICD-10-CM | POA: Insufficient documentation

## 2015-06-21 DIAGNOSIS — I10 Essential (primary) hypertension: Secondary | ICD-10-CM | POA: Insufficient documentation

## 2015-06-21 DIAGNOSIS — Z87891 Personal history of nicotine dependence: Secondary | ICD-10-CM | POA: Insufficient documentation

## 2015-06-21 LAB — POCT GLYCOSYLATED HEMOGLOBIN (HGB A1C): HEMOGLOBIN A1C: 7.2

## 2015-06-21 LAB — GLUCOSE, POCT (MANUAL RESULT ENTRY): POC Glucose: 94 mg/dl (ref 70–99)

## 2015-06-21 MED ORDER — TRAMADOL HCL 50 MG PO TABS: ORAL_TABLET | ORAL | Status: DC

## 2015-06-21 MED ORDER — AMLODIPINE BESYLATE 10 MG PO TABS: 10.0000 mg | ORAL_TABLET | Freq: Every day | ORAL | Status: DC

## 2015-06-21 MED ORDER — METFORMIN HCL 500 MG PO TABS: 500.0000 mg | ORAL_TABLET | Freq: Every day | ORAL | Status: DC

## 2015-06-21 MED ORDER — MAGNESIUM HYDROXIDE 400 MG/5ML PO SUSP: 5.0000 mL | Freq: Every evening | ORAL | Status: DC | PRN

## 2015-06-21 NOTE — Patient Instructions (Addendum)
Diabetes Mellitus and Food It is important for you to manage your blood sugar (glucose) level. Your blood glucose level can be greatly affected by what you eat. Eating healthier foods in the appropriate amounts throughout the day at about the same time each day will help you control your blood glucose level. It can also help slow or prevent worsening of your diabetes mellitus. Healthy eating may even help you improve the level of your blood pressure and reach or maintain a healthy weight.  HOW CAN FOOD AFFECT ME? Carbohydrates Carbohydrates affect your blood glucose level more than any other type of food. Your dietitian will help you determine how many carbohydrates to eat at each meal and teach you how to count carbohydrates. Counting carbohydrates is important to keep your blood glucose at a healthy level, especially if you are using insulin or taking certain medicines for diabetes mellitus. Alcohol Alcohol can cause sudden decreases in blood glucose (hypoglycemia), especially if you use insulin or take certain medicines for diabetes mellitus. Hypoglycemia can be a life-threatening condition. Symptoms of hypoglycemia (sleepiness, dizziness, and disorientation) are similar to symptoms of having too much alcohol.  If your health care provider has given you approval to drink alcohol, do so in moderation and use the following guidelines:  Women should not have more than one drink per day, and men should not have more than two drinks per day. One drink is equal to:  12 oz of beer.  5 oz of wine.  1 oz of hard liquor.  Do not drink on an empty stomach.  Keep yourself hydrated. Have water, diet soda, or unsweetened iced tea.  Regular soda, juice, and other mixers might contain a lot of carbohydrates and should be counted. WHAT FOODS ARE NOT RECOMMENDED? As you make food choices, it is important to remember that all foods are not the same. Some foods have fewer nutrients per serving than other  foods, even though they might have the same number of calories or carbohydrates. It is difficult to get your body what it needs when you eat foods with fewer nutrients. Examples of foods that you should avoid that are high in calories and carbohydrates but low in nutrients include:  Trans fats (most processed foods list trans fats on the Nutrition Facts label).  Regular soda.  Juice.  Candy.  Sweets, such as cake, pie, doughnuts, and cookies.  Fried foods. WHAT FOODS CAN I EAT? Have nutrient-rich foods, which will nourish your body and keep you healthy. The food you should eat also will depend on several factors, including:  The calories you need.  The medicines you take.  Your weight.  Your blood glucose level.  Your blood pressure level.  Your cholesterol level. You also should eat a variety of foods, including:  Protein, such as meat, poultry, fish, tofu, nuts, and seeds (lean animal proteins are best).  Fruits.  Vegetables.  Dairy products, such as milk, cheese, and yogurt (low fat is best).  Breads, grains, pasta, cereal, rice, and beans.  Fats such as olive oil, trans fat-free margarine, canola oil, avocado, and olives. DOES EVERYONE WITH DIABETES MELLITUS HAVE THE SAME MEAL PLAN? Because every person with diabetes mellitus is different, there is not one meal plan that works for everyone. It is very important that you meet with a dietitian who will help you create a meal plan that is just right for you. Document Released: 09/13/2005 Document Revised: 12/22/2013 Document Reviewed: 11/13/2013 ExitCare Patient Information 2015 [employer]. This   information is not intended to replace advice given to you by your health care provider. Make sure you discuss any questions you have with your health care provider. High-Fiber Diet Fiber is found in fruits, vegetables, and grains. A high-fiber diet encourages the addition of more whole grains, legumes, fruits, and  vegetables in your diet. The recommended amount of fiber for adult males is 38 g per day. For adult females, it is 25 g per day. Pregnant and lactating women should get 28 g of fiber per day. If you have a digestive or bowel problem, ask your caregiver for advice before adding high-fiber foods to your diet. Eat a variety of high-fiber foods instead of only a select few type of foods.  PURPOSE  To increase stool bulk.  To make bowel movements more regular to prevent constipation.  To lower cholesterol.  To prevent overeating. WHEN IS THIS DIET USED?  It may be used if you have constipation and hemorrhoids.  It may be used if you have uncomplicated diverticulosis (intestine condition) and irritable bowel syndrome.  It may be used if you need help with weight management.  It may be used if you want to add it to your diet as a protective measure against atherosclerosis, diabetes, and cancer. SOURCES OF FIBER  Whole-grain breads and cereals.  Fruits, such as apples, oranges, bananas, berries, prunes, and pears.  Vegetables, such as green peas, carrots, sweet potatoes, beets, broccoli, cabbage, spinach, and artichokes.  Legumes, such split peas, soy, lentils.  Almonds. FIBER CONTENT IN FOODS Starches and Grains / Dietary Fiber (g)  Cheerios, 1 cup / 3 g  Corn Flakes cereal, 1 cup / 0.7 g  Rice crispy treat cereal, 1 cup / 0.3 g  Instant oatmeal (cooked),  cup / 2 g  Frosted wheat cereal, 1 cup / 5.1 g  Brown, long-grain rice (cooked), 1 cup / 3.5 g  White, long-grain rice (cooked), 1 cup / 0.6 g  Enriched macaroni (cooked), 1 cup / 2.5 g Legumes / Dietary Fiber (g)  Baked beans (canned, plain, or vegetarian),  cup / 5.2 g  Kidney beans (canned),  cup / 6.8 g  Pinto beans (cooked),  cup / 5.5 g Breads and Crackers / Dietary Fiber (g)  Plain or honey graham crackers, 2 squares / 0.7 g  Saltine crackers, 3 squares / 0.3 g  Plain, salted pretzels, 10 pieces /  1.8 g  Whole-wheat bread, 1 slice / 1.9 g  White bread, 1 slice / 0.7 g  Raisin bread, 1 slice / 1.2 g  Plain bagel, 3 oz / 2 g  Flour tortilla, 1 oz / 0.9 g  Corn tortilla, 1 small / 1.5 g  Hamburger or hotdog bun, 1 small / 0.9 g Fruits / Dietary Fiber (g)  Apple with skin, 1 medium / 4.4 g  Sweetened applesauce,  cup / 1.5 g  Banana,  medium / 1.5 g  Grapes, 10 grapes / 0.4 g  Orange, 1 small / 2.3 g  Raisin, 1.5 oz / 1.6 g  Melon, 1 cup / 1.4 g Vegetables / Dietary Fiber (g)  Green beans (canned),  cup / 1.3 g  Carrots (cooked),  cup / 2.3 g  Broccoli (cooked),  cup / 2.8 g  Peas (cooked),  cup / 4.4 g  Mashed potatoes,  cup / 1.6 g  Lettuce, 1 cup / 0.5 g  Corn (canned),  cup / 1.6 g  Tomato,  cup / 1.1 g Document Released:  12/17/2005 Document Revised: 06/17/2012 Document Reviewed: 03/20/2012 ExitCare Patient Information 2015 [place], [place]. This information is not intended to replace advice given to you by your health care provider. Make sure you discuss any questions you have with your health care provider.

## 2015-06-21 NOTE — Progress Notes (Signed)
MRN: [id number] Name: Karen Tanner  Sex: female Age: 44 y.o. DOB: 05-Aug-1971  Allergies: Review of patient's allergies indicates no known allergies.  Chief Complaint  Patient presents with  . Follow-up    HPI: Patient is 44 y.o. female who has to of diabetes hypertension chronic low back pain comes today for followup requesting refill on her medications, her hemoglobin A1c has trended up compared to last visit, she has not been compliant with low carbohydrate diet,patient is also complaining of constipation she does not drink plenty of fluid. Patient denies any nausea vomiting, she is also scheduled to follow with orthopedics for chronic back pain .  Past Medical History  Diagnosis Date  . Diabetes mellitus without complication   . Hypertension     Past Surgical History  Procedure Laterality Date  . Back surgery      Bone fusion      Medication List       This list is accurate as of: 06/21/15  4:35 PM.  Always use your most recent med list.               amLODipine 10 MG tablet  Commonly known as:  NORVASC  Take 1 tablet (10 mg total) by mouth daily.     cyclobenzaprine 10 MG tablet  Commonly known as:  FLEXERIL  Take 1 tablet (10 mg total) by mouth at bedtime.     ferrous sulfate 325 (65 FE) MG tablet  TAKE 1 TABLET BY MOUTH 3 TIMES DAILY     glucose blood test strip  Commonly known as:  EQL TRUETEST TEST  Check blood sugar TID & QHS     ibuprofen 600 MG tablet  Commonly known as:  ADVIL,MOTRIN  TAKE 1 TABLET BY MOUTH EVERY 8 HOURS AS NEEDED     ketorolac 10 MG tablet  Commonly known as:  TORADOL  Take 1 tablet (10 mg total) by mouth every 6 (six) hours as needed. for back pain     loratadine 10 MG tablet  Commonly known as:  CLARITIN  Take 1 tablet (10 mg total) by mouth daily as needed for allergies.     magnesium hydroxide 400 MG/5ML suspension  Commonly known as:  MILK OF MAGNESIA  Take 5 mLs by mouth at bedtime as needed for mild  constipation.     metFORMIN 500 MG tablet  Commonly known as:  GLUCOPHAGE  Take 1 tablet (500 mg total) by mouth daily with breakfast.     metroNIDAZOLE 500 MG tablet  Commonly known as:  FLAGYL  Take 1 tablet (500 mg total) by mouth 2 (two) times daily.     traMADol 50 MG tablet  Commonly known as:  ULTRAM  TAKE 1 TABLET BY MOUTH EVERY 8 HOURS AS NEEDED FOR MODERATE PAIN.     TRUEPLUS LANCETS 28G Misc  Check blood sugar TID & QHS     TRUERESULT BLOOD GLUCOSE W/DEVICE Kit  Check blood sugar TID & QHS        Meds ordered this encounter  Medications  . amLODipine (NORVASC) 10 MG tablet    Sig: Take 1 tablet (10 mg total) by mouth daily.    Dispense:  30 tablet    Refill:  3  . metFORMIN (GLUCOPHAGE) 500 MG tablet    Sig: Take 1 tablet (500 mg total) by mouth daily with breakfast.    Dispense:  30 tablet    Refill:  3  . traMADol (ULTRAM) 50 MG  tablet    Sig: TAKE 1 TABLET BY MOUTH EVERY 8 HOURS AS NEEDED FOR MODERATE PAIN.    Dispense:  30 tablet    Refill:  0  . magnesium hydroxide (MILK OF MAGNESIA) 400 MG/5ML suspension    Sig: Take 5 mLs by mouth at bedtime as needed for mild constipation.    Dispense:  360 mL    Refill:  0    Immunization History  Administered Date(s) Administered  . Influenza,inj,Quad PF,36+ Mos 10/26/2014    Family History  Problem Relation Age of Onset  . Diabetes Mother   . Hyperlipidemia Mother   . Heart disease Mother   . Diabetes Maternal Grandmother   . Diabetes Father     History  Substance Use Topics  . Smoking status: Former Smoker -- 0.25 packs/day for 14 years  . Smokeless tobacco: Not on file     Comment: Smoking 6-7 cigs per day  . Alcohol Use: 0.0 oz/week    0 Standard drinks or equivalent per week    Review of Systems   As noted in HPI  Filed Vitals:   06/21/15 1448  BP: 138/84  Pulse: 74  Temp: 98 F (36.7 C)  Resp: 16    Physical Exam  Physical Exam  Constitutional: No distress.  Eyes: EOM are  normal. Pupils are equal, round, and reactive to light.  Cardiovascular: Normal rate and regular rhythm.   Pulmonary/Chest: Breath sounds normal. No respiratory distress. She has no wheezes. She has no rales.  Abdominal: Soft. There is no tenderness. There is no rebound.    CBC    Component Value Date/Time   WBC 6.6 03/18/2015 1603   RBC 5.26* 03/18/2015 1603   RBC 5.21* 10/26/2014 1459   HGB 14.6 03/18/2015 1603   HCT 43.1 03/18/2015 1603   PLT 253 03/18/2015 1603   MCV 81.9 03/18/2015 1603   LYMPHSABS 1.8 03/18/2015 1603   MONOABS 0.5 03/18/2015 1603   EOSABS 0.1 03/18/2015 1603   BASOSABS 0.1 03/18/2015 1603    CMP     Component Value Date/Time   NA 137 03/18/2015 1603   K 4.5 03/18/2015 1603   CL 101 03/18/2015 1603   CO2 26 03/18/2015 1603   GLUCOSE 106* 03/18/2015 1603   BUN 10 03/18/2015 1603   CREATININE 0.70 03/18/2015 1603   CALCIUM 9.4 03/18/2015 1603   PROT 7.6 03/18/2015 1603   ALBUMIN 4.1 03/18/2015 1603   AST 18 03/18/2015 1603   ALT 15 03/18/2015 1603   ALKPHOS 68 03/18/2015 1603   BILITOT 0.4 03/18/2015 1603   GFRNONAA >89 03/18/2015 1603   GFRAA >89 03/18/2015 1603    Lab Results  Component Value Date/Time   CHOL 168 10/26/2014 02:59 PM    Lab Results  Component Value Date/Time   HGBA1C 7.20 06/21/2015 02:48 PM   HGBA1C 6.7* 10/26/2014 02:59 PM    Lab Results  Component Value Date/Time   AST 18 03/18/2015 04:03 PM    Assessment and Plan  Other specified diabetes mellitus without complications - Plan:  Results for orders placed or performed in visit on 06/21/15  Glucose (CBG)  Result Value Ref Range   POC Glucose 94 70 - 99 mg/dl  HgB A1c  Result Value Ref Range   Hemoglobin A1C 7.20    Hemoglobin A1c has trended up, she is advised for diabetes meal planning, continue with, metFORMIN (GLUCOPHAGE) 500 MG tablet, repeat A1c in 3 months  Essential hypertension - Plan:advised patient for DASH  diet, continue with  amLODipine  (NORVASC) 10 MG tablet  Constipation, unspecified constipation type - Plan: advised patient for high fiber diet, trial of magnesium hydroxide (MILK OF MAGNESIA) 400 MG/5ML suspension  Chronic low back pain - Plan: traMADol (ULTRAM) 50 MG tablet   Return in about 3 months (around 09/21/2015), or if symptoms worsen or fail to improve.   This note has been created with Surveyor, quantity. Any transcriptional errors are unintentional.    Lorayne Marek, MD

## 2015-06-21 NOTE — Progress Notes (Signed)
Patient here for follow up on her diabetes and HTN Patient also requesting medication refills

## 2015-07-22 ENCOUNTER — Other Ambulatory Visit: Payer: Self-pay | Admitting: Internal Medicine

## 2015-07-29 NOTE — Telephone Encounter (Signed)
Pt walked in requesting a refill on traMADol (ULTRAM) 50 MG tablet. Please follow up with pt.

## 2015-08-01 ENCOUNTER — Telehealth: Payer: Self-pay

## 2015-08-01 DIAGNOSIS — M545 Low back pain: Principal | ICD-10-CM

## 2015-08-01 DIAGNOSIS — G8929 Other chronic pain: Secondary | ICD-10-CM

## 2015-08-01 MED ORDER — TRAMADOL HCL 50 MG PO TABS: ORAL_TABLET | ORAL | Status: DC

## 2015-08-01 NOTE — Telephone Encounter (Signed)
Patient is not available at home number Patient was reached on mobile number and is aware she Can pick up her prescription

## 2015-08-24 ENCOUNTER — Other Ambulatory Visit: Payer: Self-pay | Admitting: Internal Medicine

## 2015-09-02 ENCOUNTER — Telehealth: Payer: Self-pay | Admitting: Internal Medicine

## 2015-09-02 ENCOUNTER — Telehealth: Payer: Self-pay

## 2015-09-02 DIAGNOSIS — E139 Other specified diabetes mellitus without complications: Secondary | ICD-10-CM

## 2015-09-02 DIAGNOSIS — I10 Essential (primary) hypertension: Secondary | ICD-10-CM

## 2015-09-02 MED ORDER — AMLODIPINE BESYLATE 10 MG PO TABS: 10.0000 mg | ORAL_TABLET | Freq: Every day | ORAL | Status: DC

## 2015-09-02 MED ORDER — METFORMIN HCL 500 MG PO TABS: 500.0000 mg | ORAL_TABLET | Freq: Every day | ORAL | Status: DC

## 2015-09-02 NOTE — Telephone Encounter (Signed)
Returned patient phone call Patient not available Left message to return our call Prescriptions sent to pharmacy on file

## 2015-09-02 NOTE — Telephone Encounter (Signed)
Patient came in requesting a medication refill for Metformin and Amlodipine. Pt left her medication in a friend house and no able to pick up if the doctor can give her a refill for 1 week patient is feeling week she use [facility] ph # [phone]  Patient also requested medications for her back pain, ibuprofen (ADVIL,MOTRIN) 600 MG  Please follow up.

## 2015-09-22 ENCOUNTER — Encounter: Payer: Self-pay | Admitting: Internal Medicine

## 2015-09-22 ENCOUNTER — Ambulatory Visit: Payer: Self-pay | Attending: Internal Medicine | Admitting: Internal Medicine

## 2015-09-22 VITALS — BP 129/84 | HR 62 | Temp 98.0°F | Resp 16 | Ht 67.0 in | Wt 254.6 lb

## 2015-09-22 DIAGNOSIS — E119 Type 2 diabetes mellitus without complications: Secondary | ICD-10-CM | POA: Insufficient documentation

## 2015-09-22 DIAGNOSIS — I10 Essential (primary) hypertension: Secondary | ICD-10-CM | POA: Insufficient documentation

## 2015-09-22 DIAGNOSIS — Z23 Encounter for immunization: Secondary | ICD-10-CM | POA: Insufficient documentation

## 2015-09-22 DIAGNOSIS — G8929 Other chronic pain: Secondary | ICD-10-CM | POA: Insufficient documentation

## 2015-09-22 DIAGNOSIS — M545 Low back pain: Secondary | ICD-10-CM | POA: Insufficient documentation

## 2015-09-22 DIAGNOSIS — D649 Anemia, unspecified: Secondary | ICD-10-CM | POA: Insufficient documentation

## 2015-09-22 DIAGNOSIS — Z79899 Other long term (current) drug therapy: Secondary | ICD-10-CM | POA: Insufficient documentation

## 2015-09-22 LAB — GLUCOSE, POCT (MANUAL RESULT ENTRY): POC Glucose: 158 mg/dl — AB (ref 70–99)

## 2015-09-22 LAB — POCT GLYCOSYLATED HEMOGLOBIN (HGB A1C): Hemoglobin A1C: 6.8

## 2015-09-22 MED ORDER — TRAMADOL HCL 50 MG PO TABS: ORAL_TABLET | ORAL | Status: DC

## 2015-09-22 MED ORDER — CYCLOBENZAPRINE HCL 10 MG PO TABS: 10.0000 mg | ORAL_TABLET | Freq: Every day | ORAL | Status: DC

## 2015-09-22 MED ORDER — IBUPROFEN 800 MG PO TABS: 800.0000 mg | ORAL_TABLET | Freq: Three times a day (TID) | ORAL | Status: DC | PRN

## 2015-09-22 MED ORDER — FERROUS SULFATE 325 (65 FE) MG PO TABS: 325.0000 mg | ORAL_TABLET | Freq: Every day | ORAL | Status: DC

## 2015-09-22 NOTE — Progress Notes (Signed)
Patient ID: Zipporah Plants, female   DOB: July 06, 1971, 44 y.o.   MRN: [id number] SUBJECTIVE: 44 y.o. female for follow up of diabetes and HTN. Diabetic Review of Systems - medication compliance: compliant all of the time, diabetic diet compliance: compliant all of the time, home glucose monitoring: is performed sporadically.  Other symptoms and concerns: Would like refill on flexeril and Tramadol for chronic back pain.  Current Outpatient Prescriptions  Medication Sig Dispense Refill  . amLODipine (NORVASC) 10 MG tablet Take 1 tablet (10 mg total) by mouth daily. 30 tablet 3  . cyclobenzaprine (FLEXERIL) 10 MG tablet Take 1 tablet (10 mg total) by mouth at bedtime. 30 tablet 3  . ferrous sulfate 325 (65 FE) MG tablet TAKE 1 TABLET BY MOUTH 3 TIMES DAILY 90 tablet 2  . ibuprofen (ADVIL,MOTRIN) 600 MG tablet TAKE 1 TABLET BY MOUTH EVERY 8 HOURS AS NEEDED 30 tablet 2  . ketorolac (TORADOL) 10 MG tablet Take 1 tablet (10 mg total) by mouth every 6 (six) hours as needed. for back pain 20 tablet 0  . loratadine (CLARITIN) 10 MG tablet Take 1 tablet (10 mg total) by mouth daily as needed for allergies. 30 tablet 11  . metFORMIN (GLUCOPHAGE) 500 MG tablet Take 1 tablet (500 mg total) by mouth daily with breakfast. 30 tablet 3  . traMADol (ULTRAM) 50 MG tablet TAKE 1 TABLET BY MOUTH EVERY 8 HOURS AS NEEDED FOR MODERATE PAIN. 30 tablet 0  . Blood Glucose Monitoring Suppl (TRUERESULT BLOOD GLUCOSE) W/DEVICE KIT Check blood sugar TID & QHS 1 each 0  . glucose blood (EQL TRUETEST TEST) test strip Check blood sugar TID & QHS 100 each 12  . magnesium hydroxide (MILK OF MAGNESIA) 400 MG/5ML suspension Take 5 mLs by mouth at bedtime as needed for mild constipation. 360 mL 0  . metroNIDAZOLE (FLAGYL) 500 MG tablet Take 1 tablet (500 mg total) by mouth 2 (two) times daily. (Patient not taking: Reported on 01/11/2015) 14 tablet 0  . TRUEPLUS LANCETS 28G MISC Check blood sugar TID & QHS 100 each 2   No current  facility-administered medications for this visit.   Review of Systems  All other systems reviewed and are negative.   OBJECTIVE: Appearance: alert, well appearing, and in no distress and oriented to person, place, and time. BP 129/84 mmHg  Pulse 62  Temp(Src) 98 F (36.7 C)  Resp 16  Ht _0  (1.702 m)  Wt 254 lb 9.6 oz (115.486 kg)  BMI 39.87 kg/m2  SpO2 100%  Exam: heart sounds normal rate, regular rhythm, normal S1, S2, no murmurs, rubs, clicks or gallops, no JVD, chest clear, no carotid bruits, feet: warm, good capillary refill, no trophic changes or ulcerative lesions and normal DP and PT pulses  ASSESSMENT: Diabetes Mellitus: well controlled  HTN: stable, meds refilled  Chronic low back pain: medications refilled  Anemia: ferrous sulfate refilled, stable with HGB of 14.  Influenza vaccine received today   PLAN: See orders for this visit as documented in the electronic medical record. Issues reviewed with her: diabetic diet discussed in detail, written exchange diet given, low cholesterol diet, weight control and daily exercise discussed, foot care discussed and Podiatry visits discussed, annual eye examinations at Ophthalmology discussed and long term diabetic complications discussed. DASH diet advised   Return in about 3 months (around 12/22/2015) for DM/HTN.  Lance Bosch, NP 09/30/2015 12:18 PM

## 2015-09-22 NOTE — Progress Notes (Signed)
Patient here for follow up on her diabetes and for medication refills 

## 2015-09-29 ENCOUNTER — Telehealth: Payer: Self-pay | Admitting: Internal Medicine

## 2015-09-29 NOTE — Telephone Encounter (Signed)
Patient dropped off paperwork to be filled out by the doctor Please follow up

## 2015-09-30 ENCOUNTER — Telehealth: Payer: Self-pay | Admitting: Internal Medicine

## 2015-09-30 NOTE — Telephone Encounter (Signed)
Patient called requesting a phonecall from the nurse No reason specified. Please follow up.

## 2015-10-03 NOTE — Telephone Encounter (Signed)
Patient called to see if paperwork is ready. I advised that she give the provider a few more days and to call us back.

## 2015-10-06 ENCOUNTER — Telehealth: Payer: Self-pay | Admitting: Internal Medicine

## 2015-10-06 NOTE — Telephone Encounter (Signed)
Patient called asking about the status of a document she dropped off last week. She is waiting to pick up this document so that she can [facility]. Please f/u with patient ASAP.

## 2015-10-13 ENCOUNTER — Encounter: Payer: Self-pay | Admitting: Internal Medicine

## 2015-10-13 ENCOUNTER — Ambulatory Visit: Payer: Self-pay | Attending: Internal Medicine | Admitting: Internal Medicine

## 2015-10-13 VITALS — BP 147/86 | HR 84 | Temp 98.0°F | Resp 16 | Ht 67.0 in | Wt 249.0 lb

## 2015-10-13 DIAGNOSIS — R51 Headache: Secondary | ICD-10-CM | POA: Insufficient documentation

## 2015-10-13 DIAGNOSIS — E119 Type 2 diabetes mellitus without complications: Secondary | ICD-10-CM | POA: Insufficient documentation

## 2015-10-13 DIAGNOSIS — M545 Low back pain: Secondary | ICD-10-CM | POA: Insufficient documentation

## 2015-10-13 DIAGNOSIS — R519 Headache, unspecified: Secondary | ICD-10-CM

## 2015-10-13 DIAGNOSIS — G8929 Other chronic pain: Secondary | ICD-10-CM | POA: Insufficient documentation

## 2015-10-13 LAB — GLUCOSE, POCT (MANUAL RESULT ENTRY): POC GLUCOSE: 251 mg/dL — AB (ref 70–99)

## 2015-10-13 MED ORDER — BUTALBITAL-APAP-CAFFEINE 50-325-40 MG PO TABS: 1.0000 | ORAL_TABLET | Freq: Three times a day (TID) | ORAL | Status: AC | PRN

## 2015-10-13 MED ORDER — KETOROLAC TROMETHAMINE 30 MG/ML IJ SOLN
30.0000 mg | Freq: Once | INTRAMUSCULAR | Status: AC
  Administered 2015-10-13: 30 mg via INTRAMUSCULAR

## 2015-10-13 MED ORDER — TRAMADOL HCL 50 MG PO TABS: ORAL_TABLET | ORAL | Status: DC

## 2015-10-13 MED ORDER — IBUPROFEN 800 MG PO TABS: 800.0000 mg | ORAL_TABLET | Freq: Three times a day (TID) | ORAL | Status: DC | PRN

## 2015-10-13 NOTE — Progress Notes (Signed)
   Subjective:    Patient ID: Karen Tanner, female    DOB: Apr 29, 1971, 44 y.o.   MRN: [id number]  HPI Comments: Headache  Headache  This is a new problem. The current episode started in the past 7 days. The problem occurs intermittently. The pain is located in the temporal and bilateral region. The pain does not radiate. The pain quality is not similar to prior headaches. The quality of the pain is described as throbbing. Associated symptoms include back pain. Pertinent negatives include no anorexia, nausea, phonophobia, photophobia or vomiting. The symptoms are aggravated by menstrual cycle and hunger. She has tried NSAIDs and oral narcotics for the symptoms. The treatment provided mild relief. Her past medical history is significant for obesity.    Review of Systems  Eyes: Negative for photophobia.  Gastrointestinal: Negative for nausea, vomiting and anorexia.  Musculoskeletal: Positive for back pain.  Neurological: Positive for headaches.       Objective:   Physical Exam  Constitutional: She is oriented to person, place, and time.  Cardiovascular: Normal rate, regular rhythm and normal heart sounds.   Pulmonary/Chest: Effort normal and breath sounds normal.  Abdominal: Soft.  Neurological: She is alert and oriented to person, place, and time. No cranial nerve deficit.  Skin: Skin is warm and dry.       Assessment & Plan:  Karen Tanner was seen today for headache.  Diagnoses and all orders for this visit:  Acute nonintractable headache, unspecified headache type -     ketorolac (TORADOL) 30 MG/ML injection 30 mg; Inject 1 mL (30 mg total) into the muscle once. -     butalbital-acetaminophen-caffeine (FIORICET) 50-325-40 MG tablet; Take 1 tablet by mouth every 8 (eight) hours as needed for headache.  Chronic low back pain -     ibuprofen (ADVIL,MOTRIN) 800 MG tablet; Take 1 tablet (800 mg total) by mouth every 8 (eight) hours as needed. -     traMADol (ULTRAM) 50 MG tablet; TAKE  1 TABLET BY MOUTH EVERY 8 HOURS AS NEEDED FOR MODERATE PAIN.  Type 2 diabetes mellitus without complication, without long-term current use of insulin (HCC) -     Glucose (CBG) stable   Return for as directed.    Lance Bosch, NP 10/13/2015 6:09 PM

## 2015-10-13 NOTE — Progress Notes (Signed)
Patient here for follow up Complains of having headaches on and off  Patient also requesting refills on some of her medications

## 2015-11-02 ENCOUNTER — Other Ambulatory Visit: Payer: Self-pay | Admitting: Internal Medicine

## 2015-11-03 ENCOUNTER — Other Ambulatory Visit: Payer: Self-pay

## 2015-11-03 DIAGNOSIS — D649 Anemia, unspecified: Secondary | ICD-10-CM

## 2015-11-03 MED ORDER — FERROUS SULFATE 325 (65 FE) MG PO TABS: 325.0000 mg | ORAL_TABLET | Freq: Every day | ORAL | Status: DC

## 2015-11-09 ENCOUNTER — Ambulatory Visit: Payer: Self-pay

## 2015-11-17 ENCOUNTER — Encounter: Payer: Self-pay | Admitting: Internal Medicine

## 2015-11-17 ENCOUNTER — Ambulatory Visit: Payer: Self-pay | Attending: Internal Medicine | Admitting: Internal Medicine

## 2015-11-17 VITALS — BP 132/81 | HR 76 | Temp 98.0°F | Resp 16 | Wt 251.0 lb

## 2015-11-17 DIAGNOSIS — M545 Low back pain, unspecified: Secondary | ICD-10-CM

## 2015-11-17 NOTE — Progress Notes (Signed)
Patient ID: Karen Tanner, female   DOB: 29-Jul-1971, 44 y.o.   MRN: [id number]  CC: chronic back pain  HPI: Karen Tanner is a 44 y.o. female here today for a follow up visit.  Patient has past medical history of diabetes, HTN, and chronic back pain. Patient reports that she has a orthopedic appointment later this monnth but she has been having pain and wants to know if she can go to the pain management clinic. She does not have insurance currently. No changes in pain character or quality. States that she knows she may need a repeat back surgery. Last surgery was 5 years ago and she had a fusion.   Patient has No headache, No chest pain, No abdominal pain - No Nausea, No new weakness tingling or numbness, No Cough - SOB.  No Known Allergies Past Medical History  Diagnosis Date  . Diabetes mellitus without complication ([place])   . Hypertension    Current Outpatient Prescriptions on File Prior to Visit  Medication Sig Dispense Refill  . amLODipine (NORVASC) 10 MG tablet Take 1 tablet (10 mg total) by mouth daily. 30 tablet 3  . butalbital-acetaminophen-caffeine (FIORICET) 50-325-40 MG tablet Take 1 tablet by mouth every 8 (eight) hours as needed for headache. 20 tablet 0  . cyclobenzaprine (FLEXERIL) 10 MG tablet Take 1 tablet (10 mg total) by mouth at bedtime. 30 tablet 3  . ferrous sulfate 325 (65 FE) MG tablet Take 1 tablet (325 mg total) by mouth daily with breakfast. 30 tablet 1  . ibuprofen (ADVIL,MOTRIN) 800 MG tablet Take 1 tablet (800 mg total) by mouth every 8 (eight) hours as needed. 60 tablet 1  . ketorolac (TORADOL) 10 MG tablet Take 1 tablet (10 mg total) by mouth every 6 (six) hours as needed. for back pain 20 tablet 0  . magnesium hydroxide (MILK OF MAGNESIA) 400 MG/5ML suspension Take 5 mLs by mouth at bedtime as needed for mild constipation. 360 mL 0  . metroNIDAZOLE (FLAGYL) 500 MG tablet Take 1 tablet (500 mg total) by mouth 2 (two) times daily. 14 tablet 0  . Blood  Glucose Monitoring Suppl (TRUERESULT BLOOD GLUCOSE) W/DEVICE KIT Check blood sugar TID & QHS 1 each 0  . glucose blood (EQL TRUETEST TEST) test strip Check blood sugar TID & QHS 100 each 12  . loratadine (CLARITIN) 10 MG tablet Take 1 tablet (10 mg total) by mouth daily as needed for allergies. 30 tablet 11  . metFORMIN (GLUCOPHAGE) 500 MG tablet Take 1 tablet (500 mg total) by mouth daily with breakfast. 30 tablet 3  . traMADol (ULTRAM) 50 MG tablet TAKE 1 TABLET BY MOUTH EVERY 8 HOURS AS NEEDED FOR MODERATE PAIN. 60 tablet 1  . TRUEPLUS LANCETS 28G MISC Check blood sugar TID & QHS 100 each 2   No current facility-administered medications on file prior to visit.   Family History  Problem Relation Age of Onset  . Diabetes Mother   . Hyperlipidemia Mother   . Heart disease Mother   . Diabetes Maternal Grandmother   . Diabetes Father    Social History   Social History  . Marital Status: Single    Spouse Name: N/A  . Number of Children: N/A  . Years of Education: N/A   Occupational History  . Not on file.   Social History Main Topics  . Smoking status: Former Smoker -- 0.25 packs/day for 14 years  . Smokeless tobacco: Not on file     Comment: Smoking  6-7 cigs per day  . Alcohol Use: 0.0 oz/week    0 Standard drinks or equivalent per week  . Drug Use: Not on file  . Sexual Activity: Not on file   Other Topics Concern  . Not on file   Social History Narrative    Review of Systems: Other than what is stated in HPI, all other systems are negative.   Objective:   Filed Vitals:   11/17/15 1434  BP: 132/81  Pulse: 76  Temp: 98 F (36.7 C)  Resp: 16    Physical Exam  Cardiovascular: Normal rate, regular rhythm and normal heart sounds.   Pulmonary/Chest: Effort normal and breath sounds normal.  Musculoskeletal: Normal range of motion. She exhibits no tenderness.     Lab Results  Component Value Date   WBC 6.6 03/18/2015   HGB 14.6 03/18/2015   HCT 43.1  03/18/2015   MCV 81.9 03/18/2015   PLT 253 03/18/2015   Lab Results  Component Value Date   CREATININE 0.70 03/18/2015   BUN 10 03/18/2015   NA 137 03/18/2015   K 4.5 03/18/2015   CL 101 03/18/2015   CO2 26 03/18/2015    Lab Results  Component Value Date   HGBA1C 6.80 09/22/2015   Lipid Panel     Component Value Date/Time   CHOL 168 10/26/2014 1459   TRIG 96 10/26/2014 1459   HDL 52 10/26/2014 1459   CHOLHDL 3.2 10/26/2014 1459   VLDL 19 10/26/2014 1459   LDLCALC 97 10/26/2014 1459       Assessment and plan:   Karen Tanner was seen today for back pain.  Diagnoses and all orders for this visit:  Midline low back pain without sciatica I have explained to patient that she will need health insurance to go to pain management facility. She may follow up with Orthopedics to see if there are any options to help with pain and mobility until she obtains insurance to afford surgery. Patient verbalizes understanding     Return if symptoms worsen or fail to improve.   Lance Bosch, [facility] [phone] 11/17/2015, 3:00 PM

## 2015-11-17 NOTE — Progress Notes (Signed)
Patient here for her chronic back pain Patient is requesting a referral to pain managment

## 2015-11-23 ENCOUNTER — Ambulatory Visit: Payer: Self-pay | Attending: Internal Medicine

## 2016-01-11 MED FILL — traMADol HCL 50 MG TABS: 50 | 8 days supply | Qty: 60 | Fill #2

## 2016-01-11 MED FILL — FERROUS SULFATE 325 MG TAB: 325 (65 FE) | 30 days supply | Qty: 30 | Fill #1

## 2016-01-17 MED FILL — AMLODIPINE BESYLATE 10 MG T: 10 | 30 days supply | Qty: 30 | Fill #2

## 2016-01-17 MED FILL — IBUPROFEN 800 MG TABLET: 800 | 30 days supply | Qty: 90 | Fill #1

## 2016-01-17 MED FILL — ?METFORMIN HCL 500MG TABLET: 500 | 30 days supply | Qty: 30 | Fill #2

## 2016-01-18 ENCOUNTER — Ambulatory Visit: Payer: Self-pay | Attending: Internal Medicine | Admitting: Internal Medicine

## 2016-01-18 ENCOUNTER — Encounter: Payer: Self-pay | Admitting: Internal Medicine

## 2016-01-18 VITALS — BP 126/82 | HR 71 | Temp 98.0°F | Resp 17 | Ht 67.0 in | Wt 253.0 lb

## 2016-01-18 DIAGNOSIS — Z23 Encounter for immunization: Secondary | ICD-10-CM

## 2016-01-18 DIAGNOSIS — Z79899 Other long term (current) drug therapy: Secondary | ICD-10-CM | POA: Insufficient documentation

## 2016-01-18 DIAGNOSIS — I10 Essential (primary) hypertension: Secondary | ICD-10-CM | POA: Insufficient documentation

## 2016-01-18 DIAGNOSIS — G8929 Other chronic pain: Secondary | ICD-10-CM

## 2016-01-18 DIAGNOSIS — M545 Low back pain: Secondary | ICD-10-CM

## 2016-01-18 DIAGNOSIS — Z7984 Long term (current) use of oral hypoglycemic drugs: Secondary | ICD-10-CM | POA: Insufficient documentation

## 2016-01-18 DIAGNOSIS — Z6839 Body mass index (BMI) 39.0-39.9, adult: Secondary | ICD-10-CM | POA: Insufficient documentation

## 2016-01-18 DIAGNOSIS — E119 Type 2 diabetes mellitus without complications: Secondary | ICD-10-CM | POA: Insufficient documentation

## 2016-01-18 LAB — GLUCOSE, POCT (MANUAL RESULT ENTRY): POC Glucose: 95 mg/dl (ref 70–99)

## 2016-01-18 LAB — POCT GLYCOSYLATED HEMOGLOBIN (HGB A1C): Hemoglobin A1C: 6.4

## 2016-01-18 MED ORDER — METFORMIN HCL 500 MG PO TABS: 500.0000 mg | ORAL_TABLET | Freq: Every day | ORAL | Status: DC

## 2016-01-18 MED ORDER — TRAMADOL HCL 50 MG PO TABS: ORAL_TABLET | ORAL | Status: DC

## 2016-01-18 MED ORDER — AMLODIPINE BESYLATE 10 MG PO TABS: 10.0000 mg | ORAL_TABLET | Freq: Every day | ORAL | Status: DC

## 2016-01-18 MED ORDER — GLUCOSE BLOOD VI STRP: ORAL_STRIP | Status: DC

## 2016-01-18 MED ORDER — TRUE METRIX METER W/DEVICE KIT: PACK | Status: DC

## 2016-01-18 MED ORDER — IBUPROFEN 800 MG PO TABS: 800.0000 mg | ORAL_TABLET | Freq: Three times a day (TID) | ORAL | Status: DC | PRN

## 2016-01-18 MED ORDER — TRUEPLUS LANCETS 28G MISC: Status: DC

## 2016-01-18 MED FILL — TRUEplus LANCETS 28G MISC: 33 days supply | Qty: 100 | Fill #0

## 2016-01-18 MED FILL — traMADol HCL 50 MG TABS: 50 | 20 days supply | Qty: 60 | Fill #0

## 2016-01-18 MED FILL — TRUE METRIX TEST STRIP: 17 days supply | Qty: 50 | Fill #0

## 2016-01-18 MED FILL — !TRUE METRIX BLOOD GLUCOSE: 33 days supply | Qty: 1 | Fill #0

## 2016-01-18 NOTE — Progress Notes (Signed)
Patient ID: Karen Tanner, female   DOB: Jan 02, 1971, 45 y.o.   MRN: [id number] SUBJECTIVE: 45 y.o. female for follow up of diabetes and hypertension. Diabetic Review of Systems - medication compliance: compliant all of the time, diabetic diet compliance: compliant all of the time, home glucose monitoring: is performed sporadically, further diabetic ROS: no polyuria or polydipsia, no chest pain, dyspnea or TIA's, no numbness, tingling or pain in extremities, no unusual visual symptoms, no hypoglycemia.  She is requesting refill son medications. Current Outpatient Prescriptions  Medication Sig Dispense Refill  . amLODipine (NORVASC) 10 MG tablet Take 1 tablet (10 mg total) by mouth daily. 30 tablet 3  . butalbital-acetaminophen-caffeine (FIORICET) 50-325-40 MG tablet Take 1 tablet by mouth every 8 (eight) hours as needed for headache. 20 tablet 0  . cyclobenzaprine (FLEXERIL) 10 MG tablet Take 1 tablet (10 mg total) by mouth at bedtime. 30 tablet 3  . ferrous sulfate 325 (65 FE) MG tablet Take 1 tablet (325 mg total) by mouth daily with breakfast. 30 tablet 1  . GABAPENTIN PO Take by mouth.    Marland Kitchen ibuprofen (ADVIL,MOTRIN) 800 MG tablet Take 1 tablet (800 mg total) by mouth every 8 (eight) hours as needed. 60 tablet 1  . metFORMIN (GLUCOPHAGE) 500 MG tablet Take 1 tablet (500 mg total) by mouth daily with breakfast. 30 tablet 3  . loratadine (CLARITIN) 10 MG tablet Take 1 tablet (10 mg total) by mouth daily as needed for allergies. 30 tablet 11   No current facility-administered medications for this visit.  Review of Systems: Other than what is stated in HPI, all other systems are negative.    OBJECTIVE: Appearance: alert, well appearing, and in no distress, oriented to person, place, and time and overweight. BP 126/82 mmHg  Pulse 71  Temp(Src) 98 F (36.7 C)  Resp 17  Ht 5\' 7"  (1.702 m)  Wt 253 lb (114.76 kg)  BMI 39.62 kg/m2  SpO2 100%  Exam: heart sounds normal rate, regular rhythm,  normal S1, S2, no murmurs, rubs, clicks or gallops, no JVD, chest clear, no carotid bruits, feet: warm, good capillary refill, no trophic changes or ulcerative lesions, normal DP and PT pulses, normal monofilament exam and normal sensory exam  ASSESSMENT: Reka was seen today for follow-up.  Diagnoses and all orders for this visit:  Type 2 diabetes mellitus without complication, without long-term current use of insulin (HCC) -     Glucose (CBG) -     HgB A1c -     metFORMIN (GLUCOPHAGE) 500 MG tablet; Take 1 tablet (500 mg total) by mouth daily with breakfast. -     TRUEPLUS LANCETS 28G MISC; Check sugar 3 times per day for E11.9 -     Blood Glucose Monitoring Suppl (TRUE METRIX METER) w/Device KIT; Check sugar 3 times per day for E11.9 -     glucose blood (TRUE METRIX BLOOD GLUCOSE TEST) test strip; Check sugar 3 times per day for E11.9 -     Microalbumin, urine -     Basic Metabolic Panel; Future -     Lipid panel; Future Patients diabetes is well control as evidence by consistently low a1c.  Patient will continue with current therapy and continue to make necessary lifestyle changes.  Reviewed foot care, diet, exercise, annual health maintenance with patient.   Essential hypertension -     amLODipine (NORVASC) 10 MG tablet; Take 1 tablet (10 mg total) by mouth daily. Patient blood pressure is stable and may continue  on current medication.  Education on diet, exercise, and modifiable risk factors discussed. Will obtain appropriate labs as needed. Will follow up in 3-6 months.   Chronic low back pain -     ibuprofen (ADVIL,MOTRIN) 800 MG tablet; Take 1 tablet (800 mg total) by mouth every 8 (eight) hours as needed. -     traMADol (ULTRAM) 50 MG tablet; TAKE 1 TABLET BY MOUTH EVERY 8 HOURS AS NEEDED FOR MODERATE PAIN. Stable, Meds refilled  Need for Tdap vaccination -     Tdap vaccine greater than or equal to 7yo IM   PLAN: See orders for this visit as documented in the electronic  medical record. Issues reviewed with her: diabetic diet discussed in detail, written exchange diet given, low cholesterol diet, weight control and daily exercise discussed, foot care discussed and Podiatry visits discussed and annual eye examinations at Ophthalmology discussed.  Return in about 3 months (around 04/17/2016) for DM/HTN and this week lab visit.  Lance Bosch, NP 01/24/2016 9:16 PM

## 2016-01-18 NOTE — Progress Notes (Signed)
Patient here for follow up on her diabetes and  Hypertension Patient also in need of medication refills and a new glucometer

## 2016-01-19 LAB — MICROALBUMIN, URINE: MICROALB UR: 0.3 mg/dL

## 2016-01-23 ENCOUNTER — Ambulatory Visit: Payer: Self-pay | Attending: Internal Medicine

## 2016-01-23 DIAGNOSIS — E119 Type 2 diabetes mellitus without complications: Secondary | ICD-10-CM

## 2016-01-23 LAB — LIPID PANEL
CHOLESTEROL: 165 mg/dL (ref 125–200)
HDL: 52 mg/dL (ref >=46)
LDL Cholesterol: 104 mg/dL (ref <130)
Total CHOL/HDL Ratio: 3.2 Ratio (ref <=5.0)
Triglycerides: 47 mg/dL (ref <150)
VLDL: 9 mg/dL (ref <30)

## 2016-01-23 LAB — BASIC METABOLIC PANEL
BUN: 8 mg/dL (ref 7–25)
CALCIUM: 8.8 mg/dL (ref 8.6–10.2)
CO2: 23 mmol/L (ref 20–31)
CREATININE: 0.66 mg/dL (ref 0.50–1.10)
Chloride: 103 mmol/L (ref 98–110)
Glucose, Bld: 108 mg/dL — ABNORMAL HIGH (ref 65–99)
Potassium: 4.2 mmol/L (ref 3.5–5.3)
SODIUM: 139 mmol/L (ref 135–146)

## 2016-01-31 ENCOUNTER — Telehealth: Payer: Self-pay

## 2016-01-31 MED ORDER — ATORVASTATIN CALCIUM 10 MG PO TABS: 10.0000 mg | ORAL_TABLET | Freq: Every day | ORAL | Status: DC

## 2016-01-31 MED FILL — ATORVASTATIN 10 MG TABLET: 10 | 30 days supply | Qty: 30 | Fill #0

## 2016-01-31 NOTE — Telephone Encounter (Signed)
-----   Message from Lance Bosch, NP sent at 01/31/2016  8:23 AM EST ----- Bad cholesterol is slightly elevated. I want to place her on a low dose statin therapy since she is a diabetic to lower risk of stroke and heart disease. Please send atorvastatin 10 mg to take daily with dinner. Go over specific diet changes

## 2016-01-31 NOTE — Telephone Encounter (Signed)
Spoke with patient this am and she is aware she needs To work on getting her cholesterol down RX for atorvastatin sent to [facility]

## 2016-02-08 MED FILL — HYDROCODON-APAP 5-325: 5-325 | 16 days supply | Qty: 93 | Fill #0

## 2016-02-16 MED FILL — FERROUS SULFATE 325 MG TAB: 325 (65 FE) | 30 days supply | Qty: 90 | Fill #1

## 2016-02-16 MED FILL — traMADol HCL 50 MG TABS: 50 | 20 days supply | Qty: 60 | Fill #1

## 2016-02-16 MED FILL — AMLODIPINE BESYLATE 10 MG T: 10 | 30 days supply | Qty: 30 | Fill #3

## 2016-02-16 MED FILL — metFORMIN HCL 500 MG TABS: 500 | 30 days supply | Qty: 30 | Fill #3

## 2016-02-22 MED FILL — HYDROCODON-APAP 5-325: 5-325 | 16 days supply | Qty: 93 | Fill #0

## 2016-03-03 ENCOUNTER — Emergency Department (HOSPITAL_COMMUNITY)
Admission: EM | Admit: 2016-03-03 | Discharge: 2016-03-03 | Disposition: A | Discharge status: Home / Self Care | Payer: Self-pay | Attending: Emergency Medicine | Admitting: Emergency Medicine

## 2016-03-03 ENCOUNTER — Encounter (HOSPITAL_COMMUNITY): Payer: Self-pay | Admitting: Emergency Medicine

## 2016-03-03 DIAGNOSIS — R0982 Postnasal drip: Secondary | ICD-10-CM | POA: Insufficient documentation

## 2016-03-03 DIAGNOSIS — G8929 Other chronic pain: Secondary | ICD-10-CM | POA: Insufficient documentation

## 2016-03-03 DIAGNOSIS — Z7984 Long term (current) use of oral hypoglycemic drugs: Secondary | ICD-10-CM | POA: Insufficient documentation

## 2016-03-03 DIAGNOSIS — R51 Headache: Secondary | ICD-10-CM | POA: Insufficient documentation

## 2016-03-03 DIAGNOSIS — E119 Type 2 diabetes mellitus without complications: Secondary | ICD-10-CM | POA: Insufficient documentation

## 2016-03-03 DIAGNOSIS — K047 Periapical abscess without sinus: Secondary | ICD-10-CM | POA: Insufficient documentation

## 2016-03-03 DIAGNOSIS — Z79899 Other long term (current) drug therapy: Secondary | ICD-10-CM | POA: Insufficient documentation

## 2016-03-03 DIAGNOSIS — M549 Dorsalgia, unspecified: Secondary | ICD-10-CM | POA: Insufficient documentation

## 2016-03-03 DIAGNOSIS — D649 Anemia, unspecified: Secondary | ICD-10-CM | POA: Insufficient documentation

## 2016-03-03 DIAGNOSIS — Z87891 Personal history of nicotine dependence: Secondary | ICD-10-CM | POA: Insufficient documentation

## 2016-03-03 DIAGNOSIS — I10 Essential (primary) hypertension: Secondary | ICD-10-CM | POA: Insufficient documentation

## 2016-03-03 DIAGNOSIS — J3489 Other specified disorders of nose and nasal sinuses: Secondary | ICD-10-CM | POA: Insufficient documentation

## 2016-03-03 HISTORY — DX: Anemia, unspecified: D64.9

## 2016-03-03 MED ORDER — PENICILLIN V POTASSIUM 500 MG PO TABS: 500.0000 mg | ORAL_TABLET | Freq: Four times a day (QID) | ORAL | Status: DC

## 2016-03-03 NOTE — Discharge Instructions (Signed)
Karen Tanner, it was a pleasure taking care of you. For your dental infection, please take Penicillin 500 mg four times a day for 7 days. This should treat the infection and reduce the pain. If this continues to worsen, please seek out a dentist. Apply warm compresses to the area 30 minutes at a time four times a day.   Dental Care and Dentist Visits Dental care supports good overall health. Regular dental visits can also help you avoid dental pain, bleeding, infection, and other more serious health problems in the future. It is important to keep the mouth healthy because diseases in the teeth, gums, and other oral tissues can spread to other areas of the body. Some problems, such as diabetes, heart disease, and pre-term labor have been associated with poor oral health.  See your dentist every 6 months. If you experience emergency problems such as a toothache or broken tooth, go to the dentist right away. If you see your dentist regularly, you may catch problems early. It is easier to be treated for problems in the early stages.  WHAT TO EXPECT AT A DENTIST VISIT  Your dentist will look for many common oral health problems and recommend proper treatment. At your regular dental visit, you can expect:  Gentle cleaning of the teeth and gums. This includes scraping and polishing. This helps to remove the sticky substance around the teeth and gums (plaque). Plaque forms in the mouth shortly after eating. Over time, plaque hardens on the teeth as tartar. If tartar is not removed regularly, it can cause problems. Cleaning also helps remove stains.  Periodic X-rays. These pictures of the teeth and supporting bone will help your dentist assess the health of your teeth.  Periodic fluoride treatments. Fluoride is a natural mineral shown to help strengthen teeth. Fluoride treatmentinvolves applying a fluoride gel or varnish to the teeth. It is most commonly done in children.  Examination of the mouth, tongue,  jaws, teeth, and gums to look for any oral health problems, such as:  Cavities (dental caries). This is decay on the tooth caused by plaque, sugar, and acid in the mouth. It is best to catch a cavity when it is small.  Inflammation of the gums caused by plaque buildup (gingivitis).  Problems with the mouth or malformed or misaligned teeth.  Oral cancer or other diseases of the soft tissues or jaws. KEEP YOUR TEETH AND GUMS HEALTHY For healthy teeth and gums, follow these general guidelines as well as your dentist's specific advice:  Have your teeth professionally cleaned at the dentist every 6 months.  Brush twice daily with a fluoride toothpaste.  Floss your teeth daily.  Ask your dentist if you need fluoride supplements, treatments, or fluoride toothpaste.  Eat a healthy diet. Reduce foods and drinks with added sugar.  Avoid smoking. TREATMENT FOR ORAL HEALTH PROBLEMS If you have oral health problems, treatment varies depending on the conditions present in your teeth and gums.  Your caregiver will most likely recommend good oral hygiene at each visit.  For cavities, gingivitis, or other oral health disease, your caregiver will perform a procedure to treat the problem. This is typically done at a separate appointment. Sometimes your caregiver will refer you to another dental specialist for specific tooth problems or for surgery. SEEK IMMEDIATE DENTAL CARE IF:  You have pain, bleeding, or soreness in the gum, tooth, jaw, or mouth area.  A permanent tooth becomes loose or separated from the gum socket.  You experience a  blow or injury to the mouth or jaw area.   This information is not intended to replace advice given to you by your health care provider. Make sure you discuss any questions you have with your health care provider.   Document Released: 08/29/2011 Document Revised: 03/10/2012 Document Reviewed: 08/29/2011 Elsevier Interactive Patient Education [employer].

## 2016-03-03 NOTE — ED Provider Notes (Signed)
CSN: [id number]     Arrival date & time 03/03/16  1115 History   First MD Initiated Contact with Patient 03/03/16 1143     Chief Complaint  Patient presents with  . Facial Swelling     (Consider location/radiation/quality/duration/timing/severity/associated sxs/prior Treatment) HPI   Karen Tanner is a 45 year old woman with PMH of chronic lower back pain s/p PLIF, T2DM, HTN, who presents with five days of progressively worsening facial swelling. This started shortly after the onset of URI symptoms Sunday night, which was associated with sinus pain, congestion, and a runny nose. Over time, her left face started to swell, which she may be associated with tooth pain emanating from her upper left incisor. She does not follow with a dentist. Because of her increased pain requirements, she reported having to take her "oxycodone 5 mg" TID prn as QID prn, and therefore, has run out of this medication. She said she is not due for another refill for two weeks. This is provided by [facility] Basil Dess at [facility], who is not in our [place]. She is also prescribed Tramadol by Chari Manning at [facility]..  The only new medications she has taken are Mucinex and Alkaseltzer Cold Plus to treat her cold. She denies any sick contacts. She smokes cigarillos occasionally, drinks occasionally, and is living with her Barbaraann Rondo as she awaits a disability hearing.   Past Medical History  Diagnosis Date  . Diabetes mellitus without complication ([place])   . Hypertension   . Anemia    Past Surgical History  Procedure Laterality Date  . Back surgery      Bone fusion   Family History  Problem Relation Age of Onset  . Diabetes Mother   . Hyperlipidemia Mother   . Heart disease Mother   . Diabetes Maternal Grandmother   . Diabetes Father    Social History  Substance Use Topics  . Smoking status: Former Smoker -- 0.25 packs/day for 14 years  . Smokeless tobacco: None     Comment:  Smoking 6-7 cigs per day  . Alcohol Use: 0.0 oz/week    0 Standard drinks or equivalent per week   OB History    No data available     Review of Systems  Constitutional: Negative for fever and chills.  HENT: Positive for congestion, dental problem, facial swelling, postnasal drip, rhinorrhea and sinus pressure. Negative for sore throat and trouble swallowing.   Respiratory: Negative for cough, choking, chest tightness, shortness of breath and stridor.   Cardiovascular: Negative for chest pain and leg swelling.  Gastrointestinal: Negative for nausea and abdominal pain.  Genitourinary: Negative for dysuria.  Musculoskeletal: Positive for back pain and arthralgias.  Skin: Negative for rash.  Neurological: Positive for headaches. Negative for light-headedness and numbness.  Psychiatric/Behavioral: Negative for dysphoric mood. The patient is not nervous/anxious.   All other systems reviewed and are negative.     Allergies  Review of patient's allergies indicates no known allergies.  Home Medications   Prior to Admission medications   Medication Sig Start Date End Date Taking? Authorizing Provider  amLODipine (NORVASC) 10 MG tablet Take 1 tablet (10 mg total) by mouth daily. 01/18/16   Lance Bosch, NP  atorvastatin (LIPITOR) 10 MG tablet Take 1 tablet (10 mg total) by mouth daily. 01/31/16   Lance Bosch, NP  Blood Glucose Monitoring Suppl (TRUE METRIX METER) w/Device KIT Check sugar 3 times per day for E11.9 01/18/16   Mateo Flow  [place], NP  butalbital-acetaminophen-caffeine (FIORICET) 50-325-40 MG tablet Take 1 tablet by mouth every 8 (eight) hours as needed for headache. 10/13/15 10/12/16  Lance Bosch, NP  cyclobenzaprine (FLEXERIL) 10 MG tablet Take 1 tablet (10 mg total) by mouth at bedtime. 09/22/15   Lance Bosch, NP  ferrous sulfate 325 (65 FE) MG tablet Take 1 tablet (325 mg total) by mouth daily with breakfast. 11/03/15   Lance Bosch, NP  GABAPENTIN PO Take by mouth.     Historical Provider, MD  glucose blood (TRUE METRIX BLOOD GLUCOSE TEST) test strip Check sugar 3 times per day for E11.9 01/18/16   Lance Bosch, NP  ibuprofen (ADVIL,MOTRIN) 800 MG tablet Take 1 tablet (800 mg total) by mouth every 8 (eight) hours as needed. 01/18/16   Lance Bosch, NP  loratadine (CLARITIN) 10 MG tablet Take 1 tablet (10 mg total) by mouth daily as needed for allergies. 04/11/15   Lorayne Marek, MD  metFORMIN (GLUCOPHAGE) 500 MG tablet Take 1 tablet (500 mg total) by mouth daily with breakfast. 01/18/16   Lance Bosch, NP  penicillin v potassium (VEETID) 500 MG tablet Take 1 tablet (500 mg total) by mouth 4 (four) times daily. 03/03/16   Liberty Handy, MD  traMADol (ULTRAM) 50 MG tablet TAKE 1 TABLET BY MOUTH EVERY 8 HOURS AS NEEDED FOR MODERATE PAIN. 01/18/16   Lance Bosch, NP  TRUEPLUS LANCETS 28G MISC Check sugar 3 times per day for E11.9 01/18/16   Lance Bosch, NP   BP 125/90 mmHg  Pulse 65  Temp(Src) 98.8 F (37.1 C)  Resp 18  SpO2 98%  LMP 02/27/2016 (Approximate) Physical Exam  Constitutional: She appears well-developed and well-nourished. No distress.  HENT:  Head: Normocephalic and atraumatic.  Mouth/Throat: No oropharyngeal exudate.  Poor dentition. Local swelling and tenderness at upper left incisor. Mild facial swelling on the left without loss of sensation. No trismus.  Eyes: EOM are normal. Pupils are equal, round, and reactive to light. No scleral icterus.  Neck: Normal range of motion. Neck supple.  Cardiovascular: Normal rate, regular rhythm and normal heart sounds.   Pulmonary/Chest: Effort normal and breath sounds normal. No stridor. No respiratory distress. She has no wheezes.  Abdominal: Soft. Bowel sounds are normal. She exhibits no distension. There is no tenderness.  Musculoskeletal: She exhibits no edema.  Lymphadenopathy:    She has no cervical adenopathy.  Neurological: She is alert. She has normal reflexes.  Skin: Skin is warm and dry.  She is not diaphoretic.  Psychiatric: She has a normal mood and affect. Her behavior is normal.  Nursing note and vitals reviewed.   ED Course  Procedures (including critical care time) Labs Review Labs Reviewed - No data to display  Imaging Review No results found. I have personally reviewed and evaluated these images and lab results as part of my medical decision-making.   EKG Interpretation None      MDM   Final diagnoses:  Dental infection   In setting of poor dentition and local inflammation around the incisor, dental infection (e.g., pulpitis), is most likely cause. I will prescribe Penicillin V Potassium 500 mg q6h for a 7 days course. She indicated that she would be unable to afford this $15 prescription, so Care Management was consulted. I recommended that she administer warm compresses to the face and to seek out a dentist as soon as possible.   Reviewing the Controlled Substances Database, she has received ~150  pills of opioids (between hydrocodone 5-325 & tramadol) from two different providers over the last month. I informed her that we would not be providing her any opioid pain medications, to which she was agreeable.   She was discharged from the ED in good condition with instructions.    Liberty Handy, MD 03/03/16 [place], MD 03/04/16 [phone]

## 2016-03-03 NOTE — ED Notes (Signed)
Pt states she has facial swelling x2 days. Denies any new medication. Left side of face is mildly swollen.

## 2016-03-03 NOTE — Care Management Note (Signed)
Case Management Note  Patient Details  Name: Karen Tanner MRN: [id number] Date of Birth: June 01, 1971  Subjective/Objective:   45 y.o. F seen in the ED for  Dental pain. Provider has prescribed antibiotic which pt reports she is unable to afford. No Insurance or payer source.                 Action/Plan:  [place]  ED CM consulted by EDP, for medication assistance. CM reviewed EPIC notes and chart review information CM spoke with the pt about [facility] MATCH program ($3 co pay for each Rx through [facility] program, does not include refills, 7 day expiration of [place] letter and choice of pharmacies) Pt agreed to receive assistance from program. Pt is eligible for [facility] MATCH program (unable to find pt listed in PDMI per cardholder name inquiry) PDMI information entered. [place] letter completed and provided to pt. CM updated EDP, pt  and ED RN       Expected Discharge Date:                  Expected Discharge Plan:  Home/Self Care  In-House Referral:     Discharge planning Services  CM Consult, Medication Assistance, [place] Program  Post Acute Care Choice:    Choice offered to:  Patient  DME Arranged:    DME Agency:     HH Arranged:    [place] Agency:     Status of Service:  Completed, signed off  Medicare Important Message Given:    Date Medicare IM Given:    Medicare IM give by:    Date Additional Medicare IM Given:    Additional Medicare Important Message give by:     If discussed at [place] of Stay Meetings, dates discussed:    Additional Comments:  Delrae Sawyers, RN 03/03/2016, 1:58 PM

## 2016-03-03 NOTE — ED Notes (Signed)
MD at bedside. 

## 2016-03-03 NOTE — ED Provider Notes (Signed)
I saw and evaluated the patient, reviewed the resident's note and I agree with the findings and plan.   EKG Interpretation None      Patient presents for evaluation of facial pain and swelling. She's been taking extra oxycodone, but has run out. She received a prescription for oxycodone 5/325, #93 on 02/22/2016. She had a similar prescription, on 02/08/16. He also received tramadol 50 mg #60 on 02/16/2016, from a different provider.  Exam- no significant facial swelling. No trismus. Tender left upper lateral incisor empty space, with localized swelling, and questionable retained old fragment. Airway intact.    MDM- dental infection, as source of pain and swelling. Patient is receiving numerous overlapping narcotic prescriptions from 2 different providers. This indicates drug diversion behavior.  "Diversion" means the transfer of a controlled substance from a lawful to an unlawful channel of distribution or use.  Uniform Controlled Substances Act (1994) ( Drug Abuse Prevention and Control 21 USC Chapter 13 (2011) http://uscode.PrankCrew.uy.txt Accessed March 19, 2011.)  "Diversion" means "Any criminal act involving a prescription drug."  National Association of Drug Diversion InvestigatorsBig Lots of Drug Diversion Investigators (NADDI) Web site http://naddi.associationdatabase.com/aws/NADDI/pt/sp/Home_Page Accessed March 19, 2011.)  Daleen Bo, MD 03/04/16 [phone]

## 2016-03-21 MED FILL — ?CYCLOBENZAPRINE 10 MG TABL: 10 | 30 days supply | Qty: 30 | Fill #1

## 2016-03-21 MED FILL — AMLODIPINE BESYLATE 10 MG T: 10 | 30 days supply | Qty: 30 | Fill #0

## 2016-03-21 MED FILL — metFORMIN HCL 500 MG TABS: 500 | 30 days supply | Qty: 30 | Fill #0

## 2016-03-21 MED FILL — ATORVASTATIN 10 MG TABLET: 10 | 30 days supply | Qty: 30 | Fill #1

## 2016-03-26 MED FILL — HYDROCODON-APAP 5-325: 5-325 | 20 days supply | Qty: 60 | Fill #0

## 2016-05-01 MED FILL — AMLODIPINE BESYLATE 10 MG T: 10 | 30 days supply | Qty: 30 | Fill #1

## 2016-05-01 MED FILL — metFORMIN HCL 500 MG TABS: 500 | 30 days supply | Qty: 30 | Fill #1

## 2016-05-01 MED FILL — ATORVASTATIN 10 MG TABLET: 10 | 30 days supply | Qty: 30 | Fill #2

## 2016-05-01 MED FILL — IBUPROFEN 800 MG TABLET: 800 | 30 days supply | Qty: 90 | Fill #2

## 2016-05-09 ENCOUNTER — Ambulatory Visit: Payer: Self-pay | Attending: Internal Medicine

## 2016-05-09 ENCOUNTER — Other Ambulatory Visit: Payer: Self-pay | Admitting: Internal Medicine

## 2016-05-17 MED FILL — FERROUS SULFATE 325 MG TAB: 325 (65 FE) | 30 days supply | Qty: 90 | Fill #2

## 2016-05-17 MED FILL — MUPIROCIN 2% OINTMENT: 2 | 10 days supply | Qty: 22 | Fill #0

## 2016-05-17 MED FILL — traMADol HCL 50 MG TABS: 50 | 30 days supply | Qty: 60 | Fill #0

## 2016-05-17 MED FILL — CEPHALEXIN 500 MG CAPSULE: 500 | 5 days supply | Qty: 10 | Fill #0

## 2016-05-21 ENCOUNTER — Encounter: Payer: Self-pay | Admitting: Family Medicine

## 2016-05-21 ENCOUNTER — Ambulatory Visit: Payer: Self-pay | Admitting: Family Medicine

## 2016-05-21 DIAGNOSIS — E1169 Type 2 diabetes mellitus with other specified complication: Secondary | ICD-10-CM | POA: Insufficient documentation

## 2016-05-21 DIAGNOSIS — E119 Type 2 diabetes mellitus without complications: Secondary | ICD-10-CM | POA: Insufficient documentation

## 2016-06-10 DIAGNOSIS — R1010 Upper abdominal pain, unspecified: Secondary | ICD-10-CM | POA: Diagnosis not present

## 2016-06-10 DIAGNOSIS — R1013 Epigastric pain: Secondary | ICD-10-CM | POA: Diagnosis not present

## 2016-06-10 DIAGNOSIS — K921 Melena: Secondary | ICD-10-CM | POA: Diagnosis not present

## 2016-06-10 DIAGNOSIS — R7989 Other specified abnormal findings of blood chemistry: Secondary | ICD-10-CM | POA: Diagnosis not present

## 2016-06-10 DIAGNOSIS — E119 Type 2 diabetes mellitus without complications: Secondary | ICD-10-CM | POA: Diagnosis not present

## 2016-06-10 DIAGNOSIS — R1011 Right upper quadrant pain: Secondary | ICD-10-CM | POA: Diagnosis not present

## 2016-06-10 DIAGNOSIS — I10 Essential (primary) hypertension: Secondary | ICD-10-CM | POA: Diagnosis not present

## 2016-06-12 MED FILL — AMLODIPINE BESYLATE 10 MG T: 10 | 30 days supply | Qty: 30 | Fill #2

## 2016-06-12 MED FILL — traMADol HCL 50 MG TABS: 50 | 15 days supply | Qty: 30 | Fill #1

## 2016-06-12 MED FILL — metFORMIN HCL 500 MG TABS: 500 | 30 days supply | Qty: 30 | Fill #2

## 2016-06-12 MED FILL — ?CYCLOBENZAPRINE 10 MG TABL: 10 | 30 days supply | Qty: 30 | Fill #2

## 2016-06-12 MED FILL — ATORVASTATIN 10 MG TABLET: 10 | 30 days supply | Qty: 30 | Fill #3

## 2016-06-13 ENCOUNTER — Ambulatory Visit: Payer: Medicare Other | Attending: Physician Assistant | Admitting: Physician Assistant

## 2016-06-13 ENCOUNTER — Encounter: Payer: Self-pay | Admitting: Physician Assistant

## 2016-06-13 VITALS — BP 133/77 | HR 73 | Temp 98.6°F | Resp 18 | Ht 67.5 in | Wt 256.6 lb

## 2016-06-13 DIAGNOSIS — I10 Essential (primary) hypertension: Secondary | ICD-10-CM | POA: Diagnosis not present

## 2016-06-13 DIAGNOSIS — Z79899 Other long term (current) drug therapy: Secondary | ICD-10-CM | POA: Insufficient documentation

## 2016-06-13 DIAGNOSIS — M545 Low back pain: Secondary | ICD-10-CM | POA: Diagnosis not present

## 2016-06-13 DIAGNOSIS — R1084 Generalized abdominal pain: Secondary | ICD-10-CM

## 2016-06-13 DIAGNOSIS — R748 Abnormal levels of other serum enzymes: Secondary | ICD-10-CM

## 2016-06-13 DIAGNOSIS — E119 Type 2 diabetes mellitus without complications: Secondary | ICD-10-CM | POA: Insufficient documentation

## 2016-06-13 DIAGNOSIS — Z7984 Long term (current) use of oral hypoglycemic drugs: Secondary | ICD-10-CM | POA: Diagnosis not present

## 2016-06-13 DIAGNOSIS — G8929 Other chronic pain: Secondary | ICD-10-CM | POA: Insufficient documentation

## 2016-06-13 DIAGNOSIS — Z09 Encounter for follow-up examination after completed treatment for conditions other than malignant neoplasm: Secondary | ICD-10-CM

## 2016-06-13 DIAGNOSIS — K2970 Gastritis, unspecified, without bleeding: Secondary | ICD-10-CM | POA: Diagnosis not present

## 2016-06-13 DIAGNOSIS — K5900 Constipation, unspecified: Secondary | ICD-10-CM

## 2016-06-13 DIAGNOSIS — R109 Unspecified abdominal pain: Secondary | ICD-10-CM | POA: Diagnosis not present

## 2016-06-13 LAB — CBC WITH DIFFERENTIAL/PLATELET
BASOS PCT: 1 %
Basophils Absolute: 64 cells/uL (ref 0–200)
Eosinophils Absolute: 128 cells/uL (ref 15–500)
Eosinophils Relative: 2 %
HEMATOCRIT: 40.1 % (ref 35.0–45.0)
Hemoglobin: 13.1 g/dL (ref 11.7–15.5)
LYMPHS PCT: 20 %
Lymphs Abs: 1280 cells/uL (ref 850–3900)
MCH: 27.9 pg (ref 27.0–33.0)
MCHC: 32.7 g/dL (ref 32.0–36.0)
MCV: 85.5 fL (ref 80.0–100.0)
MONO ABS: 512 {cells}/uL (ref 200–950)
MPV: 9.3 fL (ref 7.5–12.5)
Monocytes Relative: 8 %
NEUTROS ABS: 4416 {cells}/uL (ref 1500–7800)
Neutrophils Relative %: 69 %
PLATELETS: 257 10*3/uL (ref 140–400)
RBC: 4.69 MIL/uL (ref 3.80–5.10)
RDW: 15.9 % — AB (ref 11.0–15.0)
WBC: 6.4 10*3/uL (ref 3.8–10.8)

## 2016-06-13 LAB — CMP AND LIVER
ALT: 16 U/L (ref 6–29)
AST: 19 U/L (ref 10–35)
Albumin: 4.1 g/dL (ref 3.6–5.1)
Alkaline Phosphatase: 55 U/L (ref 33–115)
BUN: 9 mg/dL (ref 7–25)
Bilirubin, Direct: 0.1 mg/dL (ref <=0.2)
CALCIUM: 9 mg/dL (ref 8.6–10.2)
CHLORIDE: 102 mmol/L (ref 98–110)
CO2: 25 mmol/L (ref 20–31)
CREATININE: 0.59 mg/dL (ref 0.50–1.10)
Glucose, Bld: 124 mg/dL — ABNORMAL HIGH (ref 65–99)
Indirect Bilirubin: 0.4 mg/dL (ref 0.2–1.2)
Potassium: 4.3 mmol/L (ref 3.5–5.3)
Sodium: 137 mmol/L (ref 135–146)
Total Bilirubin: 0.5 mg/dL (ref 0.2–1.2)
Total Protein: 6.9 g/dL (ref 6.1–8.1)

## 2016-06-13 LAB — LIPASE: Lipase: 11 U/L (ref 7–60)

## 2016-06-13 MED ORDER — POLYETHYLENE GLYCOL 3350 17 GM/SCOOP PO POWD: 17.0000 g | Freq: Two times a day (BID) | ORAL | Status: DC | PRN

## 2016-06-13 NOTE — Progress Notes (Signed)
Chief Complaint: "I had to go to the hospital while I was on vacation last week"  Subjective: This is a 45 year old female with a history of diabetes mellitus type 2, chronic low back pain and hypertension. She was recently on vacation and on this past Sunday ended up in the emergency room. She states that while on vacation she was consuming large amounts of alcohol. She also had been off of her blood sugar and blood pressure medications for 2-3 days. On this past Sunday she had a sharp epigastric discomfort without any radiation. No nausea or vomiting. No diarrhea. No fever or chills. Her family member noticed her grimacing and took her to the local emergency room. There her labs were significant for an elevated lipase. Her stool was heme positive. She admits to frequently being constipated and having blood in the stool occasionally and when she wipes occasionally. No melena. She was diagnosed with gastritis and started on Prilosec 40 mg daily. She did get her prescription filled. She feels better since taking the medication.   ROS:  GEN: denies fever or chills, denies change in weight Skin: denies lesions or rashes LUNGS: denies SHOB, dyspnea, PND, orthopnea CV: denies CP or palpitations ABD: + abd pain, no N or V    Objective: Afebrile  Physical Exam: General: in no acute distress. Heart: Normal  s1 &s2  Regular rate and rhythm, without murmurs, rubs, gallops. Lungs: Clear to auscultation bilaterally. Abdomen: Soft, nontender, nondistended, positive bowel sounds. BENIGN!   Medications: Prior to Admission medications   Medication Sig Start Date End Date Taking? Authorizing Provider  amLODipine (NORVASC) 10 MG tablet Take 1 tablet (10 mg total) by mouth daily. 01/18/16   Lance Bosch, NP  atorvastatin (LIPITOR) 10 MG tablet Take 1 tablet (10 mg total) by mouth daily. 01/31/16   Lance Bosch, NP  Blood Glucose Monitoring Suppl (TRUE METRIX METER) w/Device KIT Check sugar 3 times per  day for E11.9 01/18/16   Lance Bosch, NP  butalbital-acetaminophen-caffeine (FIORICET) 50-325-40 MG tablet Take 1 tablet by mouth every 8 (eight) hours as needed for headache. 10/13/15 10/12/16  Lance Bosch, NP  cyclobenzaprine (FLEXERIL) 10 MG tablet Take 1 tablet (10 mg total) by mouth at bedtime. 09/22/15   Lance Bosch, NP  ferrous sulfate 325 (65 FE) MG tablet Take 1 tablet (325 mg total) by mouth daily with breakfast. 11/03/15   Lance Bosch, NP  GABAPENTIN PO Take by mouth.    Historical Provider, MD  glucose blood (TRUE METRIX BLOOD GLUCOSE TEST) test strip Check sugar 3 times per day for E11.9 01/18/16   Lance Bosch, NP  ibuprofen (ADVIL,MOTRIN) 800 MG tablet Take 1 tablet (800 mg total) by mouth every 8 (eight) hours as needed. 01/18/16   Lance Bosch, NP  loratadine (CLARITIN) 10 MG tablet Take 1 tablet (10 mg total) by mouth daily as needed for allergies. 04/11/15   Lorayne Marek, MD  metFORMIN (GLUCOPHAGE) 500 MG tablet Take 1 tablet (500 mg total) by mouth daily with breakfast. 01/18/16   Lance Bosch, NP  penicillin v potassium (VEETID) 500 MG tablet Take 1 tablet (500 mg total) by mouth 4 (four) times daily. 03/03/16   Liberty Handy, MD  polyethylene glycol powder (GLYCOLAX/MIRALAX) powder Take 17 g by mouth 2 (two) times daily as needed. 06/13/16   Lindberg Zenon Daneil Dan, PA-C  traMADol (ULTRAM) 50 MG tablet TAKE 1 TABLET BY MOUTH EVERY 8 HOURS AS NEEDED FOR MODERATE PAIN  05/09/16   Tresa Garter, MD  TRUEPLUS LANCETS 28G MISC Check sugar 3 times per day for E11.9 01/18/16   Lance Bosch, NP    Assessment: 1. Acute abdominal pain-improved/gastritis 2. Elevated Lipase (likely from acute ETOH while on vacation) 3. Heme + stool/Constipation  Plan: Cont PPI Recheck lipase, CMP, CBC Miralax  Follow up:4 weeks for routine health maintenance  The patient was given clear instructions to go to ER or return to medical center if symptoms don't improve, worsen or new problems  develop. The patient verbalized understanding. The patient was told to call to get lab results if they haven't heard anything in the next week.   This note has been created with Surveyor, quantity. Any transcriptional errors are unintentional.   Zettie Pho, PA-C 06/13/2016, 10:23 AM

## 2016-06-13 NOTE — Progress Notes (Signed)
Patient is here for abdominal pain.  Patient complains of upper abdominal pain being present for the past month. Pain is scaled at a 5 currently.  Patient has not taken medication today and patient has not eaten today.

## 2016-06-13 NOTE — Addendum Note (Signed)
Addended by: Trecia Rogers on: 06/13/2016 10:35 AM   Modules accepted: Medications

## 2016-06-26 ENCOUNTER — Telehealth: Payer: Self-pay | Admitting: *Deleted

## 2016-06-26 NOTE — Telephone Encounter (Signed)
-----   Message from Brayton Caves, [place] sent at 06/17/2016  9:47 AM EDT ----- Regarding: lab results Please let her know that all of her blood work came back normal. No changes needed. Thanks.   ----- Message -----    From: [facility] Interface    Sent: 06/13/2016   9:55 PM      To: Brayton Caves, PA-C

## 2016-06-26 NOTE — Telephone Encounter (Signed)
Patient verified DOB Patient is aware of blood work being normal and no changes being made. Patient is aware of calling before this week is out to schedule for July to re-establish care. Patient expressed her understanding and had no further questions at this time.

## 2016-07-11 MED FILL — metFORMIN HCL 500 MG TABS: 500 | 30 days supply | Qty: 30 | Fill #3

## 2016-07-11 MED FILL — AMLODIPINE BESYLATE 10 MG T: 10 | 30 days supply | Qty: 30 | Fill #3

## 2016-07-11 MED FILL — ATORVASTATIN 10 MG TABLET: 10 | 30 days supply | Qty: 30 | Fill #4

## 2016-08-08 ENCOUNTER — Other Ambulatory Visit: Payer: Self-pay | Admitting: Internal Medicine

## 2016-08-08 DIAGNOSIS — E119 Type 2 diabetes mellitus without complications: Secondary | ICD-10-CM

## 2016-09-27 DIAGNOSIS — Z23 Encounter for immunization: Secondary | ICD-10-CM | POA: Diagnosis not present

## 2016-10-01 DIAGNOSIS — E119 Type 2 diabetes mellitus without complications: Secondary | ICD-10-CM | POA: Diagnosis not present

## 2016-10-01 DIAGNOSIS — I10 Essential (primary) hypertension: Secondary | ICD-10-CM | POA: Diagnosis not present

## 2016-10-01 DIAGNOSIS — M545 Low back pain: Secondary | ICD-10-CM | POA: Diagnosis not present

## 2016-10-01 DIAGNOSIS — D509 Iron deficiency anemia, unspecified: Secondary | ICD-10-CM | POA: Diagnosis not present

## 2016-10-01 DIAGNOSIS — E785 Hyperlipidemia, unspecified: Secondary | ICD-10-CM | POA: Diagnosis not present

## 2016-10-01 DIAGNOSIS — E1140 Type 2 diabetes mellitus with diabetic neuropathy, unspecified: Secondary | ICD-10-CM | POA: Diagnosis not present

## 2016-11-09 DIAGNOSIS — F17200 Nicotine dependence, unspecified, uncomplicated: Secondary | ICD-10-CM | POA: Diagnosis not present

## 2016-11-09 DIAGNOSIS — J209 Acute bronchitis, unspecified: Secondary | ICD-10-CM | POA: Diagnosis not present

## 2016-11-09 DIAGNOSIS — J019 Acute sinusitis, unspecified: Secondary | ICD-10-CM | POA: Diagnosis not present

## 2017-03-22 DIAGNOSIS — R062 Wheezing: Secondary | ICD-10-CM | POA: Diagnosis not present

## 2017-03-22 DIAGNOSIS — R05 Cough: Secondary | ICD-10-CM | POA: Diagnosis not present

## 2017-03-22 DIAGNOSIS — R0602 Shortness of breath: Secondary | ICD-10-CM | POA: Diagnosis not present

## 2017-03-22 DIAGNOSIS — I10 Essential (primary) hypertension: Secondary | ICD-10-CM | POA: Diagnosis not present

## 2017-06-04 DIAGNOSIS — E1140 Type 2 diabetes mellitus with diabetic neuropathy, unspecified: Secondary | ICD-10-CM | POA: Diagnosis not present

## 2017-06-04 DIAGNOSIS — D259 Leiomyoma of uterus, unspecified: Secondary | ICD-10-CM | POA: Diagnosis not present

## 2017-06-04 DIAGNOSIS — Z1389 Encounter for screening for other disorder: Secondary | ICD-10-CM | POA: Diagnosis not present

## 2017-06-04 DIAGNOSIS — Z13228 Encounter for screening for other metabolic disorders: Secondary | ICD-10-CM | POA: Diagnosis not present

## 2017-06-04 DIAGNOSIS — R1031 Right lower quadrant pain: Secondary | ICD-10-CM | POA: Diagnosis not present

## 2017-06-04 DIAGNOSIS — Z136 Encounter for screening for cardiovascular disorders: Secondary | ICD-10-CM | POA: Diagnosis not present

## 2017-06-14 DIAGNOSIS — Z1231 Encounter for screening mammogram for malignant neoplasm of breast: Secondary | ICD-10-CM | POA: Diagnosis not present

## 2017-06-14 DIAGNOSIS — Z01419 Encounter for gynecological examination (general) (routine) without abnormal findings: Secondary | ICD-10-CM | POA: Diagnosis not present

## 2017-06-14 DIAGNOSIS — D259 Leiomyoma of uterus, unspecified: Secondary | ICD-10-CM | POA: Diagnosis not present

## 2017-06-14 DIAGNOSIS — N83202 Unspecified ovarian cyst, left side: Secondary | ICD-10-CM | POA: Diagnosis not present

## 2017-06-14 DIAGNOSIS — Z124 Encounter for screening for malignant neoplasm of cervix: Secondary | ICD-10-CM | POA: Diagnosis not present

## 2017-06-14 DIAGNOSIS — L723 Sebaceous cyst: Secondary | ICD-10-CM | POA: Diagnosis not present

## 2018-03-25 ENCOUNTER — Emergency Department (HOSPITAL_COMMUNITY): Payer: Medicare Other

## 2018-03-25 ENCOUNTER — Encounter (HOSPITAL_COMMUNITY): Payer: Self-pay | Admitting: Emergency Medicine

## 2018-03-25 ENCOUNTER — Ambulatory Visit (HOSPITAL_COMMUNITY): Payer: Medicare Other

## 2018-03-25 ENCOUNTER — Emergency Department (HOSPITAL_COMMUNITY)
Admission: EM | Admit: 2018-03-25 | Discharge: 2018-03-25 | Disposition: A | Discharge status: Home / Self Care | Payer: Medicare Other | Attending: Emergency Medicine | Admitting: Emergency Medicine

## 2018-03-25 DIAGNOSIS — R1084 Generalized abdominal pain: Secondary | ICD-10-CM

## 2018-03-25 DIAGNOSIS — R079 Chest pain, unspecified: Secondary | ICD-10-CM

## 2018-03-25 DIAGNOSIS — I10 Essential (primary) hypertension: Secondary | ICD-10-CM

## 2018-03-25 DIAGNOSIS — D259 Leiomyoma of uterus, unspecified: Secondary | ICD-10-CM | POA: Diagnosis not present

## 2018-03-25 DIAGNOSIS — Z87891 Personal history of nicotine dependence: Secondary | ICD-10-CM | POA: Diagnosis not present

## 2018-03-25 DIAGNOSIS — Z7984 Long term (current) use of oral hypoglycemic drugs: Secondary | ICD-10-CM | POA: Diagnosis not present

## 2018-03-25 DIAGNOSIS — R0789 Other chest pain: Secondary | ICD-10-CM | POA: Diagnosis not present

## 2018-03-25 DIAGNOSIS — E1165 Type 2 diabetes mellitus with hyperglycemia: Secondary | ICD-10-CM | POA: Diagnosis not present

## 2018-03-25 DIAGNOSIS — D649 Anemia, unspecified: Secondary | ICD-10-CM | POA: Diagnosis not present

## 2018-03-25 DIAGNOSIS — Z79899 Other long term (current) drug therapy: Secondary | ICD-10-CM | POA: Insufficient documentation

## 2018-03-25 DIAGNOSIS — E119 Type 2 diabetes mellitus without complications: Secondary | ICD-10-CM

## 2018-03-25 DIAGNOSIS — R739 Hyperglycemia, unspecified: Secondary | ICD-10-CM

## 2018-03-25 LAB — I-STAT TROPONIN, ED
TROPONIN I, POC: 0.01 ng/mL (ref 0.00–0.08)
Troponin i, poc: 0 ng/mL (ref 0.00–0.08)

## 2018-03-25 LAB — COMPREHENSIVE METABOLIC PANEL
ALBUMIN: 3.6 g/dL (ref 3.5–5.0)
ALT: 18 U/L (ref 14–54)
ANION GAP: 8 (ref 5–15)
AST: 26 U/L (ref 15–41)
Alkaline Phosphatase: 60 U/L (ref 38–126)
BILIRUBIN TOTAL: 0.4 mg/dL (ref 0.3–1.2)
BUN: 6 mg/dL (ref 6–20)
CALCIUM: 8.9 mg/dL (ref 8.9–10.3)
CO2: 25 mmol/L (ref 22–32)
Chloride: 104 mmol/L (ref 101–111)
Creatinine, Ser: 0.7 mg/dL (ref 0.44–1.00)
GFR calc non Af Amer: >60 mL/min (ref >60)
GLUCOSE: 196 mg/dL — AB (ref 65–99)
POTASSIUM: 3.4 mmol/L — AB (ref 3.5–5.1)
SODIUM: 137 mmol/L (ref 135–145)
TOTAL PROTEIN: 7 g/dL (ref 6.5–8.1)

## 2018-03-25 LAB — CBC WITH DIFFERENTIAL/PLATELET
BASOS PCT: 0 %
Basophils Absolute: 0 10*3/uL (ref 0.0–0.1)
EOS ABS: 0.1 10*3/uL (ref 0.0–0.7)
Eosinophils Relative: 0 %
HCT: 33.5 % — ABNORMAL LOW (ref 36.0–46.0)
HEMOGLOBIN: 10.4 g/dL — AB (ref 12.0–15.0)
Lymphocytes Relative: 6 %
Lymphs Abs: 1 10*3/uL (ref 0.7–4.0)
MCH: 22.2 pg — ABNORMAL LOW (ref 26.0–34.0)
MCHC: 31 g/dL (ref 30.0–36.0)
MCV: 71.6 fL — ABNORMAL LOW (ref 78.0–100.0)
MONOS PCT: 4 %
Monocytes Absolute: 0.7 10*3/uL (ref 0.1–1.0)
NEUTROS ABS: 14.6 10*3/uL — AB (ref 1.7–7.7)
NEUTROS PCT: 90 %
Platelets: 284 10*3/uL (ref 150–400)
RBC: 4.68 MIL/uL (ref 3.87–5.11)
RDW: 19.6 % — ABNORMAL HIGH (ref 11.5–15.5)
WBC: 16.4 10*3/uL — ABNORMAL HIGH (ref 4.0–10.5)

## 2018-03-25 LAB — LIPASE, BLOOD: Lipase: 104 U/L — ABNORMAL HIGH (ref 11–51)

## 2018-03-25 LAB — I-STAT BETA HCG BLOOD, ED (MC, WL, AP ONLY): I-stat hCG, quantitative: <5 m[IU]/mL (ref <5)

## 2018-03-25 MED ORDER — IOPAMIDOL (ISOVUE-300) INJECTION 61%
INTRAVENOUS | Status: AC
  Administered 2018-03-25: 100 mL
  Filled 2018-03-25: qty 100

## 2018-03-25 MED ORDER — NAPROXEN 500 MG PO TABS: 500.0000 mg | ORAL_TABLET | Freq: Two times a day (BID) | ORAL | 0 refills | Status: DC

## 2018-03-25 MED ORDER — ONDANSETRON HCL 4 MG/2ML IJ SOLN
4.0000 mg | Freq: Once | INTRAMUSCULAR | Status: AC
  Administered 2018-03-25: 4 mg via INTRAVENOUS
  Filled 2018-03-25: qty 2

## 2018-03-25 MED ORDER — MORPHINE SULFATE (PF) 4 MG/ML IV SOLN
8.0000 mg | Freq: Once | INTRAVENOUS | Status: AC
  Administered 2018-03-25: 8 mg via INTRAVENOUS
  Filled 2018-03-25: qty 2

## 2018-03-25 MED ORDER — FERROUS SULFATE 325 (65 FE) MG PO TABS: 325.0000 mg | ORAL_TABLET | Freq: Every day | ORAL | 1 refills | Status: DC

## 2018-03-25 MED ORDER — AMLODIPINE BESYLATE 10 MG PO TABS: 10.0000 mg | ORAL_TABLET | Freq: Every day | ORAL | 1 refills | Status: DC

## 2018-03-25 MED ORDER — METFORMIN HCL 500 MG PO TABS: 500.0000 mg | ORAL_TABLET | Freq: Every day | ORAL | 1 refills | Status: DC

## 2018-03-25 MED ORDER — SODIUM CHLORIDE 0.9 % IV BOLUS
1000.0000 mL | Freq: Once | INTRAVENOUS | Status: AC
  Administered 2018-03-25: 1000 mL via INTRAVENOUS

## 2018-03-25 NOTE — ED Provider Notes (Signed)
[facility] EMERGENCY DEPARTMENT Provider Note   CSN: [id number] Arrival date & time: 03/25/18  1342     History   Chief Complaint Chief Complaint  Patient presents with  . Chest Pain  . Shortness of Breath    HPI Karen Tanner is a 47 y.o. female.  HPI  47 year old female with a history of diabetes and hypertension presents with right-sided chest and abdominal pain.  Started this morning at 11:00 and woke her from sleep.  She states the pain mostly feels a tightness and is a severe 10/10.  Patient states that the pain is worsened with any type of movement or lying flat.  She tried to take a hot shower to see if it would help.  She is no history she denies any recent travel, surgery, or known history of DVT/PE.  No leg swelling.  She took 2 200 mg ibuprofen without relief.  No vomiting but has had some nausea.  There is no back pain.  She ate without any change in her pain.  Denies any significant cough.  She endorses shortness of breath but when clarified, she states that the pain hurts worse with inspiration.  She has not been on any of her diabetes or hypertension meds since they ran out about a year ago when she was living in [place].  Past Medical History:  Diagnosis Date  . Anemia   . Diabetes mellitus without complication ([place])   . Hypertension     Patient Active Problem List   Diagnosis Date Noted  . Diabetes type 2, controlled ([place]) 05/21/2016  . History of back surgery 10/26/2014  . History of anemia 10/26/2014  . Essential hypertension 10/26/2014  . Chronic low back pain 10/26/2014  . Menorrhagia with regular cycle 10/26/2014    Past Surgical History:  Procedure Laterality Date  . BACK SURGERY     Bone fusion     OB History   None      Home Medications    Prior to Admission medications   Medication Sig Start Date End Date Taking? Authorizing Provider  amLODipine (NORVASC) 10 MG tablet Take 1 tablet (10 mg total) by mouth  daily. 03/25/18   Sherwood Gambler, MD  atorvastatin (LIPITOR) 10 MG tablet Take 1 tablet (10 mg total) by mouth daily. Patient not taking: Reported on 03/25/2018 01/31/16   Lance Bosch, NP  cyclobenzaprine (FLEXERIL) 10 MG tablet Take 1 tablet (10 mg total) by mouth at bedtime. Patient not taking: Reported on 03/25/2018 09/22/15   Lance Bosch, NP  ferrous sulfate 325 (65 FE) MG tablet Take 1 tablet (325 mg total) by mouth daily with breakfast. 03/25/18   Sherwood Gambler, MD  loratadine (CLARITIN) 10 MG tablet Take 1 tablet (10 mg total) by mouth daily as needed for allergies. Patient not taking: Reported on 03/25/2018 04/11/15   Lorayne Marek, MD  metFORMIN (GLUCOPHAGE) 500 MG tablet Take 1 tablet (500 mg total) by mouth daily with breakfast. 03/25/18   Sherwood Gambler, MD  naproxen (NAPROSYN) 500 MG tablet Take 1 tablet (500 mg total) by mouth 2 (two) times daily. 03/25/18   Sherwood Gambler, MD  polyethylene glycol powder (GLYCOLAX/MIRALAX) powder Take 17 g by mouth 2 (two) times daily as needed. Patient not taking: Reported on 03/25/2018 06/13/16   Brayton Caves, PA-C    Family History Family History  Problem Relation Age of Onset  . Diabetes Mother   . Hyperlipidemia Mother   . Heart disease  Mother   . Diabetes Father   . Diabetes Maternal Grandmother     Social History Social History   Tobacco Use  . Smoking status: Former Smoker    Packs/day: 0.25    Years: 14.00    Pack years: 3.50  . Smokeless tobacco: Never Used  . Tobacco comment: Smoking 6-7 cigs per day  Substance Use Topics  . Alcohol use: Yes    Alcohol/week: 0.0 oz  . Drug use: Not on file     Allergies   Patient has no known allergies.   Review of Systems Review of Systems  Constitutional: Negative for fever.  Respiratory: Positive for shortness of breath. Negative for cough.   Cardiovascular: Positive for chest pain. Negative for leg swelling.  Gastrointestinal: Positive for abdominal pain and nausea.  Negative for vomiting.  Musculoskeletal: Negative for back pain.  All other systems reviewed and are negative.    Physical Exam Updated Vital Signs BP (!) 148/73   Pulse 80   Temp 97.8 F (36.6 C) (Oral)   Resp 18   Ht 5\' 7"  (1.702 m)   Wt 113.4 kg (250 lb)   LMP 03/25/2018   SpO2 95%   BMI 39.16 kg/m   Physical Exam  Constitutional: She is oriented to person, place, and time. She appears well-developed and well-nourished.  Non-toxic appearance. She does not appear ill.  HENT:  Head: Normocephalic and atraumatic.  Right Ear: External ear normal.  Left Ear: External ear normal.  Nose: Nose normal.  Eyes: Right eye exhibits no discharge. Left eye exhibits no discharge.  Cardiovascular: Normal rate, regular rhythm and normal heart sounds.  Pulmonary/Chest: Effort normal and breath sounds normal. She exhibits tenderness.  Tenderness to minimal palpation of lower right anterior chest wall and mid chest wall    Abdominal: Soft. There is tenderness (mild generalized tenderness but most severe in RUQ). There is no CVA tenderness.  Musculoskeletal:       Cervical back: She exhibits no tenderness.       Thoracic back: She exhibits no tenderness.  Neurological: She is alert and oriented to person, place, and time.  Skin: Skin is warm and dry.  Nursing note and vitals reviewed.    ED Treatments / Results  Labs (all labs ordered are listed, but only abnormal results are displayed) Labs Reviewed  COMPREHENSIVE METABOLIC PANEL - Abnormal; Notable for the following components:      Result Value   Potassium 3.4 (*)    Glucose, Bld 196 (*)    All other components within normal limits  LIPASE, BLOOD - Abnormal; Notable for the following components:   Lipase 104 (*)    All other components within normal limits  CBC WITH DIFFERENTIAL/PLATELET - Abnormal; Notable for the following components:   WBC 16.4 (*)    Hemoglobin 10.4 (*)    HCT 33.5 (*)    MCV 71.6 (*)    MCH 22.2 (*)      RDW 19.6 (*)    Neutro Abs 14.6 (*)    All other components within normal limits  I-STAT TROPONIN, ED  I-STAT BETA HCG BLOOD, ED ([facility], [facility], [facility] ONLY)  I-STAT TROPONIN, ED    EKG  EKG Interpretation  Date/Time:  Tuesday March 25 2018 13:50:54 EDT Ventricular Rate:  69 PR Interval:    QRS Duration: 80 QT Interval:  393 QTC Calculation: 421 R Axis:   -2 Text Interpretation:  Sinus rhythm Atrial premature complex Probable left atrial enlargement No old  tracing to compare Confirmed by Dorie Rank [phone]) on 03/25/2018 1:57:23 PM  EKG Interpretation  Date/Time:  Tuesday March 25 2018 19:07:24 EDT Ventricular Rate:  83 PR Interval:    QRS Duration: 84 QT Interval:  381 QTC Calculation: 448 R Axis:   22 Text Interpretation:  Sinus rhythm Probable left atrial enlargement no acute ST/T changes no significant change compared ot earlier in the day Confirmed by Sherwood Gambler [phone]) on 03/25/2018 7:23:55 PM   Radiology Dg Chest 2 View  Result Date: 03/25/2018 CLINICAL DATA:  Right-sided chest pain for 1 day. EXAM: CHEST - 2 VIEW COMPARISON:  None FINDINGS: Mild cardiac enlargement. Pulmonary vascular congestion noted. No pleural effusion or pulmonary edema. No airspace opacities. Spondylosis noted within the thoracic spine. IMPRESSION: 1. Cardiac enlargement and pulmonary vascular congestion. Electronically Signed   By: Kerby Moors M.D.   On: 03/25/2018 14:59   Ct Abdomen Pelvis W Contrast  Result Date: 03/25/2018 CLINICAL DATA:  Upper and lower abdominal pain EXAM: CT ABDOMEN AND PELVIS WITH CONTRAST TECHNIQUE: Multidetector CT imaging of the abdomen and pelvis was performed using the standard protocol following bolus administration of intravenous contrast. CONTRAST:  100 mL Isovue-300 intravenous COMPARISON:  Ultrasound 03/25/2018 FINDINGS: Lower chest: Lung bases demonstrate no acute consolidation or effusion. Mild cardiomegaly. Hepatobiliary: No focal liver abnormality is seen. No  gallstones, gallbladder wall thickening, or biliary dilatation. Pancreas: Unremarkable. No pancreatic ductal dilatation or surrounding inflammatory changes. Spleen: Normal in size without focal abnormality. Adrenals/Urinary Tract: Adrenal glands are within normal limits. No hydronephrosis. Bladder unremarkable. Stomach/Bowel: Stomach nonenlarged. No dilated small bowel. Normal appendix. No colon wall thickening. Vascular/Lymphatic: Nonaneurysmal aorta. Subcentimeter retroperitoneal lymph nodes. Reproductive: Lobulated uterus with multiple masses. Dominant right pelvic mass measuring 7.7 cm, likely exophytic fibroid. Tubular fluid density structure paralleling the left uterus, suspect for hydrosalpinx. Probable 3.3 cm left ovarian cyst. Other: Negative for free air.  Trace free fluid in the pelvis. Musculoskeletal: Posterior stabilization rods and fixating screws at L5-S1 with interbody device. IMPRESSION: 1. No CT evidence for acute intra-abdominal or pelvic abnormality. 2. Fibroid uterus. Probable left hydrosalpinx. Probable 3.3 cm left ovarian cyst, recommend 6-12 week sonographic follow-up. 3. Mild cardiomegaly Electronically Signed   By: Donavan Foil M.D.   On: 03/25/2018 19:41   US Abdomen Limited Ruq  Result Date: 03/25/2018 CLINICAL DATA:  Right upper quadrant and right chest pain. EXAM: ULTRASOUND ABDOMEN LIMITED RIGHT UPPER QUADRANT COMPARISON:  None. FINDINGS: Due to decreased patient mobility, left lateral decubitus images of the gallbladder could not be obtained. Gallbladder: No gallstones or wall thickening visualized. No sonographic Murphy sign noted by sonographer. Common bile duct: Diameter: 3 mm Liver: No focal lesion identified. Within normal limits in parenchymal echogenicity. Portal vein is patent on color Doppler imaging with normal direction of blood flow towards the liver. IMPRESSION: Negative right upper quadrant ultrasound. Electronically Signed   By: Logan Bores M.D.   On: 03/25/2018  16:18    Procedures Procedures (including critical care time)  Angiocath insertion Performed by: Ephraim Hamburger  Consent: Verbal consent obtained. Risks and benefits: risks, benefits and alternatives were discussed Time out: Immediately prior to procedure a "time out" was called to verify the correct patient, procedure, equipment, support staff and site/side marked as required.  Preparation: Patient was prepped and draped in the usual sterile fashion.  Vein Location: right basilic  Ultrasound Guided  Gauge: 20  Normal blood return and flush without difficulty Patient tolerance: Patient tolerated the procedure well with  no immediate complications.    Medications Ordered in ED Medications  sodium chloride 0.9 % bolus 1,000 mL (0 mLs Intravenous Stopped 03/25/18 1626)  morphine 4 MG/ML injection 8 mg (8 mg Intravenous Given 03/25/18 1525)  ondansetron (ZOFRAN) injection 4 mg (4 mg Intravenous Given 03/25/18 1525)  iopamidol (ISOVUE-300) 61 % injection (100 mLs  Contrast Given 03/25/18 1846)     Initial Impression / Assessment and Plan / ED Course  I have reviewed the triage vital signs and the nursing notes.  Pertinent labs & imaging results that were available during my care of the patient were reviewed by me and considered in my medical decision making (see chart for details).     Patient's workup is overall unremarkable.  Given the chest/abdominal pain, ultrasound obtained but does not show an obvious gallbladder pathology.  CT obtained given she does have diffuse abdominal pain as well as well as a mildly elevated lipase.  This is also unremarkable except for fibroids which she is aware of.  Hemoglobin mildly low compared to prior but still over 10.  She states she has heavy periods from the fibroids and recently had a month-long menstrual cycle.  She denies bloody stools or melena.  She is not actively bleeding.  Otherwise she has 2- troponins and benign ECGs.  I doubt this  is cardiac.  She has no tachycardia, hypoxia, or risk factors for PE and is otherwise PERC negative, Thus no further indication for PE workup.  Overall she appears stable for discharge home.  I have counseled her on the importance of taking medicine for hypertension and diabetes.  She has been given refills for this.  She will be started on iron which she is to be on for her anemia as well.  Will refer to a PCP as well as GYN. Discussed return precautions.  Final Clinical Impressions(s) / ED Diagnoses   Final diagnoses:  Right-sided chest pain  Generalized abdominal pain  Uterine leiomyoma, unspecified location  Anemia, unspecified type  Essential hypertension  Hyperglycemia    ED Discharge Orders        Ordered    amLODipine (NORVASC) 10 MG tablet  Daily     03/25/18 2018    ferrous sulfate 325 (65 FE) MG tablet  Daily with breakfast     03/25/18 2018    metFORMIN (GLUCOPHAGE) 500 MG tablet  Daily with breakfast     03/25/18 2018    naproxen (NAPROSYN) 500 MG tablet  2 times daily     03/25/18 2018       Sherwood Gambler, MD 03/25/18 2326

## 2018-03-25 NOTE — ED Provider Notes (Signed)
MSE was initiated and I personally evaluated the patient and placed orders (if any) at  2:34 PM on March 25, 2018.  The patient appears stable so that the remainder of the MSE may be completed by another provider.  Patient presents with complaint of mid chest pain with radiation to the right lateral ribs and right upper abdomen starting acutely at about 11 AM today.  She states that she is short of breath because it hurts when she takes a deep breath.  Pain is also worse with movement and palpation.  No fever, cough, hemoptysis. No h/o blood clot. Patient is a diabetic.  She states that she has not been taking her diabetes medications recently because her medications have expired.    Trouble obtaining lab work due to coagulation.  IV team consult placed.  Exam:  Gen NAD; Heart RRR, nml S1,S2, no m/r/g, patient with generalized tenderness over the central chest and R lateral ribs which seems to reproduce her pain; Lungs CTAB; Abd soft, RUQ and R lower rib tenderness, no rebound or guarding; Ext 2+ pedal pulses bilaterally, no edema, no clinical signs of DVT.    EKG Interpretation  Date/Time:  Tuesday March 25 2018 13:50:54 EDT Ventricular Rate:  69 PR Interval:    QRS Duration: 80 QT Interval:  393 QTC Calculation: 421 R Axis:   -2 Text Interpretation:  Sinus rhythm Atrial premature complex Probable left atrial enlargement No old tracing to compare Confirmed by Dorie Rank [phone]) on 03/25/2018 1:57:23 PM         Carlisle Cater, PA-C 03/25/18 1438    Dorie Rank, MD 03/27/18 1150

## 2018-03-25 NOTE — Progress Notes (Signed)
MD has obtained IV access. Fran Lowes, RN VAST

## 2018-03-25 NOTE — ED Notes (Signed)
Patient transported to Ultrasound 

## 2018-03-25 NOTE — ED Notes (Signed)
Attempted IV x 2.   IV team consulted.  

## 2018-03-25 NOTE — ED Triage Notes (Signed)
Pt arrived via ems from home. Reports having chest pain and short of breath. EMS reports giving patient  5mg  Albuterol. Patient is A&O X 4, reports pain of 9 on central chest that 'moves' to right side 'rib area'

## 2018-03-25 NOTE — Discharge Instructions (Addendum)
There is no evidence of a heart attack or an acute intra-abdominal emergency today.  However your CT scan did show a fibroid uterus and an ovarian cyst.  You will need to follow-up with an OB/GYN for this.  Your hemoglobin is also a little bit low and so it is advised to start back on iron.  Your blood pressure and glucose are also elevated today and it is very important you take your high blood pressure and diabetes medicines every day as instructed.  You have been given refills of these.  Follow-up with a primary care physician such as at the [facility].  If you develop any worsening or new symptoms return to the ER for evaluation.

## 2018-03-25 NOTE — ED Notes (Signed)
Patient transported to X-ray 

## 2018-03-25 NOTE — Progress Notes (Signed)
Arrived to do stat IV start, patient not in room.  Carolee Rota, RN VAST

## 2018-08-21 ENCOUNTER — Ambulatory Visit: Payer: Medicare Other | Attending: Internal Medicine | Admitting: Internal Medicine

## 2018-08-21 ENCOUNTER — Other Ambulatory Visit: Payer: Self-pay

## 2018-08-21 ENCOUNTER — Encounter: Payer: Self-pay | Admitting: Internal Medicine

## 2018-08-21 VITALS — BP 168/98 | HR 61 | Temp 98.4°F | Resp 16 | Ht 67.5 in | Wt 221.6 lb

## 2018-08-21 DIAGNOSIS — E119 Type 2 diabetes mellitus without complications: Secondary | ICD-10-CM | POA: Diagnosis not present

## 2018-08-21 DIAGNOSIS — F17200 Nicotine dependence, unspecified, uncomplicated: Secondary | ICD-10-CM

## 2018-08-21 DIAGNOSIS — D259 Leiomyoma of uterus, unspecified: Secondary | ICD-10-CM

## 2018-08-21 DIAGNOSIS — I10 Essential (primary) hypertension: Secondary | ICD-10-CM | POA: Diagnosis not present

## 2018-08-21 DIAGNOSIS — N938 Other specified abnormal uterine and vaginal bleeding: Secondary | ICD-10-CM | POA: Diagnosis not present

## 2018-08-21 DIAGNOSIS — D509 Iron deficiency anemia, unspecified: Secondary | ICD-10-CM | POA: Diagnosis not present

## 2018-08-21 DIAGNOSIS — D649 Anemia, unspecified: Secondary | ICD-10-CM

## 2018-08-21 DIAGNOSIS — F17210 Nicotine dependence, cigarettes, uncomplicated: Secondary | ICD-10-CM | POA: Insufficient documentation

## 2018-08-21 DIAGNOSIS — G8929 Other chronic pain: Secondary | ICD-10-CM | POA: Diagnosis not present

## 2018-08-21 DIAGNOSIS — L84 Corns and callosities: Secondary | ICD-10-CM | POA: Diagnosis not present

## 2018-08-21 DIAGNOSIS — Z8249 Family history of ischemic heart disease and other diseases of the circulatory system: Secondary | ICD-10-CM | POA: Diagnosis not present

## 2018-08-21 DIAGNOSIS — M545 Low back pain, unspecified: Secondary | ICD-10-CM

## 2018-08-21 DIAGNOSIS — Z981 Arthrodesis status: Secondary | ICD-10-CM | POA: Insufficient documentation

## 2018-08-21 DIAGNOSIS — Z833 Family history of diabetes mellitus: Secondary | ICD-10-CM | POA: Insufficient documentation

## 2018-08-21 LAB — POCT GLYCOSYLATED HEMOGLOBIN (HGB A1C): Hemoglobin A1C: 6 % — AB (ref 4.0–5.6)

## 2018-08-21 LAB — POCT URINE PREGNANCY: Preg Test, Ur: NEGATIVE

## 2018-08-21 LAB — GLUCOSE, POCT (MANUAL RESULT ENTRY): POC Glucose: 179 mg/dl — AB (ref 70–99)

## 2018-08-21 MED ORDER — FERROUS SULFATE 325 (65 FE) MG PO TABS: 325.0000 mg | ORAL_TABLET | Freq: Every day | ORAL | 1 refills | Status: DC

## 2018-08-21 MED ORDER — TRUE METRIX METER W/DEVICE KIT: PACK | 0 refills | Status: DC

## 2018-08-21 MED ORDER — TRUEPLUS LANCETS 28G MISC: 12 refills | Status: DC

## 2018-08-21 MED ORDER — MELOXICAM 15 MG PO TABS: 15.0000 mg | ORAL_TABLET | Freq: Every day | ORAL | 0 refills | Status: DC

## 2018-08-21 MED ORDER — AMLODIPINE BESYLATE 10 MG PO TABS: 10.0000 mg | ORAL_TABLET | Freq: Every day | ORAL | 1 refills | Status: DC

## 2018-08-21 MED ORDER — VARENICLINE TARTRATE 1 MG PO TABS: 1.0000 mg | ORAL_TABLET | Freq: Two times a day (BID) | ORAL | 1 refills | Status: DC

## 2018-08-21 MED ORDER — ATORVASTATIN CALCIUM 10 MG PO TABS: 10.0000 mg | ORAL_TABLET | Freq: Every day | ORAL | 3 refills | Status: DC

## 2018-08-21 MED ORDER — METFORMIN HCL 500 MG PO TABS: 500.0000 mg | ORAL_TABLET | Freq: Every day | ORAL | 5 refills | Status: DC

## 2018-08-21 MED ORDER — GLUCOSE BLOOD VI STRP: ORAL_STRIP | 12 refills | Status: DC

## 2018-08-21 MED ORDER — VARENICLINE TARTRATE 0.5 MG X 11 & 1 MG X 42 PO MISC: ORAL | 0 refills | Status: DC

## 2018-08-21 MED FILL — MELOXICAM 15 MG TABLET: 15 | 30 days supply | Qty: 30 | Fill #0

## 2018-08-21 MED FILL — AMLODIPINE BESYLATE 10 MG T: 10 | 30 days supply | Qty: 30 | Fill #0

## 2018-08-21 MED FILL — ATORVASTATIN 10 MG TABLET: 10 | 30 days supply | Qty: 30 | Fill #0

## 2018-08-21 MED FILL — metFORMIN HCL 500 MG TABS: 500 | 30 days supply | Qty: 30 | Fill #0

## 2018-08-21 NOTE — Progress Notes (Signed)
Patient ID: Karen Tanner, female    DOB: 05/12/1971  MRN: [id number]  CC: re-establish; Hypertension; and Diabetes   Subjective: Karen Tanner is a 47 y.o. female who presents to reestablish care.  Patient last seen here in 2017. Her concerns today include:  Patient with history of diabetes type 2, HTN, microcytic anemia, uterine fibroids, Chronic LBP (had fusion surgery 2004), tob dep  Out of all meds x 3 mths.  Suppose to be on Metformin, Iron, Lipitor and Norvasc No device to check BS.   Limits salt in foods.  She avoids fried foods.  Drinks juice and Kool-Aid Does a lot of walking. 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 No numbness or tingling in the feet.  tob dep: 1/2 pk a day. She was smoking cigars in past. She quit for 8 yrs at one point.  Restarted after her mother died 3-4 yrs ago.  Would like to give an attempt at quitting.  Anemia:  Hx of fibroids. No period for 2-3 mths then bled for 1 mth; light bleeding the whole time.  Ended 3 days ago.    Requesting to be put on ibuprofen and tramadol for lower back pain.  She reports that she was on tramadol in the past.  Has history of surgery on the lower back for what sounds like disc disease.  She denies any pain in the legs.  No numbness or tingling in the legs. Patient Active Problem List   Diagnosis Date Noted  . Diabetes type 2, controlled ([place]) 05/21/2016  . History of back surgery 10/26/2014  . History of anemia 10/26/2014  . Essential hypertension 10/26/2014  . Chronic low back pain 10/26/2014  . Menorrhagia with regular cycle 10/26/2014     No current outpatient medications on file prior to visit.   No current facility-administered medications on file prior to visit.     No Known Allergies  Social History   Socioeconomic History  . Marital status: Single    Spouse name: Not on file  . Number of children: Not on file  . Years of education: Not on file  . Highest education level:  Not on file  Occupational History  . Not on file  Social Needs  . Financial resource strain: Not on file  . Food insecurity:    Worry: Not on file    Inability: Not on file  . Transportation needs:    Medical: Not on file    Non-medical: Not on file  Tobacco Use  . Smoking status: Former Smoker    Packs/day: 0.25    Years: 14.00    Pack years: 3.50  . Smokeless tobacco: Never Used  . Tobacco comment: Smoking 6-7 cigs per day  Substance and Sexual Activity  . Alcohol use: Yes    Alcohol/week: 0.0 standard drinks  . Drug use: Not on file  . Sexual activity: Not on file  Lifestyle  . Physical activity:    Days per week: Not on file    Minutes per session: Not on file  . Stress: Not on file  Relationships  . Social connections:    Talks on phone: Not on file    Gets together: Not on file    Attends religious service: Not on file    Active member of club or organization: Not on file    Attends meetings of clubs or organizations: Not on file    Relationship status: Not on file  .  Intimate partner violence:    Fear of current or ex partner: Not on file    Emotionally abused: Not on file    Physically abused: Not on file    Forced sexual activity: Not on file  Other Topics Concern  . Not on file  Social History Narrative  . Not on file    Family History  Problem Relation Age of Onset  . Diabetes Mother   . Hyperlipidemia Mother   . Heart disease Mother   . Diabetes Father   . Diabetes Maternal Grandmother     Past Surgical History:  Procedure Laterality Date  . BACK SURGERY     Bone fusion    ROS: Review of Systems Negative except as stated above PHYSICAL EXAM: BP (!) 168/98   Pulse 61   Temp 98.4 F (36.9 C) (Oral)   Resp 16   Ht 5' 7.5" (1.715 m)   Wt 221 lb 9.6 oz (100.5 kg)   SpO2 98%   BMI 34.20 kg/m   Physical Exam  General appearance - alert, well appearing, middle-aged African-American female and in no distress Mental status - normal  mood, behavior, speech, dress, motor activity, and thought processes Mouth - mucous membranes moist, pharynx normal without lesions Neck - supple, no significant adenopathy Chest - clear to auscultation, no wheezes, rales or rhonchi, symmetric air entry Heart - normal rate, regular rhythm, normal S1, S2, no murmurs, rubs, clicks or gallops Musculoskeletal -no tenderness on palpation of lumbar spine.  Straight leg raise negative on both sides.  Power in the lower extremities 5/5 bilaterally. Extremities -no lower extremity edema Diabetic Foot Exam - Simple   Simple Foot Form Visual Inspection See comments:  Yes Sensation Testing Intact to touch and monofilament testing bilaterally:  Yes Pulse Check Posterior Tibialis and Dorsalis pulse intact bilaterally:  Yes Comments 3 cm painful callus on the ball of the left foot laterally    Results for orders placed or performed in visit on 08/21/18  POCT glucose (manual entry)  Result Value Ref Range   POC Glucose 179 (A) 70 - 99 mg/dl  POCT glycosylated hemoglobin (Hb A1C)  Result Value Ref Range   Hemoglobin A1C 6.0 (A) 4.0 - 5.6 %   HbA1c POC (<> result, manual entry)     HbA1c, POC (prediabetic range)     HbA1c, POC (controlled diabetic range)    POCT urine pregnancy  Result Value Ref Range   Preg Test, Ur Negative Negative     Results for orders placed or performed in visit on 08/21/18  POCT glucose (manual entry)  Result Value Ref Range   POC Glucose 179 (A) 70 - 99 mg/dl  POCT glycosylated hemoglobin (Hb A1C)  Result Value Ref Range   Hemoglobin A1C 6.0 (A) 4.0 - 5.6 %   HbA1c POC (<> result, manual entry)     HbA1c, POC (prediabetic range)     HbA1c, POC (controlled diabetic range)    POCT urine pregnancy  Result Value Ref Range   Preg Test, Ur Negative Negative   A1C 6.5 ASSESSMENT AND PLAN: 1. Controlled type 2 diabetes mellitus without complication, without long-term current use of insulin ([place]) Patient advised  that diabetes is under good control and if she wants to we can hold off on restarting the metformin.  However she feels better taking the metformin so we will put her back on 500 mg once a day.  Dietary counseling given.  Advised to eliminate sugary drinks from her  diet. - Microalbumin / creatinine urine ratio - POCT glucose (manual entry) - POCT glycosylated hemoglobin (Hb A1C) - Comprehensive metabolic panel - CBC - Lipid panel - atorvastatin (LIPITOR) 10 MG tablet; Take 1 tablet (10 mg total) by mouth daily.  Dispense: 90 tablet; Refill: 3 - metFORMIN (GLUCOPHAGE) 500 MG tablet; Take 1 tablet (500 mg total) by mouth daily with breakfast.  Dispense: 30 tablet; Refill: 5  2. Essential hypertension - amLODipine (NORVASC) 10 MG tablet; Take 1 tablet (10 mg total) by mouth daily.  Dispense: 90 tablet; Refill: 1  3. Tobacco dependence Patient advised to quit smoking. Discussed health risks associated with smoking including lung and other types of cancers, chronic lung diseases and CV risks.. Pt ready to give trail of quitting.  Discussed methods to help quit including quitting cold [place], use of NRT, Chantix and Bupropion.  Patient wanting to try Chantix.  Went over possible side effects including mood swings and bad dreams. - varenicline (CHANTIX STARTING MONTH PAK) 0.5 MG X 11 & 1 MG X 42 tablet; One 0.5 mg tabl PO daily for 3 days, then one 0.5 mg tab BID for 4 days, then increase to one 1 mg tablet twice daily.  Dispense: 53 tablet; Refill: 0 - varenicline (CHANTIX CONTINUING MONTH PAK) 1 MG tablet; Take 1 tablet (1 mg total) by mouth 2 (two) times daily.  Dispense: 60 tablet; Refill: 1  4. Dysfunctional uterine bleeding Likely due to her being perimenopausal and also history of fibroids - POCT urine pregnancy - Ambulatory referral to Gynecology  5. Uterine leiomyoma, unspecified location - Ambulatory referral to Gynecology  6. Pre-ulcerative corn or callous - Ambulatory referral to  Podiatry  7. Chronic midline low back pain without sciatica We will hold off narcotics.  I have placed her on meloxicam instead - meloxicam (MOBIC) 15 MG tablet; Take 1 tablet (15 mg total) by mouth daily.  Dispense: 30 tablet; Refill: 0  8. Anemia - ferrous sulfate 325 (65 FE) MG tablet; Take 1 tablet (325 mg total) by mouth daily with breakfast.  Dispense: 90 tablet; Refill: 1  Patient was given the opportunity to ask questions.  Patient verbalized understanding of the plan and was able to repeat key elements of the plan.   Orders Placed This Encounter  Procedures  . Microalbumin / creatinine urine ratio  . Comprehensive metabolic panel  . CBC  . Lipid panel  . Ambulatory referral to Podiatry  . Ambulatory referral to Gynecology  . POCT glucose (manual entry)  . POCT glycosylated hemoglobin (Hb A1C)  . POCT urine pregnancy     Requested Prescriptions   Signed Prescriptions Disp Refills  . atorvastatin (LIPITOR) 10 MG tablet 90 tablet 3    Sig: Take 1 tablet (10 mg total) by mouth daily.  Marland Kitchen amLODipine (NORVASC) 10 MG tablet 90 tablet 1    Sig: Take 1 tablet (10 mg total) by mouth daily.  . ferrous sulfate 325 (65 FE) MG tablet 90 tablet 1    Sig: Take 1 tablet (325 mg total) by mouth daily with breakfast.  . metFORMIN (GLUCOPHAGE) 500 MG tablet 30 tablet 5    Sig: Take 1 tablet (500 mg total) by mouth daily with breakfast.  . varenicline (CHANTIX STARTING MONTH PAK) 0.5 MG X 11 & 1 MG X 42 tablet 53 tablet 0    Sig: One 0.5 mg tabl PO daily for 3 days, then one 0.5 mg tab BID for 4 days, then increase to one 1  mg tablet twice daily.  . varenicline (CHANTIX CONTINUING MONTH PAK) 1 MG tablet 60 tablet 1    Sig: Take 1 tablet (1 mg total) by mouth 2 (two) times daily.  . meloxicam (MOBIC) 15 MG tablet 30 tablet 0    Sig: Take 1 tablet (15 mg total) by mouth daily.    Return in about 1 month (around 09/21/2018) for PAP.  Karle Plumber, MD, FACP

## 2018-08-21 NOTE — Progress Notes (Signed)
cbg -179 a1c -6.0 Urine pregnancy- negative

## 2018-08-22 LAB — LIPID PANEL
CHOL/HDL RATIO: 2.9 ratio (ref 0.0–4.4)
CHOLESTEROL TOTAL: 182 mg/dL (ref 100–199)
HDL: 63 mg/dL (ref >39)
LDL CALC: 107 mg/dL — AB (ref 0–99)
Triglycerides: 59 mg/dL (ref 0–149)
VLDL Cholesterol Cal: 12 mg/dL (ref 5–40)

## 2018-08-22 LAB — COMPREHENSIVE METABOLIC PANEL
A/G RATIO: 1.5 (ref 1.2–2.2)
ALBUMIN: 4.2 g/dL (ref 3.5–5.5)
ALK PHOS: 65 IU/L (ref 39–117)
ALT: 17 IU/L (ref 0–32)
AST: 20 IU/L (ref 0–40)
BILIRUBIN TOTAL: 0.2 mg/dL (ref 0.0–1.2)
BUN / CREAT RATIO: 14 (ref 9–23)
BUN: 10 mg/dL (ref 6–24)
CO2: 23 mmol/L (ref 20–29)
Calcium: 9.4 mg/dL (ref 8.7–10.2)
Chloride: 102 mmol/L (ref 96–106)
Creatinine, Ser: 0.72 mg/dL (ref 0.57–1.00)
GFR calc Af Amer: 115 mL/min/{1.73_m2} (ref >59)
GFR calc non Af Amer: 99 mL/min/{1.73_m2} (ref >59)
GLOBULIN, TOTAL: 2.8 g/dL (ref 1.5–4.5)
GLUCOSE: 117 mg/dL — AB (ref 65–99)
POTASSIUM: 4 mmol/L (ref 3.5–5.2)
SODIUM: 141 mmol/L (ref 134–144)
Total Protein: 7 g/dL (ref 6.0–8.5)

## 2018-08-22 LAB — CBC
HEMATOCRIT: 38.4 % (ref 34.0–46.6)
Hemoglobin: 12.7 g/dL (ref 11.1–15.9)
MCH: 26.1 pg — AB (ref 26.6–33.0)
MCHC: 33.1 g/dL (ref 31.5–35.7)
MCV: 79 fL (ref 79–97)
Platelets: 270 10*3/uL (ref 150–450)
RBC: 4.87 x10E6/uL (ref 3.77–5.28)
RDW: 17 % — ABNORMAL HIGH (ref 12.3–15.4)
WBC: 4.8 10*3/uL (ref 3.4–10.8)

## 2018-08-22 LAB — MICROALBUMIN / CREATININE URINE RATIO
CREATININE, UR: 170 mg/dL
Microalb/Creat Ratio: 13.8 mg/g creat (ref 0.0–30.0)
Microalbumin, Urine: 23.5 ug/mL

## 2018-08-25 ENCOUNTER — Telehealth: Payer: Self-pay

## 2018-08-25 NOTE — Telephone Encounter (Signed)
Contacted pt to go over lab results pt is aware and doesn't have any questions or concerns 

## 2018-10-03 ENCOUNTER — Ambulatory Visit: Payer: Medicare Other | Admitting: Internal Medicine

## 2018-10-16 ENCOUNTER — Encounter: Payer: Self-pay | Admitting: Podiatry

## 2018-10-21 ENCOUNTER — Ambulatory Visit: Payer: Medicare Other | Admitting: Internal Medicine

## 2018-10-28 ENCOUNTER — Ambulatory Visit: Payer: Medicare Other | Admitting: Podiatry

## 2019-04-13 IMAGING — CT CT ABD-PELV W/ CM
2 of 5 series · 16 of 46 positions shown, 18 images · IV contrast (APPLIED)
Comparison: Ultrasound 03/25/2018

CLINICAL DATA: Upper and lower abdominal pain

EXAM:
CT ABDOMEN AND PELVIS WITH CONTRAST
TECHNIQUE: Multidetector CT imaging of the abdomen and pelvis was performed
using the standard protocol following bolus administration of
intravenous contrast.
CONTRAST:  100 mL Dsovue-ULL intravenous

[Series 3: abd/ pelvis 5.0 i30f 2 · axial · 0.81mm/px · z∈[+812,+1222]mm · 13 of 94 slices shown, 15 images]
[im 6/94  soft-tissue]
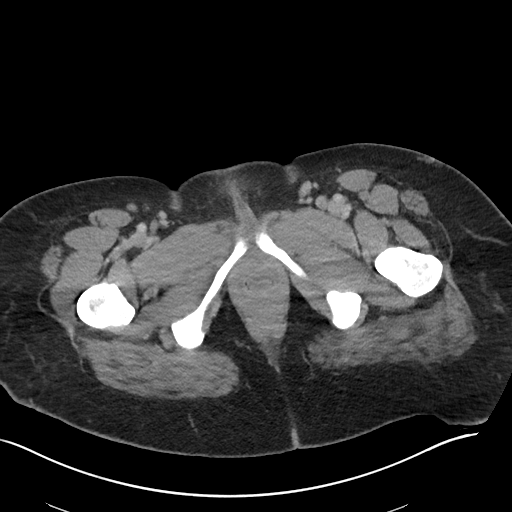
[im 6/94  bone]
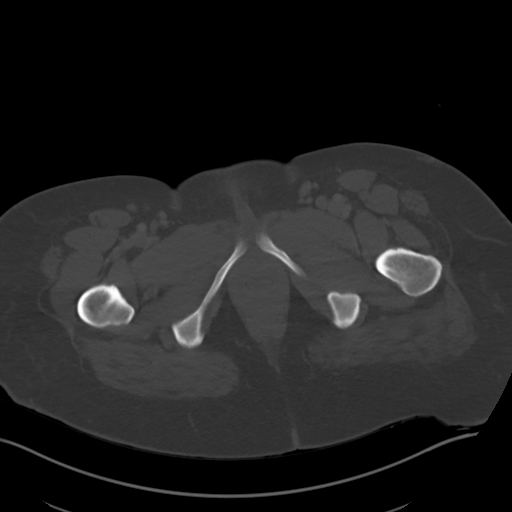
[im 11/94  soft-tissue]
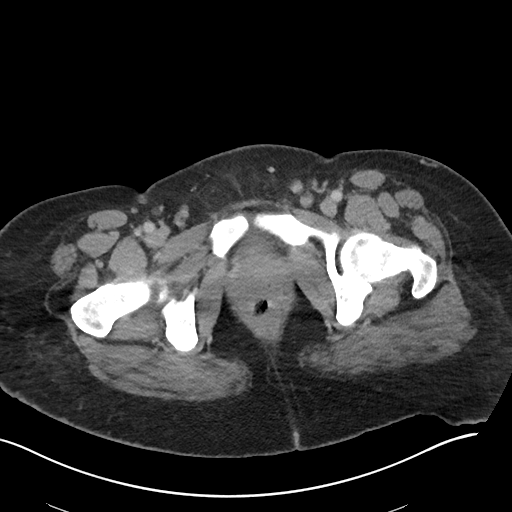
[im 22/94  soft-tissue]
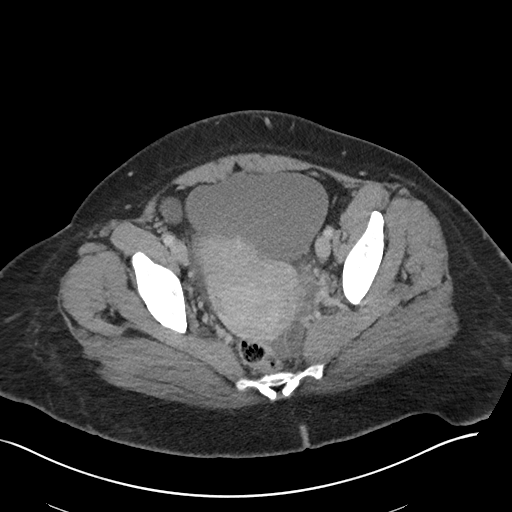
[im 28/94  soft-tissue]
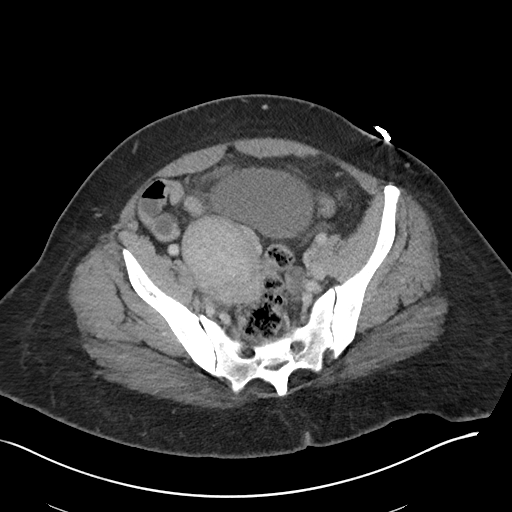
[im 33/94  soft-tissue]
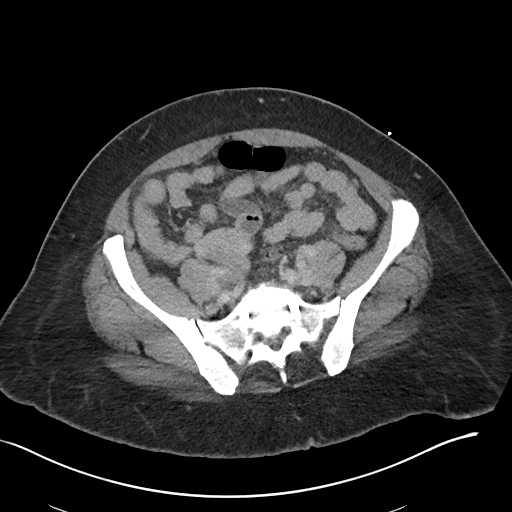
[im 39/94  soft-tissue]
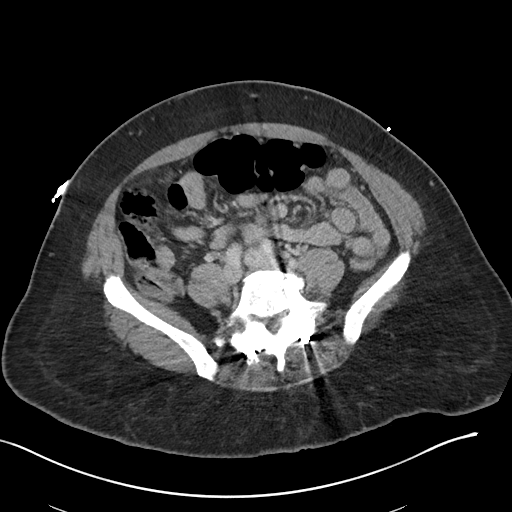
[im 50/94  soft-tissue]
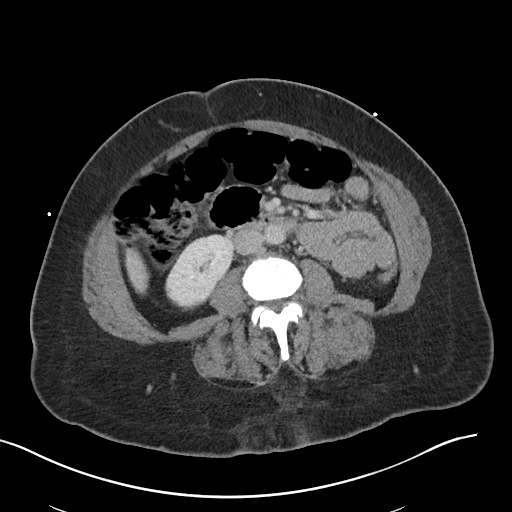
[im 55/94  soft-tissue]
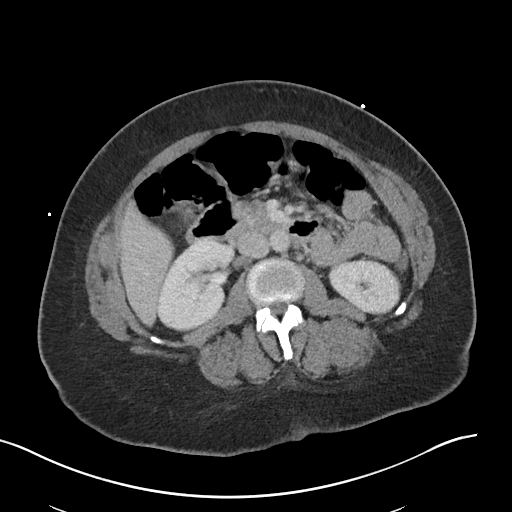
[im 61/94  soft-tissue]
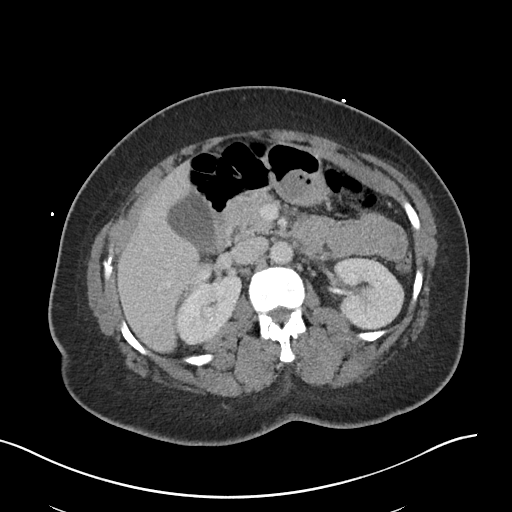
[im 61/94  bone]
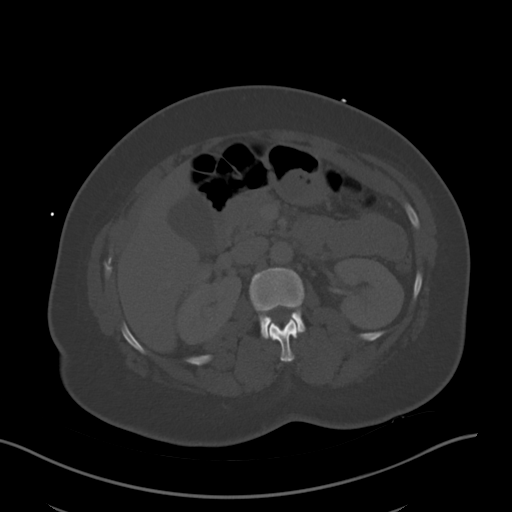
[im 66/94  soft-tissue]
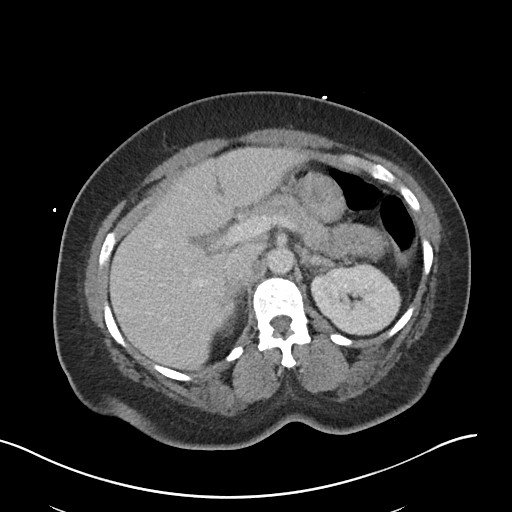
[im 72/94  soft-tissue]
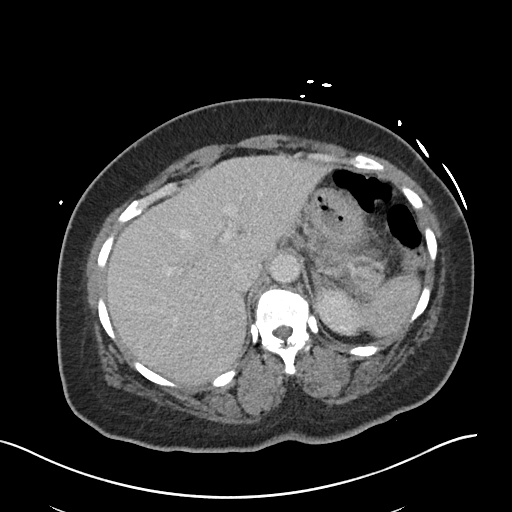
[im 83/94  soft-tissue]
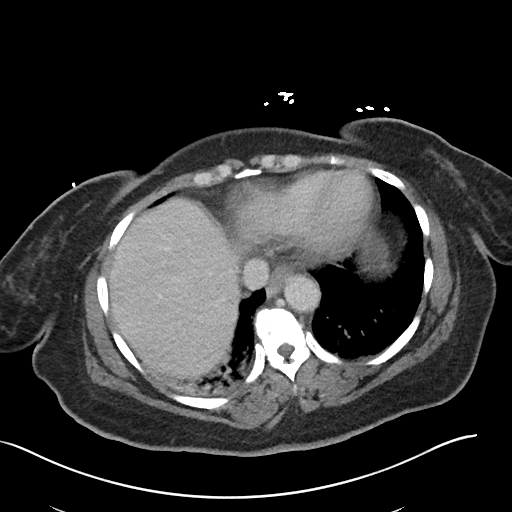
[im 88/94  soft-tissue]
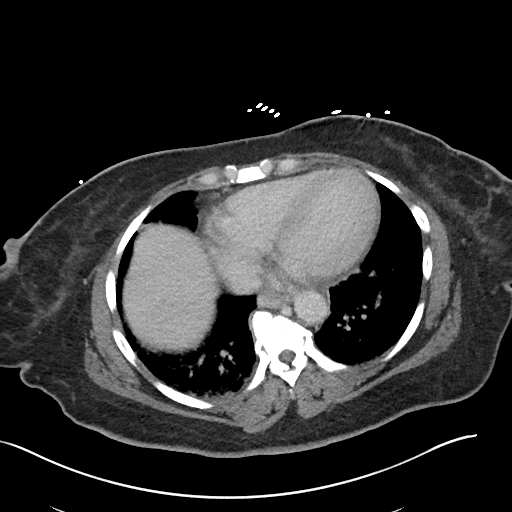

[Series 6: coronal soft tissue · coronal · 0.63mm/px · 3 of 97 slices shown]
[im 33/97  soft-tissue]
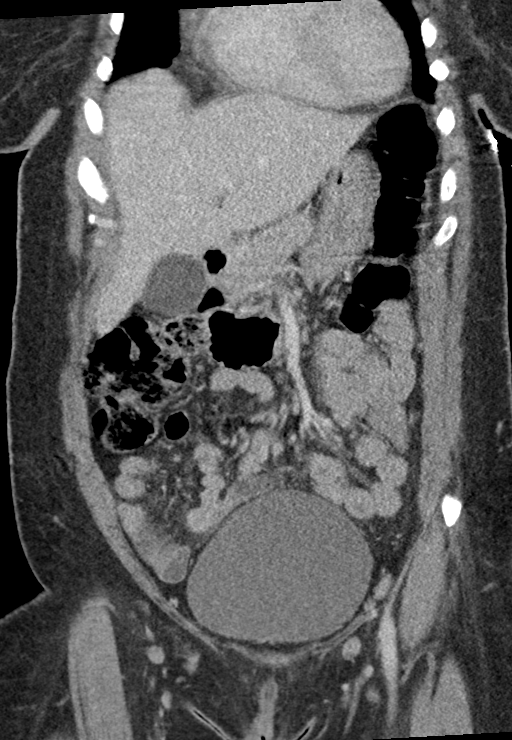
[im 43/97  soft-tissue]
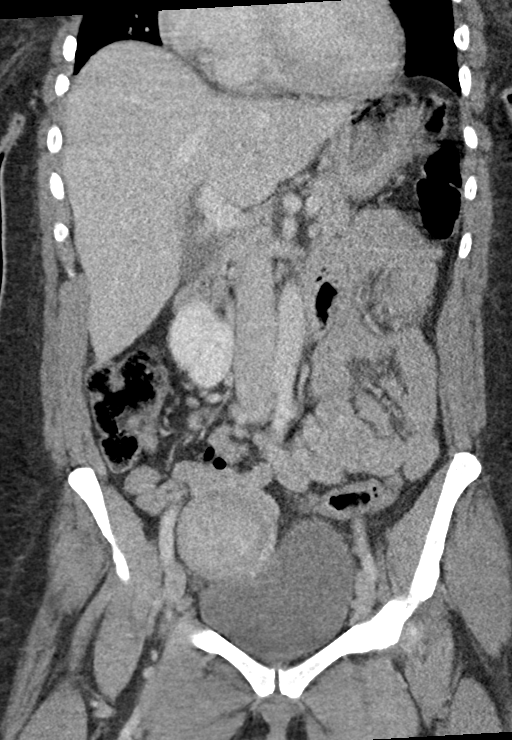
[im 54/97  soft-tissue]
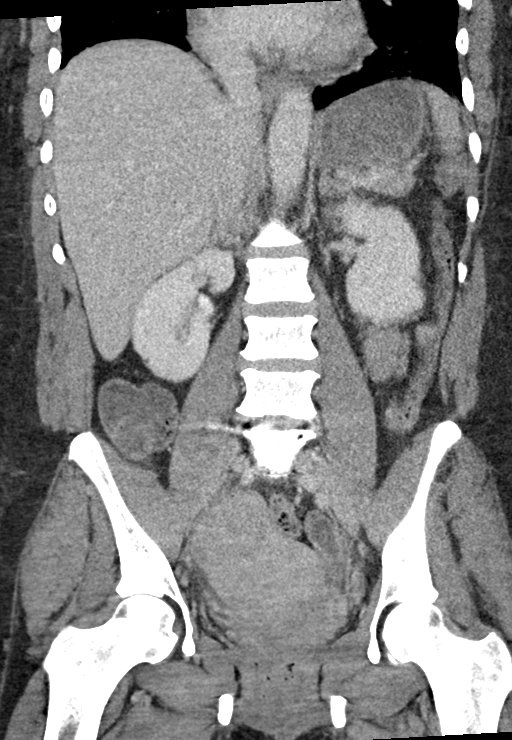

[16 of 46 positions shown; findings below may reference images not displayed]

FINDINGS: Lower chest: Lung bases demonstrate no acute consolidation or
effusion. Mild cardiomegaly.

Hepatobiliary: No focal liver abnormality is seen. No gallstones,
gallbladder wall thickening, or biliary dilatation.

Pancreas: Unremarkable. No pancreatic ductal dilatation or
surrounding inflammatory changes.

Spleen: Normal in size without focal abnormality.

Adrenals/Urinary Tract: Adrenal glands are within normal limits. No
hydronephrosis. Bladder unremarkable.

Stomach/Bowel: Stomach nonenlarged. No dilated small bowel. Normal
appendix. No colon wall thickening.

Vascular/Lymphatic: Nonaneurysmal aorta. Subcentimeter
retroperitoneal lymph nodes.

Reproductive: Lobulated uterus with multiple masses. Dominant right
pelvic mass measuring 7.7 cm, likely exophytic fibroid. Tubular
fluid density structure paralleling the left uterus, suspect for
hydrosalpinx. Probable 3.3 cm left ovarian cyst.

Other: Negative for free air.  Trace free fluid in the pelvis.

Musculoskeletal: Posterior stabilization rods and fixating screws at
L5-S1 with interbody device.
IMPRESSION: 1. No CT evidence for acute intra-abdominal or pelvic abnormality.
2. Fibroid uterus. Probable left hydrosalpinx. Probable 3.3 cm left
ovarian cyst, recommend 6-12 week sonographic follow-up.
3. Mild cardiomegaly

## 2019-04-13 IMAGING — CR DG CHEST 2V
2 series · 2 of 2 positions shown · non-contrast
Comparison: None

CLINICAL DATA: Right-sided chest pain for 1 day.

EXAM:
CHEST - 2 VIEW

[chest lat]
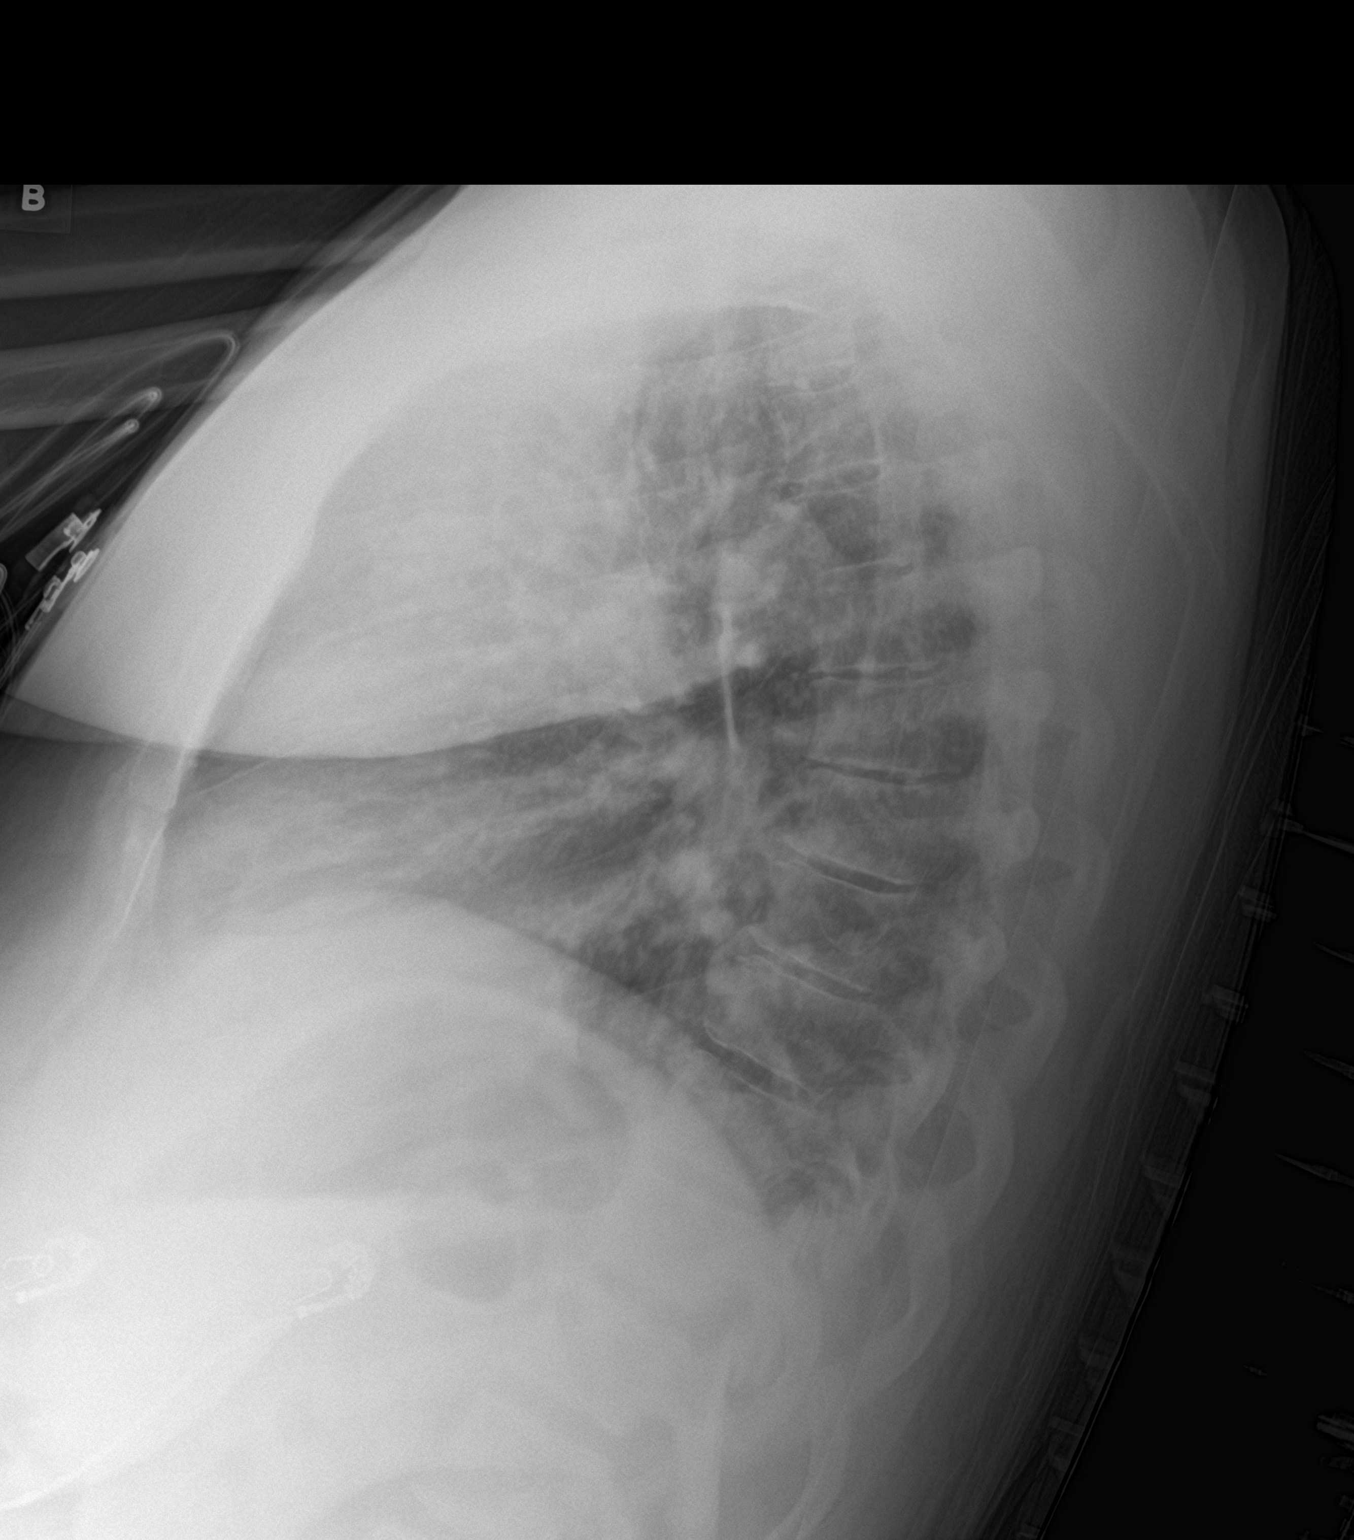

[chest ap]
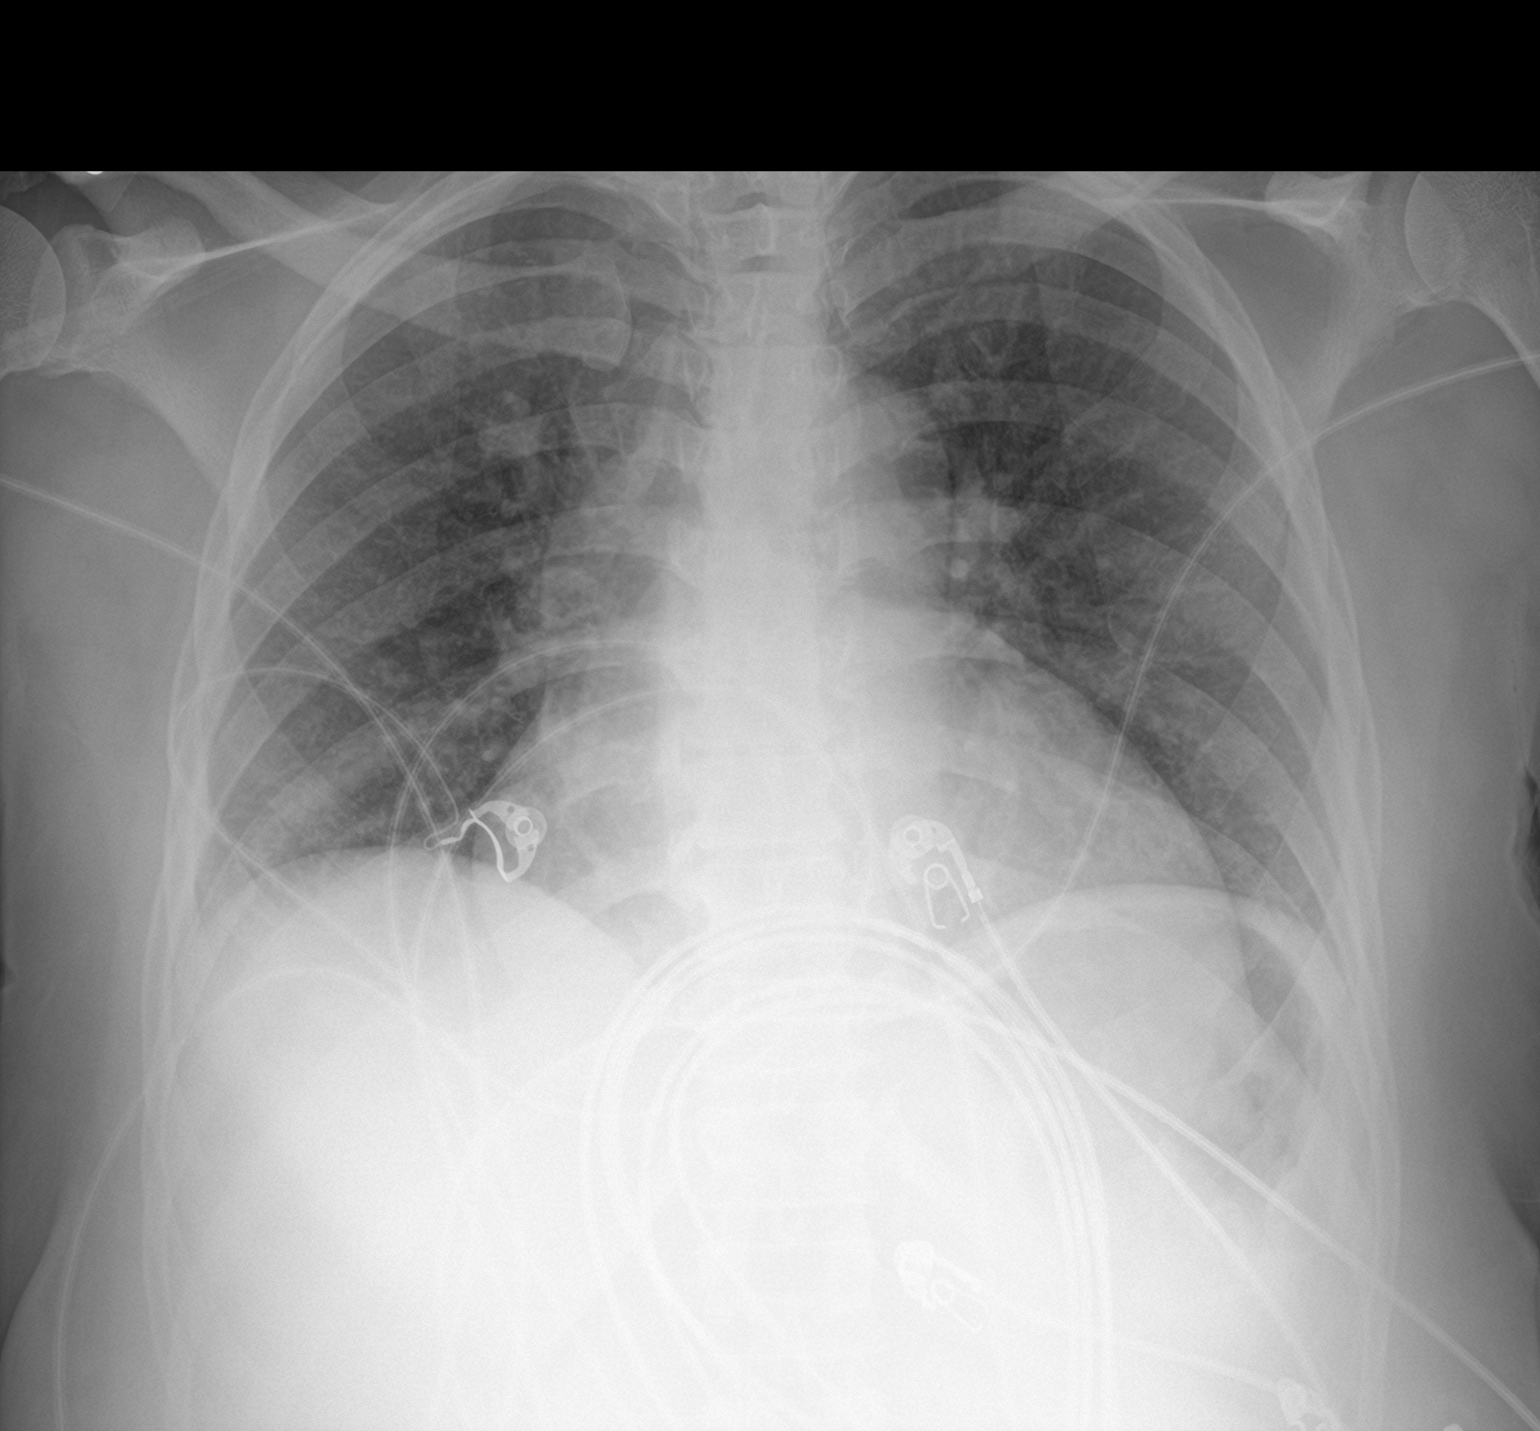

[2 of 2 positions shown; findings below may reference images not displayed]

FINDINGS: Mild cardiac enlargement. Pulmonary vascular congestion noted. No
pleural effusion or pulmonary edema. No airspace opacities.
Spondylosis noted within the thoracic spine.
IMPRESSION: 1. Cardiac enlargement and pulmonary vascular congestion.

## 2019-06-29 ENCOUNTER — Ambulatory Visit: Payer: Medicare Other | Admitting: Internal Medicine

## 2019-07-14 ENCOUNTER — Ambulatory Visit: Payer: Medicare Other | Admitting: Internal Medicine

## 2021-03-21 ENCOUNTER — Telehealth: Payer: Self-pay | Admitting: Internal Medicine

## 2021-03-21 NOTE — Telephone Encounter (Signed)
Returned pt call and made aware that her appt with provider on 4/18 will not be a medicare wellness it will be a regular office cause she hasn't been seen in 2 years. Made pt aware that after her visit with provider we will have her come back to do her medicare wellness. Pt is agreeable pt doesn't have any questions or concerns

## 2021-03-21 NOTE — Telephone Encounter (Signed)
Copied from [place] [phone]. Topic: General - Inquiry >> Mar 20, 2021 12:29 PM Greggory Keen D wrote: Reason for CRM: Pt called saying she use to be on metformin and hasn't taken it in over a year.  She thinks now her sugar may be getting high again.  She has an appt in April for a PE but wants to know if she should come in sooner.  CB#  [phone]

## 2021-04-17 ENCOUNTER — Other Ambulatory Visit: Payer: Self-pay

## 2021-04-17 ENCOUNTER — Ambulatory Visit: Payer: Medicare HMO | Attending: Internal Medicine | Admitting: Internal Medicine

## 2021-04-17 ENCOUNTER — Ambulatory Visit: Payer: Medicare Other | Admitting: Internal Medicine

## 2021-04-17 ENCOUNTER — Encounter: Payer: Self-pay | Admitting: Internal Medicine

## 2021-04-17 VITALS — BP 178/92 | HR 73 | Resp 16 | Ht 67.5 in | Wt 255.8 lb

## 2021-04-17 DIAGNOSIS — E1159 Type 2 diabetes mellitus with other circulatory complications: Secondary | ICD-10-CM | POA: Diagnosis not present

## 2021-04-17 DIAGNOSIS — E669 Obesity, unspecified: Secondary | ICD-10-CM

## 2021-04-17 DIAGNOSIS — F17200 Nicotine dependence, unspecified, uncomplicated: Secondary | ICD-10-CM | POA: Diagnosis not present

## 2021-04-17 DIAGNOSIS — E1169 Type 2 diabetes mellitus with other specified complication: Secondary | ICD-10-CM

## 2021-04-17 DIAGNOSIS — I152 Hypertension secondary to endocrine disorders: Secondary | ICD-10-CM

## 2021-04-17 DIAGNOSIS — Z789 Other specified health status: Secondary | ICD-10-CM | POA: Diagnosis not present

## 2021-04-17 DIAGNOSIS — M79672 Pain in left foot: Secondary | ICD-10-CM | POA: Diagnosis not present

## 2021-04-17 DIAGNOSIS — N950 Postmenopausal bleeding: Secondary | ICD-10-CM

## 2021-04-17 DIAGNOSIS — Z1211 Encounter for screening for malignant neoplasm of colon: Secondary | ICD-10-CM | POA: Diagnosis not present

## 2021-04-17 DIAGNOSIS — Z1231 Encounter for screening mammogram for malignant neoplasm of breast: Secondary | ICD-10-CM

## 2021-04-17 DIAGNOSIS — E785 Hyperlipidemia, unspecified: Secondary | ICD-10-CM

## 2021-04-17 MED ORDER — AMLODIPINE BESYLATE 10 MG PO TABS
10.0000 mg | ORAL_TABLET | Freq: Every day | ORAL | 1 refills | Status: DC
  Filled 2021-04-17: qty 90, 90d supply, fill #0

## 2021-04-17 MED ORDER — METFORMIN HCL 500 MG PO TABS
500.0000 mg | ORAL_TABLET | Freq: Every day | ORAL | 5 refills | Status: DC
  Filled 2021-04-17: qty 30, 30d supply, fill #0

## 2021-04-17 NOTE — Progress Notes (Signed)
cbg-207 a1c-6.6

## 2021-04-17 NOTE — Progress Notes (Signed)
Patient ID: Karen Tanner, female    DOB: [birth year]  MRN: [id number]  CC: New Patient (Initial Visit), Diabetes, and Hypertension   Subjective: Karen Tanner is a 50 y.o. female who presents for re-est care Her concerns today include:  Patient with history of HTN, diabetes, fibroid, HL, anemia, tob dep, chronic LBP had fusion surgery 2004  Last seen 07/2018.  Patient states she was not seeing another primary in the interim.  She did not come in for chronic disease management because of the Covid pandemic.   DM:  A1C 6.6/BS 207 No device to check BS When I last saw her, she was on Metformin.  She has been out of the medication for over 2 years.  Feels she can do better with eating habits.  Not getting in much exercise outside of work. -History of hyperlipidemia.  She was on Lipitor but has been out of it for about 2 years.  HTN:  Out Norvasc x 2 yrs Limits salt in foods No CP/SOB/LE edema/HA  Tob dep: Smokes 1/2 pk of cigarettes a day.  Smoked for about 30 yrs. Quit for 9 yrs one time.  Feels she needs to quit or slow down.  Not quite ready to give a trial of quitting completely.   "I drink a lot too." Drinks a 1/5 liq on weekends  Complains of pain in LT heel since last yr More so when walking.  Her jobs require a lot of walking.  She cleans office spaces for living.   Wears tennis shoes at work Using Naprosyn 220 mg 2-3 at a time, not every day.    Anemia: History of anemia due to fibroids.  No menses in over 1 yr.  Then had cycle 2 wks ago that lasted a wk  HM:  Due for PAP, colon CA screen, diabetic eye exam, mammogram  Patient Active Problem List   Diagnosis Date Noted  . Uterine leiomyoma 08/21/2018  . Diabetes type 2, controlled ([place]) 05/21/2016  . History of back surgery 10/26/2014  . History of anemia 10/26/2014  . Essential hypertension 10/26/2014  . Chronic low back pain 10/26/2014  . Menorrhagia with regular cycle 10/26/2014     Current Outpatient  Medications on File Prior to Visit  Medication Sig Dispense Refill  . Blood Glucose Monitoring Suppl (TRUE METRIX METER) w/Device KIT Use as directed (Patient not taking: Reported on 04/17/2021) 1 kit 0  . glucose blood (TRUE METRIX BLOOD GLUCOSE TEST) test strip Use as instructed (Patient not taking: Reported on 04/17/2021) 100 each 12  . TRUEPLUS LANCETS 28G MISC Check sugar once a day (Patient not taking: Reported on 04/17/2021) 100 each 12   No current facility-administered medications on file prior to visit.    No Known Allergies  Social History   Socioeconomic History  . Marital status: Single    Spouse name: Not on file  . Number of children: Not on file  . Years of education: Not on file  . Highest education level: Not on file  Occupational History  . Not on file  Tobacco Use  . Smoking status: Former Smoker    Packs/day: 0.25    Years: 14.00    Pack years: 3.50  . Smokeless tobacco: Never Used  . Tobacco comment: Smoking 6-7 cigs per day  Substance and Sexual Activity  . Alcohol use: Yes    Alcohol/week: 0.0 standard drinks  . Drug use: Not on file  . Sexual activity: Not on file  Other Topics Concern  . Not on file  Social History Narrative  . Not on file   Social Determinants of Health   Financial Resource Strain: Not on file  Food Insecurity: Not on file  Transportation Needs: Not on file  Physical Activity: Not on file  Stress: Not on file  Social Connections: Not on file  Intimate Partner Violence: Not on file    Family History  Problem Relation Age of Onset  . Diabetes Mother   . Hyperlipidemia Mother   . Heart disease Mother   . Diabetes Father   . Diabetes Maternal Grandmother     Past Surgical History:  Procedure Laterality Date  . BACK SURGERY     Bone fusion    ROS: Review of Systems Negative except as stated above  PHYSICAL EXAM: BP (!) 178/92   Pulse 73   Resp 16   Ht 5' 7.5" (1.715 m)   Wt 255 lb 12.8 oz (116 kg)   SpO2 99%    BMI 39.47 kg/m   Physical Exam  General appearance - alert, well appearing, late African-American female and in no distress Mental status - normal mood, behavior, speech, dress, motor activity, and thought processes Neck - supple, no significant adenopathy Chest - clear to auscultation, no wheezes, rales or rhonchi, symmetric air entry Heart - normal rate, regular rhythm, normal S1, S2, no murmurs, rubs, clicks or gallops Extremities - peripheral pulses normal, no pedal edema, no clubbing or cyanosis Diabetic Foot Exam - Simple   Simple Foot Form Visual Inspection No deformities, no ulcerations, no other skin breakdown bilaterally: Yes Sensation Testing Intact to touch and monofilament testing bilaterally: Yes Pulse Check Posterior Tibialis and Dorsalis pulse intact bilaterally: Yes Comments Mild tenderness on palpation of plantar heel on the left foot.      CMP Latest Ref Rng & Units 08/21/2018 03/25/2018 06/13/2016  Glucose 65 - 99 mg/dL 117(H) 196(H) 124(H)  BUN 6 - 24 mg/dL $Remove'10 6 9  'BciHRSe$ Creatinine 0.57 - 1.00 mg/dL 0.72 0.70 0.59  Sodium 134 - 144 mmol/L 141 137 137  Potassium 3.5 - 5.2 mmol/L 4.0 3.4(L) 4.3  Chloride 96 - 106 mmol/L 102 104 102  CO2 20 - 29 mmol/L $RemoveB'23 25 25  'gFnaWlVC$ Calcium 8.7 - 10.2 mg/dL 9.4 8.9 9.0  Total Protein 6.0 - 8.5 g/dL 7.0 7.0 6.9  Total Bilirubin 0.0 - 1.2 mg/dL 0.2 0.4 0.5  Alkaline Phos 39 - 117 IU/L 65 60 55  AST 0 - 40 IU/L $Remov'20 26 19  'JiOXGw$ ALT 0 - 32 IU/L $Remov'17 18 16   'nNAEFA$ Lipid Panel     Component Value Date/Time   CHOL 182 08/21/2018 1507   TRIG 59 08/21/2018 1507   HDL 63 08/21/2018 1507   CHOLHDL 2.9 08/21/2018 1507   CHOLHDL 3.2 01/23/2016 0956   VLDL 9 01/23/2016 0956   LDLCALC 107 (H) 08/21/2018 1507    CBC    Component Value Date/Time   WBC 4.8 08/21/2018 1507   WBC 16.4 (H) 03/25/2018 1520   RBC 4.87 08/21/2018 1507   RBC 4.68 03/25/2018 1520   HGB 12.7 08/21/2018 1507   HCT 38.4 08/21/2018 1507   PLT 270 08/21/2018 1507   MCV 79  08/21/2018 1507   MCH 26.1 (L) 08/21/2018 1507   MCH 22.2 (L) 03/25/2018 1520   MCHC 33.1 08/21/2018 1507   MCHC 31.0 03/25/2018 1520   RDW 17.0 (H) 08/21/2018 1507   LYMPHSABS 1.0 03/25/2018 1520   MONOABS 0.7 03/25/2018  1520   EOSABS 0.1 03/25/2018 1520   BASOSABS 0.0 03/25/2018 1520    ASSESSMENT AND PLAN: 1. Type 2 diabetes mellitus with obesity ([facility]) Surprisingly her A1c is below 7. Discussed and encourage healthy eating habits.  Encouraged her to stay active.  Restart low-dose Metformin - POCT glucose (manual entry) - POCT glycosylated hemoglobin (Hb A1C) - Microalbumin / creatinine urine ratio - CBC - Comprehensive metabolic panel - Lipid panel - Ambulatory referral to Ophthalmology - metFORMIN (GLUCOPHAGE) 500 MG tablet; Take 1 tablet (500 mg total) by mouth daily with breakfast.  Dispense: 30 tablet; Refill: 5  2. Hypertension associated with diabetes ([place]) Uncontrolled.  Restart amlodipine.  DASH diet discussed and encouraged. Recheck on follow-up visit when she comes for Pap. - amLODipine (NORVASC) 10 MG tablet; Take 1 tablet (10 mg total) by mouth daily.  Dispense: 90 tablet; Refill: 1  3. Pain of left heel Differential diagnosis include plantar fasciitis versus bone spur. Recommend plain x-ray. She is already wearing comfortable tennis shoes with good insoles.  Referred to podiatry.  Using Tylenol instead of NSAIDs for pain. - Ambulatory referral to Podiatry - DG Foot Complete Left; Future  4. Postmenopausal bleeding History suggest this is postmenopausal bleeding.  Will refer to gynecology for further work-up.  5. Tobacco dependence Advised to quit.  Discussed health risks associated with smoking.  Patient not ready to give a trial of quitting.  6. Drinking binge Discussed how much drinking is too much in females.  History suggest that she is binge drinking on the weekends.  Encouraged her to cut back to no more than 1 shot on any particular weekend  night.  7. Hyperlipidemia associated with type 2 diabetes mellitus ([place]) We will check liver profile on blood test today.  If it is okay we will restart the atorvastatin.  8. Screening for colon cancer Discussed colon cancer screening recommendations.  She is at average risk for colon cancer.  Discussed methods for screening.  Patient prefers to have colonoscopy. - Ambulatory referral to Gastroenterology  9. Encounter for screening mammogram for malignant neoplasm of breast - MM Digital Screening; Future   Patient was given the opportunity to ask questions.  Patient verbalized understanding of the plan and was able to repeat key elements of the plan.   Orders Placed This Encounter  Procedures  . MM Digital Screening  . DG Foot Complete Left  . Microalbumin / creatinine urine ratio  . CBC  . Comprehensive metabolic panel  . Lipid panel  . Ambulatory referral to Ophthalmology  . Ambulatory referral to Podiatry  . Ambulatory referral to Gastroenterology  . POCT glucose (manual entry)  . POCT glycosylated hemoglobin (Hb A1C)     Requested Prescriptions   Signed Prescriptions Disp Refills  . amLODipine (NORVASC) 10 MG tablet 90 tablet 1    Sig: Take 1 tablet (10 mg total) by mouth daily.  . metFORMIN (GLUCOPHAGE) 500 MG tablet 30 tablet 5    Sig: Take 1 tablet (500 mg total) by mouth daily with breakfast.    Return in about 6 weeks (around 05/29/2021) for PAP.  Karle Plumber, MD, FACP

## 2021-04-18 ENCOUNTER — Other Ambulatory Visit: Payer: Self-pay

## 2021-04-18 LAB — COMPREHENSIVE METABOLIC PANEL
ALT: 19 IU/L (ref 0–32)
AST: 20 IU/L (ref 0–40)
Albumin/Globulin Ratio: 1.4 (ref 1.2–2.2)
Albumin: 4.3 g/dL (ref 3.8–4.9)
Alkaline Phosphatase: 94 IU/L (ref 44–121)
BUN/Creatinine Ratio: 16 (ref 9–23)
BUN: 15 mg/dL (ref 6–24)
Bilirubin Total: 0.4 mg/dL (ref 0.0–1.2)
CO2: 24 mmol/L (ref 20–29)
Calcium: 10 mg/dL (ref 8.7–10.2)
Chloride: 102 mmol/L (ref 96–106)
Creatinine, Ser: 0.92 mg/dL (ref 0.57–1.00)
Globulin, Total: 3.1 g/dL (ref 1.5–4.5)
Glucose: 108 mg/dL — ABNORMAL HIGH (ref 65–99)
Potassium: 4.4 mmol/L (ref 3.5–5.2)
Sodium: 143 mmol/L (ref 134–144)
Total Protein: 7.4 g/dL (ref 6.0–8.5)
eGFR: 75 mL/min/{1.73_m2} (ref >59)

## 2021-04-18 LAB — CBC
Hematocrit: 43.6 % (ref 34.0–46.6)
Hemoglobin: 14.1 g/dL (ref 11.1–15.9)
MCH: 27.8 pg (ref 26.6–33.0)
MCHC: 32.3 g/dL (ref 31.5–35.7)
MCV: 86 fL (ref 79–97)
Platelets: 292 10*3/uL (ref 150–450)
RBC: 5.07 x10E6/uL (ref 3.77–5.28)
RDW: 13.7 % (ref 11.7–15.4)
WBC: 6.1 10*3/uL (ref 3.4–10.8)

## 2021-04-18 LAB — LIPID PANEL
Chol/HDL Ratio: 2.9 ratio (ref 0.0–4.4)
Cholesterol, Total: 212 mg/dL — ABNORMAL HIGH (ref 100–199)
HDL: 73 mg/dL (ref >39)
LDL Chol Calc (NIH): 121 mg/dL — ABNORMAL HIGH (ref 0–99)
Triglycerides: 101 mg/dL (ref 0–149)
VLDL Cholesterol Cal: 18 mg/dL (ref 5–40)

## 2021-04-18 LAB — POCT GLYCOSYLATED HEMOGLOBIN (HGB A1C): HbA1c, POC (controlled diabetic range): 6.6 % (ref 0.0–7.0)

## 2021-04-18 LAB — GLUCOSE, POCT (MANUAL RESULT ENTRY): POC Glucose: 207 mg/dl — AB (ref 70–99)

## 2021-04-19 ENCOUNTER — Other Ambulatory Visit: Payer: Self-pay | Admitting: Internal Medicine

## 2021-04-19 ENCOUNTER — Other Ambulatory Visit: Payer: Self-pay

## 2021-04-19 MED ORDER — ATORVASTATIN CALCIUM 10 MG PO TABS
10.0000 mg | ORAL_TABLET | Freq: Every day | ORAL | 1 refills | Status: DC
  Filled 2021-04-19: qty 90, 90d supply, fill #0

## 2021-04-19 NOTE — Progress Notes (Signed)
Let patient know that her blood cell counts are normal.  Kidney and liver function tests are okay.  Cholesterol level is elevated.  LDL cholesterol is 121 with goal being less than 70.  High cholesterol increases the risk for heart attack and strokes.  I recommend restarting cholesterol medication called atorvastatin.  Prescription sent to her pharmacy.  Healthy eating habits and regular exercise will also help to lower cholesterol.

## 2021-04-21 ENCOUNTER — Telehealth: Payer: Self-pay

## 2021-04-21 NOTE — Telephone Encounter (Signed)
Contacted pt to go over lab results pt is aware and doesn't have any questions or concerns 

## 2021-04-25 ENCOUNTER — Ambulatory Visit (HOSPITAL_COMMUNITY)
Admission: EM | Admit: 2021-04-25 | Discharge: 2021-04-25 | Disposition: A | Discharge status: Home / Self Care | Payer: Medicare HMO | Attending: Student | Admitting: Student

## 2021-04-25 ENCOUNTER — Encounter (HOSPITAL_COMMUNITY): Payer: Self-pay | Admitting: Emergency Medicine

## 2021-04-25 ENCOUNTER — Other Ambulatory Visit: Payer: Self-pay

## 2021-04-25 DIAGNOSIS — K047 Periapical abscess without sinus: Secondary | ICD-10-CM

## 2021-04-25 DIAGNOSIS — K0889 Other specified disorders of teeth and supporting structures: Secondary | ICD-10-CM

## 2021-04-25 DIAGNOSIS — I10 Essential (primary) hypertension: Secondary | ICD-10-CM

## 2021-04-25 DIAGNOSIS — E119 Type 2 diabetes mellitus without complications: Secondary | ICD-10-CM | POA: Diagnosis not present

## 2021-04-25 MED ORDER — AMOXICILLIN-POT CLAVULANATE 875-125 MG PO TABS: 1.0000 | ORAL_TABLET | Freq: Two times a day (BID) | ORAL | 0 refills | Status: DC

## 2021-04-25 MED ORDER — METRONIDAZOLE 500 MG PO TABS: 500.0000 mg | ORAL_TABLET | Freq: Two times a day (BID) | ORAL | 0 refills | Status: DC

## 2021-04-25 MED ORDER — LIDOCAINE VISCOUS HCL 2 % MT SOLN: 15.0000 mL | OROMUCOSAL | 0 refills | Status: DC | PRN

## 2021-04-25 MED ORDER — IBUPROFEN 800 MG PO TABS: 800.0000 mg | ORAL_TABLET | Freq: Three times a day (TID) | ORAL | 0 refills | Status: DC

## 2021-04-25 NOTE — ED Provider Notes (Signed)
[place]    CSN: [id number] Arrival date & time: 04/25/21  1530      History   Chief Complaint Chief Complaint  Patient presents with  . Dental Pain    HPI Karen Tanner is a 50 y.o. female presenting with dental abscess.  Medical history diabetes, hypertension, drinking binge 1 week ago.  Presenting with 1 day of left-sided dental pain with some facial swelling.  Fevers and chills.  Adamantly denies any pain of the floor or roof of her mouth, foul taste in mouth, throat pain, trouble swallowing, shortness of breath, dizziness, chest pain.  HPI  Past Medical History:  Diagnosis Date  . Anemia   . Diabetes mellitus without complication ([place])   . Hypertension     Patient Active Problem List   Diagnosis Date Noted  . Drinking binge 04/17/2021  . Tobacco dependence 04/17/2021  . Hyperlipidemia associated with type 2 diabetes mellitus ([place]) 04/17/2021  . Uterine leiomyoma 08/21/2018  . Type 2 diabetes mellitus with obesity ([place]) 05/21/2016  . History of back surgery 10/26/2014  . History of anemia 10/26/2014  . Hypertension associated with diabetes ([place]) 10/26/2014  . Chronic low back pain 10/26/2014  . Menorrhagia with regular cycle 10/26/2014    Past Surgical History:  Procedure Laterality Date  . BACK SURGERY     Bone fusion    OB History   No obstetric history on file.      Home Medications    Prior to Admission medications   Medication Sig Start Date End Date Taking? Authorizing Provider  amoxicillin-clavulanate (AUGMENTIN) 875-125 MG tablet Take 1 tablet by mouth every 12 (twelve) hours. 04/25/21  Yes Hazel Sams, PA-C  atorvastatin (LIPITOR) 10 MG tablet Take 1 tablet (10 mg total) by mouth daily. 04/19/21   Ladell Pier, MD  ibuprofen (ADVIL) 800 MG tablet Take 1 tablet (800 mg total) by mouth 3 (three) times daily. 04/25/21  Yes Hazel Sams, PA-C  lidocaine (XYLOCAINE) 2 % solution Use as directed 15 mLs in the mouth or  throat as needed for mouth pain. 04/25/21  Yes Hazel Sams, PA-C  metroNIDAZOLE (FLAGYL) 500 MG tablet Take 1 tablet (500 mg total) by mouth 2 (two) times daily. 04/25/21  Yes Hazel Sams, PA-C  amLODipine (NORVASC) 10 MG tablet Take 1 tablet (10 mg total) by mouth daily. 04/17/21   Ladell Pier, MD  metFORMIN (GLUCOPHAGE) 500 MG tablet Take 1 tablet (500 mg total) by mouth daily with breakfast. 04/17/21   Ladell Pier, MD    Family History Family History  Problem Relation Age of Onset  . Diabetes Mother   . Hyperlipidemia Mother   . Heart disease Mother   . Diabetes Father   . Diabetes Maternal Grandmother     Social History Social History   Tobacco Use  . Smoking status: Former Smoker    Packs/day: 0.25    Years: 14.00    Pack years: 3.50  . Smokeless tobacco: Never Used  . Tobacco comment: Smoking 6-7 cigs per day  Substance Use Topics  . Alcohol use: Yes    Alcohol/week: 0.0 standard drinks     Allergies   Patient has no known allergies.   Review of Systems Review of Systems  HENT: Positive for dental problem.   All other systems reviewed and are negative.    Physical Exam Triage Vital Signs ED Triage Vitals  Enc Vitals Group     BP  Pulse      Resp      Temp      Temp src      SpO2      Weight      Height      Head Circumference      Peak Flow      Pain Score      Pain Loc      Pain Edu?      Excl. in [place]?    No data found.  Updated Vital Signs BP (!) 159/80 (BP Location: Right Arm)   Pulse 81   Temp (!) 101 F (38.3 C) (Oral)   Resp 18   LMP  (LMP Unknown)   SpO2 98%   Visual Acuity Right Eye Distance:   Left Eye Distance:   Bilateral Distance:    Right Eye Near:   Left Eye Near:    Bilateral Near:     Physical Exam Vitals reviewed.  Constitutional:      General: She is not in acute distress.    Appearance: Normal appearance. She is not ill-appearing, toxic-appearing or diaphoretic.  HENT:     Head:  Normocephalic and atraumatic.     Jaw: There is normal jaw occlusion. No trismus, tenderness, swelling, pain on movement or malocclusion.     Salivary Glands: Right salivary gland is not diffusely enlarged or tender. Left salivary gland is not diffusely enlarged or tender.     Right Ear: Hearing normal.     Left Ear: Hearing normal.     Nose: Nose normal.     Mouth/Throat:     Lips: Pink.     Mouth: Mucous membranes are moist. No lacerations or oral lesions.     Dentition: Abnormal dentition. Does not have dentures. Dental tenderness, dental caries and dental abscesses present.     Tongue: No lesions. Tongue does not deviate from midline.     Palate: No mass.     Pharynx: Oropharynx is clear. Uvula midline. No oropharyngeal exudate or posterior oropharyngeal erythema.     Tonsils: No tonsillar exudate or tonsillar abscesses.     Comments: Poor dentician  Missing many teeth throughout mouth.  Left upper molars are missing, and instead there is significant amount of swelling and erythema.  No obvious drainage.  Left cheek is also swollen.  On exam she is tolerating her secretions without difficulty, there is no trismus, no drooling, she has normal phonation. No tenderness of floor or roof of mouth. Eyes:     Extraocular Movements: Extraocular movements intact.     Pupils: Pupils are equal, round, and reactive to light.  Cardiovascular:     Rate and Rhythm: Normal rate and regular rhythm.     Heart sounds: Normal heart sounds.  Pulmonary:     Effort: Pulmonary effort is normal.     Breath sounds: Normal breath sounds.  Skin:    General: Skin is warm.     Capillary Refill: Capillary refill takes less than 2 seconds.  Neurological:     General: No focal deficit present.     Mental Status: She is alert and oriented to person, place, and time.  Psychiatric:        Mood and Affect: Mood normal.        Behavior: Behavior normal.        Thought Content: Thought content normal.         Judgment: Judgment normal.      UC Treatments / Results  Labs (all labs ordered are listed, but only abnormal results are displayed) Labs Reviewed - No data to display  EKG   Radiology No results found.  Procedures Procedures (including critical care time)  Medications Ordered in UC Medications - No data to display  Initial Impression / Assessment and Plan / UC Course  I have reviewed the triage vital signs and the nursing notes.  Pertinent labs & imaging results that were available during my care of the patient were reviewed by me and considered in my medical decision making (see chart for details).     This patient is a 50 year old female presenting with dental abscess.  She is febrile but nontachycardic, nontachypneic, oxygenating well on room air.  Last dose of Tylenol was about 8 hours ago. On exam she is tolerating her secretions without difficulty, there is no trismus, no drooling, she has normal phonation, there is no tenderness of floor roof of mouth; low concern for Ludwig's angina.  Given extent of abscess, will treat with Augmentin and Flagyl as below.  Viscous lidocaine, tylenol and ibuprofen for pain relief.  She denies history of gastritis or kidney disease.  Strict ED return precautions discussed.  Dental resource guide provided.  Final Clinical Impressions(s) / UC Diagnoses   Final diagnoses:  Dental infection  Pain, dental     Discharge Instructions     -Start the antibiotic-Augmentin (amoxicillin-clavulanate), 1 pill every 12 hours for 7 days.  You can take this with food like with breakfast and dinner. -Flagyl (metronidazole) twice daily for 7 days. Avoid alcohol while on this medication as it can cause significant nausea.  -For dental pain, use lidocaine mouthwash up to every 4 hours. Make sure not to eat for at least 1 hour after using this, as your mouth will be very numb and you could bite yourself. -For additional pain relief, you can take Tylenol  1000 mg 3 times daily, and ibuprofen 800 mg 3 times daily with food.  You can take these together, or alternate every 3-4 hours. -Seek additional immediate medical attention if you develop shortness of breath, dizziness, chest pain, trouble swallowing, pain of the floor or roof of your mouth, etc. -if your symptoms don't improve in 2 days, seek additional medical attention. -Follow-up with dentist. Information later in this packet     ED Prescriptions    Medication Sig Dispense Auth. Provider   amoxicillin-clavulanate (AUGMENTIN) 875-125 MG tablet Take 1 tablet by mouth every 12 (twelve) hours. 14 tablet Hazel Sams, PA-C   metroNIDAZOLE (FLAGYL) 500 MG tablet Take 1 tablet (500 mg total) by mouth 2 (two) times daily. 14 tablet Marin Roberts E, PA-C   lidocaine (XYLOCAINE) 2 % solution Use as directed 15 mLs in the mouth or throat as needed for mouth pain. 100 mL Hazel Sams, PA-C   ibuprofen (ADVIL) 800 MG tablet Take 1 tablet (800 mg total) by mouth 3 (three) times daily. 21 tablet Hazel Sams, PA-C     I have reviewed the PDMP during this encounter.   Hazel Sams, PA-C 04/25/21 [phone]

## 2021-04-25 NOTE — ED Triage Notes (Signed)
Pt presents with left side dental pain and facial swelling that started yesterday.   States last dose of tylenol was around 1300/1400 today.

## 2021-04-25 NOTE — Discharge Instructions (Addendum)
-  Start the antibiotic-Augmentin (amoxicillin-clavulanate), 1 pill every 12 hours for 7 days.  You can take this with food like with breakfast and dinner. -Flagyl (metronidazole) twice daily for 7 days. Avoid alcohol while on this medication as it can cause significant nausea.  -For dental pain, use lidocaine mouthwash up to every 4 hours. Make sure not to eat for at least 1 hour after using this, as your mouth will be very numb and you could bite yourself. -For additional pain relief, you can take Tylenol 1000 mg 3 times daily, and ibuprofen 800 mg 3 times daily with food.  You can take these together, or alternate every 3-4 hours. -Seek additional immediate medical attention if you develop shortness of breath, dizziness, chest pain, trouble swallowing, pain of the floor or roof of your mouth, etc. -if your symptoms don't improve in 2 days, seek additional medical attention. -Follow-up with dentist. Information later in this packet

## 2021-05-02 ENCOUNTER — Other Ambulatory Visit: Payer: Self-pay

## 2021-05-02 ENCOUNTER — Ambulatory Visit (INDEPENDENT_AMBULATORY_CARE_PROVIDER_SITE_OTHER): Payer: Medicare HMO

## 2021-05-02 ENCOUNTER — Ambulatory Visit (INDEPENDENT_AMBULATORY_CARE_PROVIDER_SITE_OTHER): Payer: Medicare HMO | Admitting: Podiatry

## 2021-05-02 ENCOUNTER — Encounter: Payer: Self-pay | Admitting: Podiatry

## 2021-05-02 DIAGNOSIS — M722 Plantar fascial fibromatosis: Secondary | ICD-10-CM

## 2021-05-02 MED ORDER — MELOXICAM 15 MG PO TABS
15.0000 mg | ORAL_TABLET | Freq: Every day | ORAL | 3 refills | Status: DC
  Filled 2021-05-02: qty 30, 30d supply, fill #0

## 2021-05-02 NOTE — Patient Instructions (Signed)

## 2021-05-03 ENCOUNTER — Encounter: Payer: Self-pay | Admitting: Podiatry

## 2021-05-03 ENCOUNTER — Other Ambulatory Visit: Payer: Self-pay

## 2021-05-03 NOTE — Progress Notes (Signed)
  Subjective:  Patient ID: Karen Tanner, female    DOB: 1971/04/08,  MRN: [id number]  Chief Complaint  Patient presents with  . Foot Pain    (np) Pain of left heel    50 y.o. female presents with the above complaint. History confirmed with patient.  Present for over 1 year.  Pain is on the plantar heel.  She is also having knee pain on the right side and is asking who she should see for that  Objective:  Physical Exam: warm, good capillary refill, no trophic changes or ulcerative lesions, normal DP and PT pulses and normal sensory exam. Left Foot: point tenderness over the heel pad   Radiographs: X-ray of the left foot: no fracture, dislocation, swelling or degenerative changes noted and plantar calcaneal spur Assessment:   1. Plantar fasciitis, left      Plan:  Patient was evaluated and treated and all questions answered.  Discussed the etiology and treatment options for plantar fasciitis including stretching, formal physical therapy, supportive shoegears such as a running shoe or sneaker, pre fabricated orthoses, injection therapy, and oral medications. We also discussed the role of surgical treatment of this for patients who do not improve after exhausting non-surgical treatment options.    -XR reviewed with patient -Educated patient on stretching and icing of the affected limb -Plantar fascial brace dispensed -Injection delivered to the plantar fascia of the left foot. -Rx for meloxicam. Educated on use, risks and benefits of the medication  After sterile prep with povidone-iodine solution and alcohol, the left heel was injected with 0.5cc 2% xylocaine plain, 0.5cc 0.5% marcaine plain, 5mg  triamcinolone acetonide, and 2mg  dexamethasone was injected along  the plantar fascia at the insertion on the plantar calcaneus. The patient tolerated the procedure well without complication.   Return in about 6 weeks (around 06/13/2021).

## 2021-06-05 ENCOUNTER — Encounter: Payer: Medicare HMO | Admitting: Internal Medicine

## 2021-06-19 ENCOUNTER — Ambulatory Visit: Payer: Medicare Other | Admitting: Podiatry

## 2021-06-20 ENCOUNTER — Encounter: Payer: Medicare HMO | Admitting: Obstetrics & Gynecology

## 2021-07-04 ENCOUNTER — Ambulatory Visit: Payer: Medicare HMO | Admitting: Podiatry

## 2021-07-11 ENCOUNTER — Encounter: Payer: Medicare HMO | Admitting: Internal Medicine

## 2021-07-11 ENCOUNTER — Inpatient Hospital Stay: Admission: RE | Admit: 2021-07-11 | Payer: Medicare HMO | Source: Ambulatory Visit

## 2021-07-17 ENCOUNTER — Ambulatory Visit (INDEPENDENT_AMBULATORY_CARE_PROVIDER_SITE_OTHER): Payer: Medicare HMO | Admitting: Podiatry

## 2021-07-17 ENCOUNTER — Encounter: Payer: Self-pay | Admitting: Podiatry

## 2021-07-17 ENCOUNTER — Other Ambulatory Visit: Payer: Self-pay

## 2021-07-17 DIAGNOSIS — L84 Corns and callosities: Secondary | ICD-10-CM

## 2021-07-17 DIAGNOSIS — M722 Plantar fascial fibromatosis: Secondary | ICD-10-CM

## 2021-07-17 NOTE — Patient Instructions (Signed)
Look for urea 40% cream or ointment and apply to the thickened dry skin / calluses. This can be bought over the counter, at a pharmacy or online such as [employer].  

## 2021-07-18 NOTE — Progress Notes (Signed)
  Subjective:  Patient ID: Karen Tanner, female    DOB: 1971-03-15,  MRN: [id number]  Chief Complaint  Patient presents with   Plantar Fasciitis    6 week follow up left foot    50 y.o. female presents with the above complaint. Heel pain has essentially resolved after the last injection.  She also is a painful callus lesion on the inside of the right big toe  Objective:  Physical Exam: warm, good capillary refill, no trophic changes or ulcerative lesions, normal DP and PT pulses and normal sensory exam. Left Foot: No heel pain today Right foot medial pinch callus hallux  Radiographs: X-ray of the left foot: no fracture, dislocation, swelling or degenerative changes noted and plantar calcaneal spur Assessment:   No diagnosis found.    Plan:  Patient was evaluated and treated and all questions answered.  Planter fasciitis seems resolved after the last injection I recommended she continue her stretching and therapy exercises.  I discussed treatment of calluses with urea cream she will continue to get these shaved by her pedicurist.   Return if symptoms worsen or fail to improve.

## 2021-08-02 ENCOUNTER — Ambulatory Visit: Payer: Medicare HMO

## 2021-08-03 DIAGNOSIS — H35033 Hypertensive retinopathy, bilateral: Secondary | ICD-10-CM | POA: Diagnosis not present

## 2021-08-03 DIAGNOSIS — H52223 Regular astigmatism, bilateral: Secondary | ICD-10-CM | POA: Diagnosis not present

## 2021-08-03 DIAGNOSIS — H04123 Dry eye syndrome of bilateral lacrimal glands: Secondary | ICD-10-CM | POA: Diagnosis not present

## 2021-08-03 DIAGNOSIS — D3132 Benign neoplasm of left choroid: Secondary | ICD-10-CM | POA: Diagnosis not present

## 2021-08-03 DIAGNOSIS — D3131 Benign neoplasm of right choroid: Secondary | ICD-10-CM | POA: Diagnosis not present

## 2021-08-03 DIAGNOSIS — E119 Type 2 diabetes mellitus without complications: Secondary | ICD-10-CM | POA: Diagnosis not present

## 2021-08-03 DIAGNOSIS — H25813 Combined forms of age-related cataract, bilateral: Secondary | ICD-10-CM | POA: Diagnosis not present

## 2021-08-03 DIAGNOSIS — H524 Presbyopia: Secondary | ICD-10-CM | POA: Diagnosis not present

## 2021-08-03 DIAGNOSIS — H5213 Myopia, bilateral: Secondary | ICD-10-CM | POA: Diagnosis not present

## 2021-08-03 LAB — HM DIABETES EYE EXAM

## 2021-08-07 ENCOUNTER — Encounter: Payer: Medicare HMO | Admitting: Internal Medicine

## 2021-08-09 ENCOUNTER — Encounter: Payer: Self-pay | Admitting: Internal Medicine

## 2021-08-10 ENCOUNTER — Encounter: Payer: Medicare HMO | Admitting: Obstetrics & Gynecology

## 2021-08-17 ENCOUNTER — Ambulatory Visit: Payer: Medicare HMO | Admitting: Internal Medicine

## 2021-09-05 ENCOUNTER — Ambulatory Visit: Payer: Medicare HMO

## 2021-09-20 ENCOUNTER — Encounter: Payer: Medicare HMO | Admitting: Obstetrics & Gynecology

## 2021-10-06 ENCOUNTER — Ambulatory Visit: Payer: Medicare HMO

## 2021-10-12 ENCOUNTER — Emergency Department (HOSPITAL_COMMUNITY)
Admission: EM | Admit: 2021-10-12 | Discharge: 2021-10-12 | Disposition: A | Discharge status: Home / Self Care | Payer: Medicare HMO | Attending: Emergency Medicine | Admitting: Emergency Medicine

## 2021-10-12 ENCOUNTER — Other Ambulatory Visit: Payer: Self-pay

## 2021-10-12 ENCOUNTER — Encounter (HOSPITAL_COMMUNITY): Payer: Self-pay | Admitting: Emergency Medicine

## 2021-10-12 ENCOUNTER — Emergency Department (HOSPITAL_COMMUNITY): Payer: Medicare HMO

## 2021-10-12 DIAGNOSIS — Z7984 Long term (current) use of oral hypoglycemic drugs: Secondary | ICD-10-CM | POA: Insufficient documentation

## 2021-10-12 DIAGNOSIS — M1711 Unilateral primary osteoarthritis, right knee: Secondary | ICD-10-CM | POA: Diagnosis not present

## 2021-10-12 DIAGNOSIS — M545 Low back pain, unspecified: Secondary | ICD-10-CM | POA: Diagnosis not present

## 2021-10-12 DIAGNOSIS — Z79899 Other long term (current) drug therapy: Secondary | ICD-10-CM | POA: Diagnosis not present

## 2021-10-12 DIAGNOSIS — Z87891 Personal history of nicotine dependence: Secondary | ICD-10-CM | POA: Insufficient documentation

## 2021-10-12 DIAGNOSIS — E119 Type 2 diabetes mellitus without complications: Secondary | ICD-10-CM | POA: Insufficient documentation

## 2021-10-12 DIAGNOSIS — M25561 Pain in right knee: Secondary | ICD-10-CM | POA: Insufficient documentation

## 2021-10-12 DIAGNOSIS — M25461 Effusion, right knee: Secondary | ICD-10-CM | POA: Diagnosis not present

## 2021-10-12 DIAGNOSIS — I10 Essential (primary) hypertension: Secondary | ICD-10-CM | POA: Diagnosis not present

## 2021-10-12 MED ORDER — METHOCARBAMOL 500 MG PO TABS
500.0000 mg | ORAL_TABLET | Freq: Once | ORAL | Status: AC
  Administered 2021-10-12: 500 mg via ORAL
  Filled 2021-10-12: qty 1

## 2021-10-12 MED ORDER — LIDOCAINE 5 % EX PTCH
1.0000 | MEDICATED_PATCH | Freq: Once | CUTANEOUS | Status: DC
  Administered 2021-10-12: 1 via TRANSDERMAL
  Filled 2021-10-12: qty 1

## 2021-10-12 MED ORDER — METHOCARBAMOL 500 MG PO TABS: 500.0000 mg | ORAL_TABLET | Freq: Two times a day (BID) | ORAL | 0 refills | Status: DC

## 2021-10-12 MED ORDER — NAPROXEN 500 MG PO TABS: 500.0000 mg | ORAL_TABLET | Freq: Two times a day (BID) | ORAL | 0 refills | Status: DC

## 2021-10-12 MED ORDER — KETOROLAC TROMETHAMINE 15 MG/ML IJ SOLN
15.0000 mg | Freq: Once | INTRAMUSCULAR | Status: AC
  Administered 2021-10-12: 15 mg via INTRAMUSCULAR
  Filled 2021-10-12: qty 1

## 2021-10-12 NOTE — ED Triage Notes (Signed)
Patient here from home reporting right knee pain and lower back pain. States that she needs a work note to return. Pain 10/10.

## 2021-10-12 NOTE — ED Provider Notes (Signed)
Karen Tanner   CSN: [id number] Arrival date & time: 10/12/21  1011     History Chief Complaint  Patient presents with   Back Pain   Knee Pain    Karen Tanner is a 50 y.o. female.  Karen Tanner is a 50 y.o. female with hx of HTN, DM, anemia, and arthritis, who presents to the ED for evaluation of low back pain and right knee pain. Patient reports pain in the center of her low back that has been worsening over the past week. No injury or trauma, but pateint reports she is on her feet and moving and lifting a lot for work. Pain does not radiate. No associated numbness, weakness, loss of bowel or bladder control or saddle anesthesia. No fevers, no hx of caner or IVDU.  PT also reports right knee pain, has known hx of of arthritis. Reports increasing right knee pain over the past few months with some intermittent swelling. Pain worse when she is on her feet. Has tried tylenol with minimal relief. No other aggravating of alleviating factors. Hx of L-spine surgery many years ago.  The history is provided by the patient.      Past Medical History:  Diagnosis Date   Anemia    Diabetes mellitus without complication ([place])    Hypertension    Hypertensive retinopathy     Patient Active Problem List   Diagnosis Date Noted   Drinking binge 04/17/2021   Tobacco dependence 04/17/2021   Hyperlipidemia associated with type 2 diabetes mellitus ([place]) 04/17/2021   Uterine leiomyoma 08/21/2018   Type 2 diabetes mellitus with obesity ([place]) 05/21/2016   History of back surgery 10/26/2014   History of anemia 10/26/2014   Hypertension associated with diabetes ([place]) 10/26/2014   Chronic low back pain 10/26/2014   Menorrhagia with regular cycle 10/26/2014    Past Surgical History:  Procedure Laterality Date   BACK SURGERY     Bone fusion     OB History   No obstetric history on file.     Family History  Problem Relation Age of Onset    Diabetes Mother    Hyperlipidemia Mother    Heart disease Mother    Diabetes Father    Diabetes Maternal Grandmother     Social History   Tobacco Use   Smoking status: Former    Packs/day: 0.25    Years: 14.00    Pack years: 3.50    Types: Cigarettes   Smokeless tobacco: Never   Tobacco comments:    Smoking 6-7 cigs per day  Substance Use Topics   Alcohol use: Yes    Alcohol/week: 0.0 standard drinks    Home Medications Prior to Admission medications   Medication Sig Start Date End Date Taking? Authorizing Provider  atorvastatin (LIPITOR) 10 MG tablet Take 1 tablet (10 mg total) by mouth daily. 04/19/21   Ladell Pier, MD  amLODipine (NORVASC) 10 MG tablet Take 1 tablet (10 mg total) by mouth daily. 04/17/21   Ladell Pier, MD  amoxicillin-clavulanate (AUGMENTIN) 875-125 MG tablet Take 1 tablet by mouth every 12 (twelve) hours. 04/25/21   Hazel Sams, PA-C  lidocaine (XYLOCAINE) 2 % solution Use as directed 15 mLs in the mouth or throat as needed for mouth pain. 04/25/21   Hazel Sams, PA-C  meloxicam (MOBIC) 15 MG tablet Take 1 tablet (15 mg total) by mouth daily. 05/02/21   Criselda Peaches, DPM  metFORMIN (GLUCOPHAGE)  500 MG tablet Take 1 tablet (500 mg total) by mouth daily with breakfast. 04/17/21   Ladell Pier, MD  metroNIDAZOLE (FLAGYL) 500 MG tablet Take 1 tablet (500 mg total) by mouth 2 (two) times daily. 04/25/21   Hazel Sams, PA-C    Allergies    Patient has no known allergies.  Review of Systems   Review of Systems  Constitutional:  Negative for chills and fever.  HENT: Negative.    Respiratory:  Negative for shortness of breath.   Cardiovascular:  Negative for chest pain.  Gastrointestinal:  Negative for abdominal pain, constipation, diarrhea, nausea and vomiting.  Genitourinary:  Negative for dysuria, flank pain, frequency and hematuria.  Musculoskeletal:  Positive for arthralgias and back pain. Negative for gait problem, joint  swelling, myalgias and neck pain.  Skin:  Negative for color change, rash and wound.  Neurological:  Negative for weakness and numbness.  All other systems reviewed and are negative.  Physical Exam Updated Vital Signs BP (!) 199/104 (BP Location: Right Arm)   Pulse 76   Temp 98.2 F (36.8 C) (Oral)   Resp 18   SpO2 97%   Physical Exam Vitals and nursing Tanner reviewed.  Constitutional:      General: She is not in acute distress.    Appearance: She is well-developed. She is not diaphoretic.  HENT:     Head: Atraumatic.  Eyes:     General:        Right eye: No discharge.        Left eye: No discharge.  Cardiovascular:     Pulses:          Radial pulses are 2+ on the right side and 2+ on the left side.       Dorsalis pedis pulses are 2+ on the right side and 2+ on the left side.       Posterior tibial pulses are 2+ on the right side and 2+ on the left side.  Pulmonary:     Effort: Pulmonary effort is normal. No respiratory distress.  Abdominal:     General: Bowel sounds are normal. There is no distension.     Palpations: Abdomen is soft. There is no mass.     Tenderness: There is no abdominal tenderness. There is no guarding.     Comments: Abdomen soft, nondistended, nontender to palpation in all quadrants without guarding or peritoneal signs, no CVA tenderness bilaterally  Musculoskeletal:        General: Tenderness present.     Cervical back: Neck supple.     Comments: Tenderness to palpation over midline lumbar spine.  No step-off or palpable deformity.  Pain made worse with range of motion of the lower extremities, negative straight leg raise bilaterally. Tenderness over the right knee without palpable swelling or effusion, no overlying erythema or warmth, able to flex and extend with mild discomfort.  Skin:    General: Skin is warm and dry.     Capillary Refill: Capillary refill takes less than 2 seconds.  Neurological:     Mental Status: She is alert and oriented to  person, place, and time.     Comments: Alert, clear speech, following commands. Moving all extremities without difficulty. Bilateral lower extremities with 5/5 strength in proximal and distal muscle groups and with dorsi and plantar flexion. Sensation intact in bilateral lower extremities. 2+ patellar DTRs bilaterally. Ambulatory with steady gait  Psychiatric:        Mood and Affect: Mood  normal.        Behavior: Behavior normal.    ED Results / Procedures / Treatments   Labs (all labs ordered are listed, but only abnormal results are displayed) Labs Reviewed - No data to display  EKG None  Radiology DG Lumbar Spine Complete  Result Date: 10/12/2021 CLINICAL DATA:  Back pain EXAM: LUMBAR SPINE - COMPLETE 4+ VIEW COMPARISON:  2015 FINDINGS: Postoperative changes are again identified at L5-S1 with rods and pedicle screws and interbody fusion device. No evidence of complication. Similar vertebral body heights and alignment. Mild lower lumbar disc space narrowing without significant progression. IMPRESSION: Postoperative and mild degenerative changes. Appearance is similar to 2015. Electronically Signed   By: Macy Mis M.D.   On: 10/12/2021 14:48   DG Knee Complete 4 Views Right  Result Date: 10/12/2021 CLINICAL DATA:  Diffuse knee pain EXAM: RIGHT KNEE - COMPLETE 4+ VIEW COMPARISON:  None. FINDINGS: No evidence of fracture or dislocation. Tricompartmental degenerative joint space narrowing. Small joint effusion. Soft tissues are unremarkable. IMPRESSION: Small joint effusion.  No acute fracture or dislocation. Electronically Signed   By: Merilyn Baba M.D.   On: 10/12/2021 11:54    Procedures Procedures   Medications Ordered in ED Medications  lidocaine (LIDODERM) 5 % 1 patch (1 patch Transdermal Patch Applied 10/12/21 1428)  ketorolac (TORADOL) 15 MG/ML injection 15 mg (15 mg Intramuscular Given 10/12/21 1427)  methocarbamol (ROBAXIN) tablet 500 mg (500 mg Oral Given 10/12/21  1427)    ED Course  I have reviewed the triage vital signs and the nursing notes.  Pertinent labs & imaging results that were available during my care of the patient were reviewed by me and considered in my medical decision making (see chart for details).    MDM Rules/Calculators/A&P                           Patient with back pain.  No neurological deficits and normal neuro exam.  Patient can walk but states is painful.  No loss of bowel or bladder control.  No concern for cauda equina.  No fever, night sweats, weight loss, h/o cancer, IVDU.  X-rays of the L-spine are reassuring, prior surgical changes are intact. Right knee fils with small effusion, otherwise normal. No concern for septic arthritis.  Knee sleeve provided. RICE protocol and pain medicine indicated and discussed with patient.    Final Clinical Impression(s) / ED Diagnoses Final diagnoses:  Acute midline low back pain without sciatica  Acute pain of right knee    Rx / DC Orders ED Discharge Orders          Ordered    naproxen (NAPROSYN) 500 MG tablet  2 times daily        10/12/21 1506    methocarbamol (ROBAXIN) 500 MG tablet  2 times daily        10/12/21 1506             Benedetto Goad Rayne, [place] 10/13/21 2045    Malvin Johns, MD 10/16/21 1500

## 2021-10-12 NOTE — ED Notes (Signed)
Knee brace in place by ortho tech. No signs of distres

## 2021-10-12 NOTE — Discharge Instructions (Addendum)
Your x-ray is reassuring today.  Please take naproxen twice daily with food on your stomach to help with pain and inflammation.  You can use Robaxin as needed for muscle spasm.  You can also use over-the-counter Salonpas lidocaine patches to help with pain.  You should try and do some light stretching and movement daily, laying in the bed will lead to worsening muscle stiffness and pain.  Return if you develop numbness, weakness, fevers or other new or concerning symptoms.  You will need to follow-up with your PCP and/or orthopedics for further evaluation of your symptoms.  You can use knee sleeve while up and walking around, especially when you are back at work.

## 2021-10-17 ENCOUNTER — Ambulatory Visit: Payer: Medicare HMO | Admitting: Internal Medicine

## 2021-10-25 DIAGNOSIS — M25561 Pain in right knee: Secondary | ICD-10-CM | POA: Insufficient documentation

## 2021-10-26 DIAGNOSIS — M1711 Unilateral primary osteoarthritis, right knee: Secondary | ICD-10-CM | POA: Insufficient documentation

## 2021-11-01 ENCOUNTER — Other Ambulatory Visit: Payer: Self-pay

## 2021-11-01 ENCOUNTER — Ambulatory Visit
Admission: RE | Admit: 2021-11-01 | Discharge: 2021-11-01 | Disposition: A | Discharge status: Home / Self Care | Payer: Medicare HMO | Source: Ambulatory Visit | Attending: Internal Medicine | Admitting: Internal Medicine

## 2021-11-01 DIAGNOSIS — Z1231 Encounter for screening mammogram for malignant neoplasm of breast: Secondary | ICD-10-CM

## 2021-11-02 DIAGNOSIS — M1711 Unilateral primary osteoarthritis, right knee: Secondary | ICD-10-CM | POA: Diagnosis not present

## 2021-11-02 DIAGNOSIS — M5451 Vertebrogenic low back pain: Secondary | ICD-10-CM | POA: Diagnosis not present

## 2021-11-03 ENCOUNTER — Telehealth: Payer: Self-pay

## 2021-11-03 ENCOUNTER — Other Ambulatory Visit: Payer: Self-pay | Admitting: Internal Medicine

## 2021-11-03 DIAGNOSIS — R928 Other abnormal and inconclusive findings on diagnostic imaging of breast: Secondary | ICD-10-CM

## 2021-11-03 NOTE — Telephone Encounter (Signed)
Contacted pt to go over MM results pt states she has already received results and she has appointments scheduled for future image

## 2021-11-07 ENCOUNTER — Other Ambulatory Visit: Payer: Medicare HMO

## 2021-11-30 DIAGNOSIS — D3132 Benign neoplasm of left choroid: Secondary | ICD-10-CM | POA: Diagnosis not present

## 2021-11-30 DIAGNOSIS — H25813 Combined forms of age-related cataract, bilateral: Secondary | ICD-10-CM | POA: Diagnosis not present

## 2021-11-30 DIAGNOSIS — D3131 Benign neoplasm of right choroid: Secondary | ICD-10-CM | POA: Diagnosis not present

## 2021-11-30 DIAGNOSIS — H04123 Dry eye syndrome of bilateral lacrimal glands: Secondary | ICD-10-CM | POA: Diagnosis not present

## 2021-12-01 ENCOUNTER — Other Ambulatory Visit: Payer: Self-pay | Admitting: Internal Medicine

## 2021-12-01 ENCOUNTER — Other Ambulatory Visit: Payer: Self-pay

## 2021-12-01 ENCOUNTER — Ambulatory Visit
Admission: RE | Admit: 2021-12-01 | Discharge: 2021-12-01 | Disposition: A | Discharge status: Home / Self Care | Payer: Medicare HMO | Source: Ambulatory Visit | Attending: Internal Medicine | Admitting: Internal Medicine

## 2021-12-01 ENCOUNTER — Ambulatory Visit: Payer: Medicare HMO

## 2021-12-01 DIAGNOSIS — R921 Mammographic calcification found on diagnostic imaging of breast: Secondary | ICD-10-CM | POA: Diagnosis not present

## 2021-12-01 DIAGNOSIS — R928 Other abnormal and inconclusive findings on diagnostic imaging of breast: Secondary | ICD-10-CM

## 2021-12-01 DIAGNOSIS — R922 Inconclusive mammogram: Secondary | ICD-10-CM | POA: Diagnosis not present

## 2021-12-05 ENCOUNTER — Other Ambulatory Visit: Payer: Self-pay

## 2021-12-05 ENCOUNTER — Encounter: Payer: Self-pay | Admitting: Internal Medicine

## 2021-12-05 ENCOUNTER — Ambulatory Visit: Payer: Medicare HMO | Attending: Internal Medicine | Admitting: Internal Medicine

## 2021-12-05 ENCOUNTER — Other Ambulatory Visit (HOSPITAL_COMMUNITY)
Admission: RE | Admit: 2021-12-05 | Discharge: 2021-12-05 | Disposition: A | Discharge status: Home / Self Care | Payer: Medicare HMO | Source: Ambulatory Visit | Attending: Internal Medicine | Admitting: Internal Medicine

## 2021-12-05 VITALS — BP 172/92 | HR 65 | Resp 16 | Wt 266.0 lb

## 2021-12-05 DIAGNOSIS — Z01419 Encounter for gynecological examination (general) (routine) without abnormal findings: Secondary | ICD-10-CM | POA: Diagnosis not present

## 2021-12-05 DIAGNOSIS — Z124 Encounter for screening for malignant neoplasm of cervix: Secondary | ICD-10-CM | POA: Diagnosis not present

## 2021-12-05 DIAGNOSIS — Z6841 Body Mass Index (BMI) 40.0 and over, adult: Secondary | ICD-10-CM | POA: Diagnosis not present

## 2021-12-05 DIAGNOSIS — Z23 Encounter for immunization: Secondary | ICD-10-CM | POA: Diagnosis not present

## 2021-12-05 DIAGNOSIS — I10 Essential (primary) hypertension: Secondary | ICD-10-CM

## 2021-12-05 DIAGNOSIS — R928 Other abnormal and inconclusive findings on diagnostic imaging of breast: Secondary | ICD-10-CM | POA: Diagnosis not present

## 2021-12-05 DIAGNOSIS — M1711 Unilateral primary osteoarthritis, right knee: Secondary | ICD-10-CM | POA: Diagnosis not present

## 2021-12-05 DIAGNOSIS — Z1151 Encounter for screening for human papillomavirus (HPV): Secondary | ICD-10-CM | POA: Insufficient documentation

## 2021-12-05 DIAGNOSIS — Z1211 Encounter for screening for malignant neoplasm of colon: Secondary | ICD-10-CM

## 2021-12-05 MED ORDER — MELOXICAM 15 MG PO TABS
15.0000 mg | ORAL_TABLET | Freq: Every day | ORAL | 3 refills | Status: DC
  Filled 2021-12-05: qty 30, 30d supply, fill #0

## 2021-12-05 MED ORDER — AMLODIPINE BESYLATE 10 MG PO TABS
10.0000 mg | ORAL_TABLET | Freq: Every day | ORAL | 1 refills | Status: DC
  Filled 2021-12-05: qty 90, 90d supply, fill #0

## 2021-12-05 NOTE — Progress Notes (Signed)
Patient ID: Karen Tanner, female    DOB: 05-12-1971  MRN: [id number]  CC: Gynecologic Exam   Subjective: Karen Tanner is a 50 y.o. female who presents for gyn/Pap smear.  Last seen by me in April of this year.  She canceled appointment with me in July and no showed her appointment last month. Her concerns today include:  Patient with history of HTN, diabetes, fibroid, HL, anemia, tob dep, chronic LBP had fusion surgery 2004  GYN History:  Pt is G1P0 Any hx of abn paps?: no Menses regular or irregular?: no.  Perimenopausal.  Last cycle 4 mths ago.had 5 this yr How long does menses last?  2 wks.  Hx of fibroids Menstrual flow light or heavy?:  heavy, 2 boxes tampons during the 2 wks Method of birth control?:  no Any vaginal dischg at this time?: no Dysuria?:  Any hx of STI?:  no Sexually active with how many partners:  1 female Desires STI screen: yes Last MMG:  Family hx of uterine, cervical or breast cancer?:   no but pt had recent abn MMG.  Scheduled for bx on LT breast next Friday   Complains of right knee pain.  Reportedly saw orthopedics about 1 to 2 months ago.  She does not remember the name of the orthopedic specialist whom she saw.  She was given an injection to the right knee which she states helped for several weeks.  But she still has pain with prolonged standing and walking.  She is requesting a pain medication.  She tells me that he had given her Naprosyn.  He referred her to physical therapy but she had to reschedule it.    Blood pressure elevated today.  She is on amlodipine 10 mg daily.  Looks like she is not done a recent refill since last prescription in April of this year.  She admits that she was not taking it consistently.Marland Kitchen  HM: Due for PCV 20 vaccine today.  Referred for colonoscopy the last time I saw her.  Looks like she was never called.  She would like to be referred again. Patient Active Problem List   Diagnosis Date Noted   Drinking binge  04/17/2021   Tobacco dependence 04/17/2021   Hyperlipidemia associated with type 2 diabetes mellitus ([place]) 04/17/2021   Uterine leiomyoma 08/21/2018   Type 2 diabetes mellitus with obesity ([place]) 05/21/2016   History of back surgery 10/26/2014   History of anemia 10/26/2014   Hypertension associated with diabetes ([place]) 10/26/2014   Chronic low back pain 10/26/2014   Menorrhagia with regular cycle 10/26/2014     Current Outpatient Medications on File Prior to Visit  Medication Sig Dispense Refill   atorvastatin (LIPITOR) 10 MG tablet Take 1 tablet (10 mg total) by mouth daily. 90 tablet 1   lidocaine (XYLOCAINE) 2 % solution Use as directed 15 mLs in the mouth or throat as needed for mouth pain. (Patient not taking: Reported on 12/05/2021) 100 mL 0   metFORMIN (GLUCOPHAGE) 500 MG tablet Take 1 tablet (500 mg total) by mouth daily with breakfast. 30 tablet 5   methocarbamol (ROBAXIN) 500 MG tablet Take 1 tablet (500 mg total) by mouth 2 (two) times daily. 20 tablet 0   No current facility-administered medications on file prior to visit.    No Known Allergies  Social History   Socioeconomic History   Marital status: Single    Spouse name: Not on file   Number of children: Not on  file   Years of education: Not on file   Highest education level: Not on file  Occupational History   Not on file  Tobacco Use   Smoking status: Former    Packs/day: 0.25    Years: 14.00    Pack years: 3.50    Types: Cigarettes   Smokeless tobacco: Never   Tobacco comments:    Smoking 6-7 cigs per day  Substance and Sexual Activity   Alcohol use: Yes    Alcohol/week: 0.0 standard drinks   Drug use: Not on file   Sexual activity: Not on file  Other Topics Concern   Not on file  Social History Narrative   Not on file   Social Determinants of Health   Financial Resource Strain: Not on file  Food Insecurity: Not on file  Transportation Needs: Not on file  Physical Activity: Not on file   Stress: Not on file  Social Connections: Not on file  Intimate Partner Violence: Not on file    Family History  Problem Relation Age of Onset   Diabetes Mother    Hyperlipidemia Mother    Heart disease Mother    Diabetes Father    Diabetes Maternal Grandmother     Past Surgical History:  Procedure Laterality Date   BACK SURGERY     Bone fusion    ROS: Review of Systems Negative except as stated above  PHYSICAL EXAM: BP (!) 172/92   Pulse 65   Resp 16   Wt 266 lb (120.7 kg)   SpO2 98%   BMI 41.66 kg/m   Physical Exam  General appearance - alert, well appearing, and in no distress Pelvic -[employer] student Deritra McNairy present as chaperone: Normal external genitalia, vulva, vagina, cervix, uterus and adnexa Musculoskeletal -right knee: Mild joint enlargement.  No point tenderness.  Mild to moderate crepitus on passive range of motion.   CMP Latest Ref Rng & Units 04/17/2021 08/21/2018 03/25/2018  Glucose 65 - 99 mg/dL 108(H) 117(H) 196(H)  BUN 6 - 24 mg/dL 15 10 6   Creatinine 0.57 - 1.00 mg/dL 0.92 0.72 0.70  Sodium 134 - 144 mmol/L 143 141 137  Potassium 3.5 - 5.2 mmol/L 4.4 4.0 3.4(L)  Chloride 96 - 106 mmol/L 102 102 104  CO2 20 - 29 mmol/L 24 23 25   Calcium 8.7 - 10.2 mg/dL 10.0 9.4 8.9  Total Protein 6.0 - 8.5 g/dL 7.4 7.0 7.0  Total Bilirubin 0.0 - 1.2 mg/dL 0.4 0.2 0.4  Alkaline Phos 44 - 121 IU/L 94 65 60  AST 0 - 40 IU/L 20 20 26   ALT 0 - 32 IU/L 19 17 18    Lipid Panel     Component Value Date/Time   CHOL 212 (H) 04/17/2021 1705   TRIG 101 04/17/2021 1705   HDL 73 04/17/2021 1705   CHOLHDL 2.9 04/17/2021 1705   CHOLHDL 3.2 01/23/2016 0956   VLDL 9 01/23/2016 0956   LDLCALC 121 (H) 04/17/2021 1705    CBC    Component Value Date/Time   WBC 6.1 04/17/2021 1705   WBC 16.4 (H) 03/25/2018 1520   RBC 5.07 04/17/2021 1705   RBC 4.68 03/25/2018 1520   HGB 14.1 04/17/2021 1705   HCT 43.6 04/17/2021 1705   PLT 292 04/17/2021 1705   MCV 86 04/17/2021  1705   MCH 27.8 04/17/2021 1705   MCH 22.2 (L) 03/25/2018 1520   MCHC 32.3 04/17/2021 1705   MCHC 31.0 03/25/2018 1520   RDW 13.7 04/17/2021 1705  LYMPHSABS 1.0 03/25/2018 1520   MONOABS 0.7 03/25/2018 1520   EOSABS 0.1 03/25/2018 1520   BASOSABS 0.0 03/25/2018 1520    ASSESSMENT AND PLAN: 1. Pap smear for cervical cancer screening - Cervicovaginal ancillary only - Cytology - PAP  2. Abnormal mammogram Patient will keep appointment for biopsy on the left breast for abnormal mammogram findings.  3. Primary osteoarthritis of right knee Stop Naprosyn.  Refill meloxicam. - meloxicam (MOBIC) 15 MG tablet; Take 1 tablet (15 mg total) by mouth daily.  Dispense: 30 tablet; Refill: 3  4. Essential hypertension Uncontrolled.  Discussed health risks associated with uncontrolled blood pressure.  Encouraged her to take amlodipine daily as prescribed. - amLODipine (NORVASC) 10 MG tablet; Take 1 tablet (10 mg total) by mouth daily.  Dispense: 90 tablet; Refill: 1  5. Screening for colon cancer - Ambulatory referral to Gastroenterology  6. Need for vaccination for Strep pneumoniae - Pneumococcal conjugate vaccine 20-valent     Patient was given the opportunity to ask questions.  Patient verbalized understanding of the plan and was able to repeat key elements of the plan.   Orders Placed This Encounter  Procedures   Pneumococcal conjugate vaccine 20-valent   Ambulatory referral to Gastroenterology     Requested Prescriptions   Signed Prescriptions Disp Refills   meloxicam (MOBIC) 15 MG tablet 30 tablet 3    Sig: Take 1 tablet (15 mg total) by mouth daily.   amLODipine (NORVASC) 10 MG tablet 90 tablet 1    Sig: Take 1 tablet (10 mg total) by mouth daily.    Return in about 6 weeks (around 01/16/2022) for for chronic ds management.  Karle Plumber, MD, FACP

## 2021-12-06 ENCOUNTER — Other Ambulatory Visit: Payer: Self-pay

## 2021-12-07 ENCOUNTER — Other Ambulatory Visit: Payer: Self-pay | Admitting: Internal Medicine

## 2021-12-07 LAB — CERVICOVAGINAL ANCILLARY ONLY
Bacterial Vaginitis (gardnerella): POSITIVE — AB
Candida Glabrata: NEGATIVE
Candida Vaginitis: NEGATIVE
Chlamydia: NEGATIVE
Comment: NEGATIVE
Comment: NEGATIVE
Comment: NEGATIVE
Comment: NEGATIVE
Comment: NEGATIVE
Comment: NORMAL
Neisseria Gonorrhea: NEGATIVE
Trichomonas: NEGATIVE

## 2021-12-07 MED ORDER — METRONIDAZOLE 500 MG PO TABS
500.0000 mg | ORAL_TABLET | Freq: Two times a day (BID) | ORAL | 0 refills | Status: DC
  Filled 2021-12-07: qty 14, 7d supply, fill #0

## 2021-12-08 ENCOUNTER — Telehealth: Payer: Self-pay

## 2021-12-08 ENCOUNTER — Other Ambulatory Visit: Payer: Self-pay

## 2021-12-08 NOTE — Telephone Encounter (Signed)
Contacted pt to go over lab results pt is aware and doesn't have any questions

## 2021-12-12 ENCOUNTER — Telehealth: Payer: Self-pay

## 2021-12-12 LAB — CYTOLOGY - PAP
Adequacy: ABSENT
Comment: NEGATIVE
Diagnosis: NEGATIVE
High risk HPV: NEGATIVE

## 2021-12-12 NOTE — Telephone Encounter (Signed)
COntacted pt to go over lab results pt is aware and doesn't have any questions or concerns

## 2021-12-15 ENCOUNTER — Ambulatory Visit
Admission: RE | Admit: 2021-12-15 | Discharge: 2021-12-15 | Disposition: A | Discharge status: Home / Self Care | Payer: Medicare HMO | Source: Ambulatory Visit | Attending: Internal Medicine | Admitting: Internal Medicine

## 2021-12-15 ENCOUNTER — Other Ambulatory Visit: Payer: Self-pay | Admitting: Internal Medicine

## 2021-12-15 DIAGNOSIS — R921 Mammographic calcification found on diagnostic imaging of breast: Secondary | ICD-10-CM | POA: Diagnosis not present

## 2021-12-15 DIAGNOSIS — N6320 Unspecified lump in the left breast, unspecified quadrant: Secondary | ICD-10-CM

## 2021-12-15 DIAGNOSIS — N6314 Unspecified lump in the right breast, lower inner quadrant: Secondary | ICD-10-CM | POA: Diagnosis not present

## 2021-12-15 DIAGNOSIS — N6325 Unspecified lump in the left breast, overlapping quadrants: Secondary | ICD-10-CM | POA: Diagnosis not present

## 2021-12-15 DIAGNOSIS — D242 Benign neoplasm of left breast: Secondary | ICD-10-CM | POA: Diagnosis not present

## 2021-12-20 ENCOUNTER — Telehealth: Payer: Self-pay | Admitting: Internal Medicine

## 2021-12-20 ENCOUNTER — Encounter: Payer: Self-pay | Admitting: Internal Medicine

## 2021-12-20 DIAGNOSIS — D369 Benign neoplasm, unspecified site: Secondary | ICD-10-CM | POA: Insufficient documentation

## 2021-12-20 NOTE — Telephone Encounter (Signed)
Phone call placed to patient this morning.  Patient informed that I received the results of the breast biopsies that were done.  Lesion in the right breast was a fibroadenoma with calcifications.  Lesion in the left breast was an intraductal papilloma with calcifications.  Radiologist has recommended that she sees a Psychologist, sport and exercise for excision of the intraductal papilloma.  Patient is aware of her appointment with Dr. Marlou Starks at [facility] surgery on 01/04/2022.

## 2022-01-11 DIAGNOSIS — M5451 Vertebrogenic low back pain: Secondary | ICD-10-CM | POA: Diagnosis not present

## 2022-01-11 DIAGNOSIS — M1711 Unilateral primary osteoarthritis, right knee: Secondary | ICD-10-CM | POA: Diagnosis not present

## 2022-01-26 DIAGNOSIS — M5451 Vertebrogenic low back pain: Secondary | ICD-10-CM | POA: Diagnosis not present

## 2022-01-26 DIAGNOSIS — M25561 Pain in right knee: Secondary | ICD-10-CM | POA: Diagnosis not present

## 2022-02-01 ENCOUNTER — Ambulatory Visit: Payer: Medicare HMO | Attending: Internal Medicine | Admitting: Internal Medicine

## 2022-02-01 ENCOUNTER — Encounter: Payer: Self-pay | Admitting: Internal Medicine

## 2022-02-01 ENCOUNTER — Other Ambulatory Visit: Payer: Self-pay

## 2022-02-01 DIAGNOSIS — I10 Essential (primary) hypertension: Secondary | ICD-10-CM | POA: Diagnosis not present

## 2022-02-01 DIAGNOSIS — E785 Hyperlipidemia, unspecified: Secondary | ICD-10-CM

## 2022-02-01 DIAGNOSIS — M1711 Unilateral primary osteoarthritis, right knee: Secondary | ICD-10-CM | POA: Diagnosis not present

## 2022-02-01 DIAGNOSIS — E1169 Type 2 diabetes mellitus with other specified complication: Secondary | ICD-10-CM | POA: Diagnosis not present

## 2022-02-01 DIAGNOSIS — F17200 Nicotine dependence, unspecified, uncomplicated: Secondary | ICD-10-CM

## 2022-02-01 DIAGNOSIS — Z789 Other specified health status: Secondary | ICD-10-CM | POA: Diagnosis not present

## 2022-02-01 LAB — POCT GLYCOSYLATED HEMOGLOBIN (HGB A1C): HbA1c, POC (controlled diabetic range): 7.5 % — AB (ref 0.0–7.0)

## 2022-02-01 MED ORDER — ATORVASTATIN CALCIUM 10 MG PO TABS: 10.0000 mg | ORAL_TABLET | Freq: Every day | ORAL | 1 refills | Status: DC

## 2022-02-01 MED ORDER — AMLODIPINE BESYLATE 10 MG PO TABS: 10.0000 mg | ORAL_TABLET | Freq: Every day | ORAL | 1 refills | Status: DC

## 2022-02-01 MED ORDER — VALSARTAN 40 MG PO TABS: 40.0000 mg | ORAL_TABLET | Freq: Every day | ORAL | 3 refills | Status: DC

## 2022-02-01 MED ORDER — GLIMEPIRIDE 2 MG PO TABS: 1.0000 mg | ORAL_TABLET | Freq: Every day | ORAL | 3 refills | Status: DC

## 2022-02-01 MED ORDER — MELOXICAM 15 MG PO TABS: 15.0000 mg | ORAL_TABLET | Freq: Every day | ORAL | 3 refills | Status: DC

## 2022-02-01 MED ORDER — BLOOD GLUCOSE MONITOR KIT: PACK | 0 refills | Status: DC

## 2022-02-01 NOTE — Patient Instructions (Signed)
Healthy Eating °Following a healthy eating pattern may help you to achieve and maintain a healthy body weight, reduce the risk of chronic disease, and live a long and productive life. It is important to follow a healthy eating pattern at an appropriate calorie level for your body. Your nutritional needs should be met primarily through food by choosing a variety of nutrient-rich foods. °What are tips for following this plan? °Reading food labels °Read labels and choose the following: °Reduced or low sodium. °Juices with 100% fruit juice. °Foods with low saturated fats and high polyunsaturated and monounsaturated fats. °Foods with whole grains, such as whole wheat, cracked wheat, brown rice, and wild rice. °Whole grains that are fortified with folic acid. This is recommended for women who are pregnant or who want to become pregnant. °Read labels and avoid the following: °Foods with a lot of added sugars. These include foods that contain brown sugar, corn sweetener, corn syrup, dextrose, fructose, glucose, high-fructose corn syrup, honey, invert sugar, lactose, malt syrup, maltose, molasses, raw sugar, sucrose, trehalose, or turbinado sugar. °Do not eat more than the following amounts of added sugar per day: °6 teaspoons (25 g) for women. °9 teaspoons (38 g) for men. °Foods that contain processed or refined starches and grains. °Refined grain products, such as white flour, degermed cornmeal, white bread, and white rice. °Shopping °Choose nutrient-rich snacks, such as vegetables, whole fruits, and nuts. Avoid high-calorie and high-sugar snacks, such as potato chips, fruit snacks, and candy. °Use oil-based dressings and spreads on foods instead of solid fats such as butter, stick margarine, or cream cheese. °Limit pre-made sauces, mixes, and "instant" products such as flavored rice, instant noodles, and ready-made pasta. °Try more plant-protein sources, such as tofu, tempeh, black beans, edamame, lentils, nuts, and  seeds. °Explore eating plans such as the Mediterranean diet or vegetarian diet. °Cooking °Use oil to sauté or stir-fry foods instead of solid fats such as butter, stick margarine, or lard. °Try baking, boiling, grilling, or broiling instead of frying. °Remove the fatty part of meats before cooking. °Steam vegetables in water or broth. °Meal planning ° °At meals, imagine dividing your plate into fourths: °One-half of your plate is fruits and vegetables. °One-fourth of your plate is whole grains. °One-fourth of your plate is protein, especially lean meats, poultry, eggs, tofu, beans, or nuts. °Include low-fat dairy as part of your daily diet. °Lifestyle °Choose healthy options in all settings, including home, work, school, restaurants, or stores. °Prepare your food safely: °Wash your hands after handling raw meats. °Keep food preparation surfaces clean by regularly washing with hot, soapy water. °Keep raw meats separate from ready-to-eat foods, such as fruits and vegetables. °Cook seafood, meat, poultry, and eggs to the recommended internal temperature. °Store foods at safe temperatures. In general: °Keep cold foods at 40°F (4.4°C) or below. °Keep hot foods at 140°F (60°C) or above. °Keep your freezer at 0°F (-17.8°C) or below. °Foods are no longer safe to eat when they have been between the temperatures of 40°-140°F (4.4-60°C) for more than 2 hours. °What foods should I eat? °Fruits °Aim to eat 2 cup-equivalents of fresh, canned (in natural juice), or frozen fruits each day. Examples of 1 cup-equivalent of fruit include 1 small apple, 8 large strawberries, 1 cup canned fruit, ½ cup dried fruit, or 1 cup 100% juice. °Vegetables °Aim to eat 2½-3 cup-equivalents of fresh and frozen vegetables each day, including different varieties and colors. Examples of 1 cup-equivalent of vegetables include 2 medium carrots, 2 cups raw,   leafy greens, 1 cup chopped vegetable (raw or cooked), or 1 medium baked potato. °Grains °Aim to  eat 6 ounce-equivalents of whole grains each day. Examples of 1 ounce-equivalent of grains include 1 slice of bread, 1 cup ready-to-eat cereal, 3 cups popcorn, or ½ cup cooked rice, pasta, or cereal. °Meats and other proteins °Aim to eat 5-6 ounce-equivalents of protein each day. Examples of 1 ounce-equivalent of protein include 1 egg, 1/2 cup nuts or seeds, or 1 tablespoon (16 g) peanut butter. A cut of meat or fish that is the size of a deck of cards is about 3-4 ounce-equivalents. °Of the protein you eat each week, try to have at least 8 ounces come from seafood. This includes salmon, trout, herring, and anchovies. °Dairy °Aim to eat 3 cup-equivalents of fat-free or low-fat dairy each day. Examples of 1 cup-equivalent of dairy include 1 cup (240 mL) milk, 8 ounces (250 g) yogurt, 1½ ounces (44 g) natural cheese, or 1 cup (240 mL) fortified soy milk. °Fats and oils °Aim for about 5 teaspoons (21 g) per day. Choose monounsaturated fats, such as canola and olive oils, avocados, peanut butter, and most nuts, or polyunsaturated fats, such as sunflower, corn, and soybean oils, walnuts, pine nuts, sesame seeds, sunflower seeds, and flaxseed. °Beverages °Aim for six 8-oz glasses of water per day. Limit coffee to three to five 8-oz cups per day. °Limit caffeinated beverages that have added calories, such as soda and energy drinks. °Limit alcohol intake to no more than 1 drink a day for nonpregnant women and 2 drinks a day for men. One drink equals 12 oz of beer (355 mL), 5 oz of wine (148 mL), or 1½ oz of hard liquor (44 mL). °Seasoning and other foods °Avoid adding excess amounts of salt to your foods. Try flavoring foods with herbs and spices instead of salt. °Avoid adding sugar to foods. °Try using oil-based dressings, sauces, and spreads instead of solid fats. °This information is based on general [place] nutrition guidelines. For more information, visit choosemyplate.gov. Exact amounts may vary based on your nutrition  needs. °Summary °A healthy eating plan may help you to maintain a healthy weight, reduce the risk of chronic diseases, and stay active throughout your life. °Plan your meals. Make sure you eat the right portions of a variety of nutrient-rich foods. °Try baking, boiling, grilling, or broiling instead of frying. °Choose healthy options in all settings, including home, work, school, restaurants, or stores. °This information is not intended to replace advice given to you by your health care provider. Make sure you discuss any questions you have with your health care provider. °Document Revised: 08/15/2021 Document Reviewed: 08/15/2021 °Elsevier Patient Education © 2022 Elsevier Inc. ° °

## 2022-02-01 NOTE — Progress Notes (Signed)
° ° °Patient ID: Karen Tanner, female    DOB: 12/28/1971  MRN: [id number] ° °CC: Diabetes and Hypertension ° ° °Subjective: °Karen Tanner is a 50 y.o. female who presents for chronic ds management °Her concerns today include:  °Patient with history of HTN, diabetes, fibroid, HL, anemia, tob dep, chronic LBP had fusion surgery 2004, OA °  °DM:  °Results for orders placed or performed in visit on 02/01/22  °POCT glycosylated hemoglobin (Hb A1C)  °Result Value Ref Range  ° Hemoglobin A1C    ° HbA1c POC (<> result, manual entry)    ° HbA1c, POC (prediabetic range)    ° HbA1c, POC (controlled diabetic range) 7.5 (A) 0.0 - 7.0 %  °Supposed to be on metformin 500 mg daily.  However she admits that she does not take it every day because it makes her feel tired and drowsy.  She takes it about 3 to 4 days a week.  She feels she can do better with her eating habits.  She thinks A1c has increased because she has been drinking a lot on the weekends.  She drinks about 5-6 drinks per day on the weekends that can include liquor, tequila or wine. °-No device to check blood sugars.  She would like to get testing supplies. ° °HTN: Reports compliance with Norvasc.  She took already for today.  No device to check blood pressure.  She will limit salt in the foods. ° °HL: Reports taking and tolerating Lipitor 10 mg ° °OA knees: Followed by [facility].  She is on gabapentin prescribed by them.  They recently put her in for some physical therapy.  She tells me that they will be doing gel shots to her knees once approved by her insurance.  Still has chronic back pain.  Tells me she has been taking Naprosyn.  Actually supposed to be on meloxicam. °Patient Active Problem List  ° Diagnosis Date Noted  ° Intraductal papilloma 12/20/2021  ° Drinking binge 04/17/2021  ° Tobacco dependence 04/17/2021  ° Hyperlipidemia associated with type 2 diabetes mellitus ([facility]) 04/17/2021  ° Uterine leiomyoma 08/21/2018  ° Type 2 diabetes mellitus with  obesity ([facility]) 05/21/2016  ° History of back surgery 10/26/2014  ° History of anemia 10/26/2014  ° Hypertension associated with diabetes ([facility]) 10/26/2014  ° Chronic low back pain 10/26/2014  ° Menorrhagia with regular cycle 10/26/2014  °  ° °Current Outpatient Medications on File Prior to Visit  °Medication Sig Dispense Refill  ° gabapentin (NEURONTIN) 300 MG capsule Take by mouth.    ° methocarbamol (ROBAXIN) 500 MG tablet Take 1 tablet (500 mg total) by mouth 2 (two) times daily. 20 tablet 0  ° °No current facility-administered medications on file prior to visit.  ° ° °No Known Allergies ° °Social History  ° °Socioeconomic History  ° Marital status: Single  °  Spouse name: Not on file  ° Number of children: Not on file  ° Years of education: Not on file  ° Highest education level: Not on file  °Occupational History  ° Not on file  °Tobacco Use  ° Smoking status: Former  °  Packs/day: 0.25  °  Years: 14.00  °  Pack years: 3.50  °  Types: Cigarettes  ° Smokeless tobacco: Never  ° Tobacco comments:  °  Smoking 6-7 cigs per day  °Substance and Sexual Activity  ° Alcohol use: Yes  °  Alcohol/week: 0.0 standard drinks  ° Drug use: Not on file  ° Sexual   Sexual activity: Not on file  Other Topics Concern   Not on file  Social History Narrative   Not on file   Social Determinants of Health   Financial Resource Strain: Not on file  Food Insecurity: Not on file  Transportation Needs: Not on file  Physical Activity: Not on file  Stress: Not on file  Social Connections: Not on file  Intimate Partner Violence: Not on file    Family History  Problem Relation Age of Onset   Diabetes Mother    Hyperlipidemia Mother    Heart disease Mother    Diabetes Father    Diabetes Maternal Grandmother     Past Surgical History:  Procedure Laterality Date   BACK SURGERY     Bone fusion    ROS: Review of Systems Negative except as stated above  PHYSICAL EXAM: BP (!) 179/97    Pulse 68    Resp 16    Wt 260 lb 3.2 oz  (118 kg)    SpO2 98%    BMI 40.75 kg/m   Physical Exam  General appearance - alert, well appearing, middle-age African-American female, obese and in no distress Mental status - normal mood, behavior, speech, dress, motor activity, and thought processes Chest - clear to auscultation, no wheezes, rales or rhonchi, symmetric air entry Heart - normal rate, regular rhythm, normal S1, S2, no murmurs, rubs, clicks or gallops Extremities - peripheral pulses normal, no pedal edema, no clubbing or cyanosis   CMP Latest Ref Rng & Units 04/17/2021 08/21/2018 03/25/2018  Glucose 65 - 99 mg/dL 108(H) 117(H) 196(H)  BUN 6 - 24 mg/dL _0 Creatinine 0.57 - 1.00 mg/dL 0.92 0.72 0.70  Sodium 134 - 144 mmol/L 143 141 137  Potassium 3.5 - 5.2 mmol/L 4.4 4.0 3.4(L)  Chloride 96 - 106 mmol/L 102 102 104  CO2 20 - 29 mmol/L _1 Calcium 8.7 - 10.2 mg/dL 10.0 9.4 8.9  Total Protein 6.0 - 8.5 g/dL 7.4 7.0 7.0  Total Bilirubin 0.0 - 1.2 mg/dL 0.4 0.2 0.4  Alkaline Phos 44 - 121 IU/L 94 65 60  AST 0 - 40 IU/L _2 ALT 0 - 32 IU/L _3 Lipid Panel     Component Value Date/Time   CHOL 212 (H) 04/17/2021 1705   TRIG 101 04/17/2021 1705   HDL 73 04/17/2021 1705   CHOLHDL 2.9 04/17/2021 1705   CHOLHDL 3.2 01/23/2016 0956   VLDL 9 01/23/2016 0956   LDLCALC 121 (H) 04/17/2021 1705    CBC    Component Value Date/Time   WBC 6.1 04/17/2021 1705   WBC 16.4 (H) 03/25/2018 1520   RBC 5.07 04/17/2021 1705   RBC 4.68 03/25/2018 1520   HGB 14.1 04/17/2021 1705   HCT 43.6 04/17/2021 1705   PLT 292 04/17/2021 1705   MCV 86 04/17/2021 1705   MCH 27.8 04/17/2021 1705   MCH 22.2 (L) 03/25/2018 1520   MCHC 32.3 04/17/2021 1705   MCHC 31.0 03/25/2018 1520   RDW 13.7 04/17/2021 1705   LYMPHSABS 1.0 03/25/2018 1520   MONOABS 0.7 03/25/2018 1520   EOSABS 0.1 03/25/2018 1520   BASOSABS 0.0 03/25/2018 1520    ASSESSMENT AND PLAN:  1. Type 2 diabetes mellitus with morbid obesity ([facility]CC) Not at  goal. Discussed the importance of healthy eating habits, regular aerobic exercise (at least 150 minutes a week as tolerated) and medication compliance to achieve or maintain control of  diabetes. -Since she does not tolerate the metformin, I told her to stop taking it.  Recommend trying her with Ozempic or Trulicity that will also help with weight loss.  Patient declined stating she does not want injectables.  We will put her on a low-dose of Amaryl instead.  Went over signs and symptoms of hypoglycemia and how to treat it.  Prescription sent to her pharmacy for diabetic testing supplies.  - Microalbumin / creatinine urine ratio - POCT glycosylated hemoglobin (Hb A1C) - CBC; Future - Comprehensive metabolic panel; Future - Lipid panel; Future - glimepiride (AMARYL) 2 MG tablet; Take 0.5 tablets (1 mg total) by mouth daily before breakfast.  Dispense: 30 tablet; Refill: 3 - Amb ref to [facility] - blood glucose meter kit and supplies KIT; Dispense based on patient and insurance preference. Use once daily  Dispense: 1 each; Refill: 0  2. Essential hypertension Not at goal.  Continue amlodipine.  Add Diovan.  DASH diet discussed and encouraged. - valsartan (DIOVAN) 40 MG tablet; Take 1 tablet (40 mg total) by mouth daily.  Dispense: 90 tablet; Refill: 3 - amLODipine (NORVASC) 10 MG tablet; Take 1 tablet (10 mg total) by mouth daily.  Dispense: 90 tablet; Refill: 1  3. Tobacco dependence Advised to quit.  Patient states that she will cut back but not quite ready to quit.  Discussed health risks associated with smoking.  4. Hyperlipidemia associated with type 2 diabetes mellitus ([facility]) - atorvastatin (LIPITOR) 10 MG tablet; Take 1 tablet (10 mg total) by mouth daily.  Dispense: 90 tablet; Refill: 1  5. Primary osteoarthritis of right knee Stop Naprosyn.  I reminded her that she should have meloxicam.  Discussed the importance of weight loss to help take mechanical strain off the  knees. - meloxicam (MOBIC) 15 MG tablet; Take 1 tablet (15 mg total) by mouth daily.  Dispense: 30 tablet; Refill: 3  6. Drinking binge Discussed with her how much is too much alcohol for a female.  Advised that she should not drink more than 1 standard drink per day.  Discussed health risks associated with binge drinking.   Patient was given the opportunity to ask questions.  Patient verbalized understanding of the plan and was able to repeat key elements of the plan.   Orders Placed This Encounter  Procedures   Microalbumin / creatinine urine ratio   CBC   Comprehensive metabolic panel   Lipid panel   POCT glycosylated hemoglobin (Hb A1C)     Requested Prescriptions   Signed Prescriptions Disp Refills   glimepiride (AMARYL) 2 MG tablet 30 tablet 3    Sig: Take 0.5 tablets (1 mg total) by mouth daily before breakfast.   valsartan (DIOVAN) 40 MG tablet 90 tablet 3    Sig: Take 1 tablet (40 mg total) by mouth daily.   amLODipine (NORVASC) 10 MG tablet 90 tablet 1    Sig: Take 1 tablet (10 mg total) by mouth daily.   meloxicam (MOBIC) 15 MG tablet 30 tablet 3    Sig: Take 1 tablet (15 mg total) by mouth daily.   atorvastatin (LIPITOR) 10 MG tablet 90 tablet 1    Sig: Take 1 tablet (10 mg total) by mouth daily.    Return in about 4 months (around 06/01/2022).  Karle Plumber, MD, FACP

## 2022-02-05 DIAGNOSIS — M5451 Vertebrogenic low back pain: Secondary | ICD-10-CM | POA: Diagnosis not present

## 2022-02-05 DIAGNOSIS — M25561 Pain in right knee: Secondary | ICD-10-CM | POA: Diagnosis not present

## 2022-02-06 DIAGNOSIS — M5451 Vertebrogenic low back pain: Secondary | ICD-10-CM | POA: Diagnosis not present

## 2022-02-07 ENCOUNTER — Ambulatory Visit: Payer: Self-pay | Admitting: General Surgery

## 2022-02-07 DIAGNOSIS — D242 Benign neoplasm of left breast: Secondary | ICD-10-CM | POA: Diagnosis not present

## 2022-02-08 ENCOUNTER — Other Ambulatory Visit: Payer: Medicare HMO

## 2022-02-12 ENCOUNTER — Other Ambulatory Visit: Payer: Self-pay | Admitting: General Surgery

## 2022-02-12 DIAGNOSIS — D242 Benign neoplasm of left breast: Secondary | ICD-10-CM

## 2022-02-15 DIAGNOSIS — M1711 Unilateral primary osteoarthritis, right knee: Secondary | ICD-10-CM | POA: Diagnosis not present

## 2022-02-28 ENCOUNTER — Other Ambulatory Visit: Payer: Self-pay

## 2022-02-28 ENCOUNTER — Encounter (HOSPITAL_BASED_OUTPATIENT_CLINIC_OR_DEPARTMENT_OTHER): Payer: Self-pay | Admitting: General Surgery

## 2022-03-01 DIAGNOSIS — M1711 Unilateral primary osteoarthritis, right knee: Secondary | ICD-10-CM | POA: Diagnosis not present

## 2022-03-01 DIAGNOSIS — M25562 Pain in left knee: Secondary | ICD-10-CM | POA: Diagnosis not present

## 2022-03-07 DIAGNOSIS — M1711 Unilateral primary osteoarthritis, right knee: Secondary | ICD-10-CM | POA: Diagnosis not present

## 2022-03-08 ENCOUNTER — Ambulatory Visit
Admission: RE | Admit: 2022-03-08 | Discharge: 2022-03-08 | Disposition: A | Discharge status: Home / Self Care | Payer: Medicare HMO | Source: Ambulatory Visit | Attending: General Surgery | Admitting: General Surgery

## 2022-03-08 ENCOUNTER — Other Ambulatory Visit: Payer: Self-pay

## 2022-03-08 DIAGNOSIS — R928 Other abnormal and inconclusive findings on diagnostic imaging of breast: Secondary | ICD-10-CM | POA: Diagnosis not present

## 2022-03-08 DIAGNOSIS — D242 Benign neoplasm of left breast: Secondary | ICD-10-CM

## 2022-03-09 ENCOUNTER — Ambulatory Visit
Admission: RE | Admit: 2022-03-09 | Discharge: 2022-03-09 | Disposition: A | Discharge status: Home / Self Care | Payer: Medicare HMO | Source: Ambulatory Visit | Attending: General Surgery | Admitting: General Surgery

## 2022-03-09 ENCOUNTER — Other Ambulatory Visit: Payer: Self-pay

## 2022-03-09 ENCOUNTER — Ambulatory Visit (HOSPITAL_BASED_OUTPATIENT_CLINIC_OR_DEPARTMENT_OTHER): Payer: Medicare HMO | Admitting: Anesthesiology

## 2022-03-09 ENCOUNTER — Encounter (HOSPITAL_BASED_OUTPATIENT_CLINIC_OR_DEPARTMENT_OTHER)
Admission: RE | Disposition: A | Discharge status: Home / Self Care | Payer: Self-pay | Source: Home / Self Care | Attending: General Surgery

## 2022-03-09 ENCOUNTER — Ambulatory Visit (HOSPITAL_BASED_OUTPATIENT_CLINIC_OR_DEPARTMENT_OTHER)
Admission: RE | Admit: 2022-03-09 | Discharge: 2022-03-09 | Disposition: A | Discharge status: Home / Self Care | Payer: Medicare HMO | Attending: General Surgery | Admitting: General Surgery

## 2022-03-09 ENCOUNTER — Encounter (HOSPITAL_BASED_OUTPATIENT_CLINIC_OR_DEPARTMENT_OTHER): Payer: Self-pay | Admitting: General Surgery

## 2022-03-09 DIAGNOSIS — D242 Benign neoplasm of left breast: Secondary | ICD-10-CM | POA: Insufficient documentation

## 2022-03-09 DIAGNOSIS — Z6841 Body Mass Index (BMI) 40.0 and over, adult: Secondary | ICD-10-CM | POA: Insufficient documentation

## 2022-03-09 DIAGNOSIS — E119 Type 2 diabetes mellitus without complications: Secondary | ICD-10-CM | POA: Diagnosis not present

## 2022-03-09 DIAGNOSIS — I10 Essential (primary) hypertension: Secondary | ICD-10-CM

## 2022-03-09 DIAGNOSIS — F17210 Nicotine dependence, cigarettes, uncomplicated: Secondary | ICD-10-CM | POA: Diagnosis not present

## 2022-03-09 DIAGNOSIS — E1169 Type 2 diabetes mellitus with other specified complication: Secondary | ICD-10-CM

## 2022-03-09 DIAGNOSIS — R921 Mammographic calcification found on diagnostic imaging of breast: Secondary | ICD-10-CM | POA: Diagnosis not present

## 2022-03-09 DIAGNOSIS — N6489 Other specified disorders of breast: Secondary | ICD-10-CM | POA: Diagnosis not present

## 2022-03-09 DIAGNOSIS — R928 Other abnormal and inconclusive findings on diagnostic imaging of breast: Secondary | ICD-10-CM | POA: Diagnosis not present

## 2022-03-09 DIAGNOSIS — M1990 Unspecified osteoarthritis, unspecified site: Secondary | ICD-10-CM | POA: Insufficient documentation

## 2022-03-09 DIAGNOSIS — Z7984 Long term (current) use of oral hypoglycemic drugs: Secondary | ICD-10-CM

## 2022-03-09 HISTORY — DX: Unspecified osteoarthritis, unspecified site: M19.90

## 2022-03-09 HISTORY — PX: BREAST LUMPECTOMY: SHX2

## 2022-03-09 HISTORY — PX: BREAST LUMPECTOMY WITH RADIOACTIVE SEED LOCALIZATION: SHX6424

## 2022-03-09 LAB — BASIC METABOLIC PANEL
Anion gap: 8 (ref 5–15)
BUN: 11 mg/dL (ref 6–20)
CO2: 30 mmol/L (ref 22–32)
Calcium: 9.4 mg/dL (ref 8.9–10.3)
Chloride: 102 mmol/L (ref 98–111)
Creatinine, Ser: 0.71 mg/dL (ref 0.44–1.00)
GFR, Estimated: >60 mL/min (ref >60)
Glucose, Bld: 136 mg/dL — ABNORMAL HIGH (ref 70–99)
Potassium: 4.2 mmol/L (ref 3.5–5.1)
Sodium: 140 mmol/L (ref 135–145)

## 2022-03-09 LAB — GLUCOSE, CAPILLARY
Glucose-Capillary: 132 mg/dL — ABNORMAL HIGH (ref 70–99)
Glucose-Capillary: 126 mg/dL — ABNORMAL HIGH (ref 70–99)

## 2022-03-09 SURGERY — BREAST LUMPECTOMY WITH RADIOACTIVE SEED LOCALIZATION
Anesthesia: General | Site: Breast | Laterality: Left

## 2022-03-09 MED ORDER — DEXAMETHASONE SODIUM PHOSPHATE 10 MG/ML IJ SOLN
INTRAMUSCULAR | Status: AC
  Filled 2022-03-09: qty 1

## 2022-03-09 MED ORDER — CEFAZOLIN SODIUM 1 G IJ SOLR
INTRAMUSCULAR | Status: AC
  Filled 2022-03-09: qty 10

## 2022-03-09 MED ORDER — CHLORHEXIDINE GLUCONATE CLOTH 2 % EX PADS: 6.0000 | MEDICATED_PAD | Freq: Once | CUTANEOUS | Status: DC

## 2022-03-09 MED ORDER — CELECOXIB 200 MG PO CAPS
ORAL_CAPSULE | ORAL | Status: AC
  Filled 2022-03-09: qty 1

## 2022-03-09 MED ORDER — ONDANSETRON HCL 4 MG/2ML IJ SOLN
INTRAMUSCULAR | Status: AC
Start: 2022-03-09 — End: ?
  Filled 2022-03-09: qty 2

## 2022-03-09 MED ORDER — CEFAZOLIN SODIUM-DEXTROSE 2-4 GM/100ML-% IV SOLN
INTRAVENOUS | Status: AC
  Filled 2022-03-09: qty 100

## 2022-03-09 MED ORDER — MIDAZOLAM HCL 5 MG/5ML IJ SOLN
INTRAMUSCULAR | Status: DC | PRN
  Administered 2022-03-09: 2 mg via INTRAVENOUS

## 2022-03-09 MED ORDER — FENTANYL CITRATE (PF) 100 MCG/2ML IJ SOLN
INTRAMUSCULAR | Status: AC
Start: 2022-03-09 — End: ?
  Filled 2022-03-09: qty 2

## 2022-03-09 MED ORDER — PROPOFOL 10 MG/ML IV BOLUS
INTRAVENOUS | Status: AC
  Filled 2022-03-09: qty 20

## 2022-03-09 MED ORDER — FENTANYL CITRATE (PF) 100 MCG/2ML IJ SOLN
INTRAMUSCULAR | Status: DC | PRN
  Administered 2022-03-09: 50 ug via INTRAVENOUS

## 2022-03-09 MED ORDER — FENTANYL CITRATE (PF) 100 MCG/2ML IJ SOLN: 25.0000 ug | INTRAMUSCULAR | Status: DC | PRN

## 2022-03-09 MED ORDER — LACTATED RINGERS IV SOLN: INTRAVENOUS | Status: DC

## 2022-03-09 MED ORDER — GABAPENTIN 300 MG PO CAPS
ORAL_CAPSULE | ORAL | Status: AC
Start: 2022-03-09 — End: ?
  Filled 2022-03-09: qty 1

## 2022-03-09 MED ORDER — OXYCODONE HCL 5 MG PO TABS
5.0000 mg | ORAL_TABLET | Freq: Once | ORAL | Status: AC
  Administered 2022-03-09: 5 mg via ORAL

## 2022-03-09 MED ORDER — LIDOCAINE 2% (20 MG/ML) 5 ML SYRINGE
INTRAMUSCULAR | Status: DC | PRN
  Administered 2022-03-09: 60 mg via INTRAVENOUS

## 2022-03-09 MED ORDER — GABAPENTIN 300 MG PO CAPS
300.0000 mg | ORAL_CAPSULE | ORAL | Status: AC
  Administered 2022-03-09: 300 mg via ORAL

## 2022-03-09 MED ORDER — DEXAMETHASONE SODIUM PHOSPHATE 4 MG/ML IJ SOLN
INTRAMUSCULAR | Status: DC | PRN
  Administered 2022-03-09: 5 mg via INTRAVENOUS

## 2022-03-09 MED ORDER — PROPOFOL 10 MG/ML IV BOLUS
INTRAVENOUS | Status: DC | PRN
  Administered 2022-03-09: 200 mg via INTRAVENOUS

## 2022-03-09 MED ORDER — BUPIVACAINE-EPINEPHRINE (PF) 0.25% -1:200000 IJ SOLN
INTRAMUSCULAR | Status: DC | PRN
  Administered 2022-03-09: 20 mL

## 2022-03-09 MED ORDER — CEFAZOLIN SODIUM-DEXTROSE 2-4 GM/100ML-% IV SOLN
2.0000 g | INTRAVENOUS | Status: AC
  Administered 2022-03-09: 3 g via INTRAVENOUS

## 2022-03-09 MED ORDER — MIDAZOLAM HCL 2 MG/2ML IJ SOLN
INTRAMUSCULAR | Status: AC
  Filled 2022-03-09: qty 2

## 2022-03-09 MED ORDER — CELECOXIB 200 MG PO CAPS
200.0000 mg | ORAL_CAPSULE | ORAL | Status: AC
  Administered 2022-03-09: 200 mg via ORAL

## 2022-03-09 MED ORDER — ACETAMINOPHEN 500 MG PO TABS
ORAL_TABLET | ORAL | Status: AC
  Filled 2022-03-09: qty 2

## 2022-03-09 MED ORDER — LIDOCAINE 2% (20 MG/ML) 5 ML SYRINGE
INTRAMUSCULAR | Status: AC
  Filled 2022-03-09: qty 5

## 2022-03-09 MED ORDER — OXYCODONE HCL 5 MG PO TABS: 5.0000 mg | ORAL_TABLET | Freq: Four times a day (QID) | ORAL | 0 refills | Status: DC | PRN

## 2022-03-09 MED ORDER — ACETAMINOPHEN 500 MG PO TABS
1000.0000 mg | ORAL_TABLET | ORAL | Status: AC
  Administered 2022-03-09: 1000 mg via ORAL

## 2022-03-09 MED ORDER — OXYCODONE HCL 5 MG PO TABS
ORAL_TABLET | ORAL | Status: AC
  Filled 2022-03-09: qty 1

## 2022-03-09 MED ORDER — ONDANSETRON HCL 4 MG/2ML IJ SOLN
INTRAMUSCULAR | Status: DC | PRN
  Administered 2022-03-09: 4 mg via INTRAVENOUS

## 2022-03-09 SURGICAL SUPPLY — 47 items
ADH SKN CLS APL DERMABOND .7 (GAUZE/BANDAGES/DRESSINGS) ×1
APL PRP STRL LF DISP 70% ISPRP (MISCELLANEOUS) ×1
APPLIER CLIP 9.375 MED OPEN (MISCELLANEOUS)
APR CLP MED 9.3 20 MLT OPN (MISCELLANEOUS)
BLADE SURG 15 STRL LF DISP TIS (BLADE) ×1 IMPLANT
BLADE SURG 15 STRL SS (BLADE) ×3
CANISTER SUC SOCK COL 7IN (MISCELLANEOUS) ×2 IMPLANT
CANISTER SUCT 1200ML W/VALVE (MISCELLANEOUS) ×1 IMPLANT
CHLORAPREP W/TINT 26 (MISCELLANEOUS) ×3 IMPLANT
CLIP APPLIE 9.375 MED OPEN (MISCELLANEOUS) IMPLANT
COVER BACK TABLE 60X90IN (DRAPES) ×4 IMPLANT
COVER MAYO STAND STRL (DRAPES) ×3 IMPLANT
COVER PROBE W GEL 5X96 (DRAPES) ×3 IMPLANT
DERMABOND ADVANCED (GAUZE/BANDAGES/DRESSINGS) ×2
DERMABOND ADVANCED .7 DNX12 (GAUZE/BANDAGES/DRESSINGS) ×2 IMPLANT
DRAPE LAPAROSCOPIC ABDOMINAL (DRAPES) ×4 IMPLANT
DRAPE UTILITY XL STRL (DRAPES) ×4 IMPLANT
ELECT COATED BLADE 2.86 ST (ELECTRODE) ×4 IMPLANT
ELECT REM PT RETURN 9FT ADLT (ELECTROSURGICAL) ×3
ELECTRODE REM PT RTRN 9FT ADLT (ELECTROSURGICAL) ×2 IMPLANT
GLOVE SURG ENC MOIS LTX SZ6.5 (GLOVE) ×2 IMPLANT
GLOVE SURG ENC MOIS LTX SZ7.5 (GLOVE) ×6 IMPLANT
GLOVE SURG UNDER POLY LF SZ7 (GLOVE) ×4 IMPLANT
GOWN STRL REUS W/ TWL LRG LVL3 (GOWN DISPOSABLE) ×2 IMPLANT
GOWN STRL REUS W/ TWL XL LVL3 (GOWN DISPOSABLE) IMPLANT
GOWN STRL REUS W/TWL LRG LVL3 (GOWN DISPOSABLE) ×3
GOWN STRL REUS W/TWL XL LVL3 (GOWN DISPOSABLE) ×3
ILLUMINATOR WAVEGUIDE N/F (MISCELLANEOUS) IMPLANT
KIT MARKER MARGIN INK (KITS) ×4 IMPLANT
LIGHT WAVEGUIDE WIDE FLAT (MISCELLANEOUS) IMPLANT
NDL HYPO 25X1 1.5 SAFETY (NEEDLE) IMPLANT
NEEDLE HYPO 25X1 1.5 SAFETY (NEEDLE) ×3 IMPLANT
NS IRRIG 1000ML POUR BTL (IV SOLUTION) IMPLANT
PACK BASIN DAY SURGERY FS (CUSTOM PROCEDURE TRAY) ×4 IMPLANT
PENCIL SMOKE EVACUATOR (MISCELLANEOUS) ×6 IMPLANT
SLEEVE SCD COMPRESS KNEE MED (STOCKING) ×3 IMPLANT
SPIKE FLUID TRANSFER (MISCELLANEOUS) IMPLANT
SPONGE T-LAP 18X18 ~~LOC~~+RFID (SPONGE) ×4 IMPLANT
SUT MON AB 4-0 PC3 18 (SUTURE) ×3 IMPLANT
SUT SILK 2 0 SH (SUTURE) IMPLANT
SUT VICRYL 3-0 CR8 SH (SUTURE) ×3 IMPLANT
SYR CONTROL 10ML LL (SYRINGE) ×2 IMPLANT
TOWEL GREEN STERILE FF (TOWEL DISPOSABLE) ×4 IMPLANT
TRAY FAXITRON CT DISP (TRAY / TRAY PROCEDURE) ×3 IMPLANT
TUBE CONNECTING 20'X1/4 (TUBING)
TUBE CONNECTING 20X1/4 (TUBING) ×1 IMPLANT
YANKAUER SUCT BULB TIP NO VENT (SUCTIONS) IMPLANT

## 2022-03-09 NOTE — Anesthesia Procedure Notes (Signed)
Procedure Name: LMA Insertion ?Date/Time: 03/09/2022 9:36 AM ?Performed by: Ezequiel Kayser, CRNA ?Pre-anesthesia Checklist: Patient identified, Emergency Drugs available, Suction available and Patient being monitored ?Patient Re-evaluated:Patient Re-evaluated prior to induction ?Oxygen Delivery Method: Circle System Utilized ?Preoxygenation: Pre-oxygenation with 100% oxygen ?Induction Type: IV induction ?Ventilation: Mask ventilation without difficulty ?LMA: LMA inserted ?LMA Size: 4.0 ?Number of attempts: 1 ?Airway Equipment and Method: Bite block ?Placement Confirmation: positive ETCO2 ?Tube secured with: Tape ?Dental Injury: Teeth and Oropharynx as per pre-operative assessment  ? ? ? ? ?

## 2022-03-09 NOTE — Anesthesia Postprocedure Evaluation (Signed)
Anesthesia Post Note ? ?Patient: Karen Tanner ? ?Procedure(s) Performed: LEFT BREAST LUMPECTOMY WITH RADIOACTIVE SEED LOCALIZATION (Left: Breast) ? ?  ? ?Patient location during evaluation: PACU ?Anesthesia Type: General ?Level of consciousness: awake and alert ?Pain management: pain level controlled ?Vital Signs Assessment: post-procedure vital signs reviewed and stable ?Respiratory status: spontaneous breathing, nonlabored ventilation and respiratory function stable ?Cardiovascular status: blood pressure returned to baseline and stable ?Postop Assessment: no apparent nausea or vomiting ?Anesthetic complications: no ? ? ?No notable events documented. ? ?Last Vitals:  ?Vitals:  ? 03/09/22 1030 03/09/22 1042  ?BP: (!) 155/92 (!) 162/90  ?Pulse: (!) 54 (!) 58  ?Resp: 11   ?Temp:  36.6 ?C  ?SpO2: 100% 96%  ?  ?Last Pain:  ?Vitals:  ? 03/09/22 1055  ?TempSrc:   ?PainSc: 6   ? ? ?  ?  ?  ?  ?  ?  ? ?Davona Kinoshita,W. EDMOND ? ? ? ? ?

## 2022-03-09 NOTE — Anesthesia Preprocedure Evaluation (Addendum)
Anesthesia Evaluation  ?Patient identified by MRN, date of birth, ID band ?Patient awake ? ? ? ?Reviewed: ?Allergy & Precautions, H&P , NPO status , Patient's Chart, lab work & pertinent test results ? ?Airway ?Mallampati: II ? ?TM Distance: >3 FB ?Neck ROM: Full ? ? ? Dental ?no notable dental hx. ?(+) Teeth Intact, Dental Advisory Given ?  ?Pulmonary ?neg pulmonary ROS, Current Smoker and Patient abstained from smoking.,  ?  ?Pulmonary exam normal ?breath sounds clear to auscultation ? ? ? ? ? ? Cardiovascular ?hypertension, Pt. on medications ? ?Rhythm:Regular Rate:Normal ? ? ?  ?Neuro/Psych ?negative neurological ROS ? negative psych ROS  ? GI/Hepatic ?negative GI ROS, Neg liver ROS,   ?Endo/Other  ?diabetes, Type 2, Oral Hypoglycemic AgentsMorbid obesity ? Renal/GU ?negative Renal ROS  ?negative genitourinary ?  ?Musculoskeletal ? ?(+) Arthritis , Osteoarthritis,   ? Abdominal ?  ?Peds ? Hematology ? ?(+) Blood dyscrasia, anemia ,   ?Anesthesia Other Findings ? ? Reproductive/Obstetrics ?negative OB ROS ? ?  ? ? ? ? ? ? ? ? ? ? ? ? ? ?  ?  ? ? ? ? ? ? ? ?Anesthesia Physical ?Anesthesia Plan ? ?ASA: 3 ? ?Anesthesia Plan: General  ? ?Post-op Pain Management: Tylenol PO (pre-op)*  ? ?Induction: Intravenous ? ?PONV Risk Score and Plan: 3 and Ondansetron, Dexamethasone and Midazolam ? ?Airway Management Planned: LMA ? ?Additional Equipment:  ? ?Intra-op Plan:  ? ?Post-operative Plan: Extubation in OR ? ?Informed Consent: I have reviewed the patients History and Physical, chart, labs and discussed the procedure including the risks, benefits and alternatives for the proposed anesthesia with the patient or authorized representative who has indicated his/her understanding and acceptance.  ? ? ? ?Dental advisory given ? ?Plan Discussed with: CRNA ? ?Anesthesia Plan Comments:   ? ? ? ? ? ? ?Anesthesia Quick Evaluation ? ?

## 2022-03-09 NOTE — Discharge Instructions (Signed)
?  No Tylenol or ibuprofen until 2pm ? ?Post Anesthesia Home Care Instructions ? ?Activity: ?Get plenty of rest for the remainder of the day. A responsible individual must stay with you for 24 hours following the procedure.  ?For the next 24 hours, DO NOT: ?-Drive a car ?-Paediatric nurse ?-Drink alcoholic beverages ?-Take any medication unless instructed by your physician ?-Make any legal decisions or sign important papers. ? ?Meals: ?Start with liquid foods such as gelatin or soup. Progress to regular foods as tolerated. Avoid greasy, spicy, heavy foods. If nausea and/or vomiting occur, drink only clear liquids until the nausea and/or vomiting subsides. Call your physician if vomiting continues. ? ?Special Instructions/Symptoms: ?Your throat may feel dry or sore from the anesthesia or the breathing tube placed in your throat during surgery. If this causes discomfort, gargle with warm salt water. The discomfort should disappear within 24 hours. ? ?If you had a scopolamine patch placed behind your ear for the management of post- operative nausea and/or vomiting: ? ?1. The medication in the patch is effective for 72 hours, after which it should be removed.  Wrap patch in a tissue and discard in the trash. Wash hands thoroughly with soap and water. ?2. You may remove the patch earlier than 72 hours if you experience unpleasant side effects which may include dry mouth, dizziness or visual disturbances. ?3. Avoid touching the patch. Wash your hands with soap and water after contact with the patch. ?    ?

## 2022-03-09 NOTE — Interval H&P Note (Signed)
History and Physical Interval Note: ? ?03/09/2022 ?9:19 AM ? ?Karen Tanner  has presented today for surgery, with the diagnosis of LEFT BREAST INTRADUCTAL PAPILLOMA.  The various methods of treatment have been discussed with the patient and family. After consideration of risks, benefits and other options for treatment, the patient has consented to  Procedure(s): ?LEFT BREAST LUMPECTOMY WITH RADIOACTIVE SEED LOCALIZATION (Left) as a surgical intervention.  The patient's history has been reviewed, patient examined, no change in status, stable for surgery.  I have reviewed the patient's chart and labs.  Questions were answered to the patient's satisfaction.   ? ? ?Autumn Messing III ? ? ?

## 2022-03-09 NOTE — H&P (Signed)
?PROVIDER: Landry Corporal, MD ? ?MRN: [id number] ?DOB: [birth year] ?Subjective  ? ?Chief Complaint: Breast Mass ? ? ?History of Present Illness: ?Karen Tanner is a 51 y.o. female who is seen today as an office consultation at the request of Dr. Pasty Arch for evaluation of Breast Mass ?.  ? ?We are asked to see the patient in consultation by Dr. Karle Plumber to evaluate her for a intraductal papilloma of the left breast. The patient is a 51 year old black female who recently went for a routine screening mammogram. At that time she was found to have a 3 cm area of calcification that was abnormal in the right breast and a 1.2 cm area of calcification that was abnormal in the subareolar left breast. Both of these areas were biopsied. The right breast came back benign. The left breast came back as an intraductal papilloma with excision recommended. She does smoke about half a pack of cigarettes a day. She has no family history of breast cancer. ? ?Review of Systems: ?A complete review of systems was obtained from the patient. I have reviewed this information and discussed as appropriate with the patient. See HPI as well for other ROS. ? ?ROS  ? ?Medical History: ?Past Medical History:  ?Diagnosis Date  ? Arthritis  ? Diabetes mellitus without complication (CMS-HCC)  ? ?Patient Active Problem List  ?Diagnosis  ? Intraductal papilloma of breast, left  ? ?Past Surgical History:  ?Procedure Laterality Date  ? POSTERIOR FUSION CLIVUS-C2 TRANSARTICULAR STEALTH GUIDED W/ [facility]  ? ? ?No Known Allergies ? ?Current Outpatient Medications on File Prior to Visit  ?Medication Sig Dispense Refill  ? amLODIPine (NORVASC) 10 MG tablet  ? atorvastatin (LIPITOR) 10 MG tablet Take 10 mg by mouth once daily  ? gabapentin (NEURONTIN) 300 MG capsule  ? meloxicam (MOBIC) 15 MG tablet Take 15 mg by mouth once daily  ? ?No current facility-administered medications on file prior to visit.  ? ?Family History  ?Problem Relation Age of Onset  ? High  blood pressure (Hypertension) Mother  ? Diabetes Mother  ? ? ?Social History  ? ?Tobacco Use  ?Smoking Status Every Day  ? Types: Cigarettes  ?Smokeless Tobacco Never  ? ? ?Social History  ? ?Socioeconomic History  ? Marital status: Single  ?Tobacco Use  ? Smoking status: Every Day  ?Types: Cigarettes  ? Smokeless tobacco: Never  ? ?Objective:  ? ?Vitals:  ?BP: (!) 142/88  ?Pulse: 81  ?Weight: (!) 117.8 kg (259 lb 12.8 oz)  ?Height: 171.5 cm (5' 7.5")  ? ?Body mass index is 40.09 kg/m?. ? ?Physical Exam ?Vitals reviewed.  ?Constitutional:  ?General: She is not in acute distress. ?Appearance: Normal appearance.  ?HENT:  ?Head: Normocephalic and atraumatic.  ?Right Ear: External ear normal.  ?Left Ear: External ear normal.  ?Nose: Nose normal.  ?Mouth/Throat:  ?Mouth: Mucous membranes are moist.  ?Pharynx: Oropharynx is clear.  ?Eyes:  ?General: No scleral icterus. ?Extraocular Movements: Extraocular movements intact.  ?Conjunctiva/sclera: Conjunctivae normal.  ?Pupils: Pupils are equal, round, and reactive to light.  ?Cardiovascular:  ?Rate and Rhythm: Normal rate and regular rhythm.  ?Pulses: Normal pulses.  ?Heart sounds: Normal heart sounds.  ?Pulmonary:  ?Effort: Pulmonary effort is normal. No respiratory distress.  ?Breath sounds: Normal breath sounds.  ?Abdominal:  ?General: Bowel sounds are normal.  ?Palpations: Abdomen is soft.  ?Tenderness: There is no abdominal tenderness.  ?Musculoskeletal:  ?General: No swelling, tenderness or deformity. Normal range of motion.  ?Cervical back: Normal range of  motion and neck supple.  ?Skin: ?General: Skin is warm and dry.  ?Coloration: Skin is not jaundiced.  ?Neurological:  ?General: No focal deficit present.  ?Mental Status: She is alert and oriented to person, place, and time.  ?Psychiatric:  ?Mood and Affect: Mood normal.  ?Behavior: Behavior normal.  ? ? ? ?Breast: There is no palpable mass in either breast. There is no palpable axillary, supraclavicular, or  cervical lymphadenopathy. ? ?Labs, Imaging and Diagnostic Testing: ? ?Assessment and Plan:  ? ?Diagnoses and all orders for this visit: ? ?Intraductal papilloma of breast, left ?- [facility] Case Posting Request; Future ? ? ? ?The patient appears to have a 1.2 cm area of intraductal papilloma in the subareolar left breast. Because of its size and abnormal appearance there is about a 5 to 10% chance of missing something more significant. For this reason excision is recommended for this area. I have discussed with her in detail the risks and benefits of the operation as well as some of the technical aspects including use of a radioactive seed for localization and she understands and wishes to proceed. ?

## 2022-03-09 NOTE — Transfer of Care (Signed)
Immediate Anesthesia Transfer of Care Note ? ?Patient: Karen Tanner ? ?Procedure(s) Performed: LEFT BREAST LUMPECTOMY WITH RADIOACTIVE SEED LOCALIZATION (Left: Breast) ? ?Patient Location: PACU ? ?Anesthesia Type:General ? ?Level of Consciousness: drowsy ? ?Airway & Oxygen Therapy: Patient Spontanous Breathing and Patient connected to face mask oxygen ? ?Post-op Assessment: Report given to RN and Post -op Vital signs reviewed and stable ? ?Post vital signs: Reviewed and stable ? ?Last Vitals:  ?Vitals Value Taken Time  ?BP    ?Temp    ?Pulse 54 03/09/22 1015  ?Resp    ?SpO2 100 % 03/09/22 1015  ? ? ?Last Pain:  ?Vitals:  ? 03/09/22 0752  ?TempSrc: Oral  ?PainSc: 0-No pain  ?   ? ?Patients Stated Pain Goal: 3 (03/09/22 9678) ? ?Complications: No notable events documented. ?

## 2022-03-09 NOTE — Op Note (Signed)
03/09/2022 ? ?10:09 AM ? ?PATIENT:  Karen Tanner  51 y.o. female ? ?PRE-OPERATIVE DIAGNOSIS:  LEFT BREAST INTRADUCTAL PAPILLOMA ? ?POST-OPERATIVE DIAGNOSIS:  LEFT BREAST INTRADUCTAL PAPILLOMA ? ?PROCEDURE:  Procedure(s): ?LEFT BREAST LUMPECTOMY WITH RADIOACTIVE SEED LOCALIZATION (Left) ? ?SURGEON:  Surgeon(s) and Role: ?   Jovita Kussmaul, MD - Primary ? ?PHYSICIAN ASSISTANT:  ? ?ASSISTANTS: none  ? ?ANESTHESIA:   local and general ? ?EBL:  2 mL  ? ?BLOOD ADMINISTERED:none ? ?DRAINS: none  ? ?LOCAL MEDICATIONS USED:  MARCAINE    ? ?SPECIMEN:  Source of Specimen:  left breast tissue ? ?DISPOSITION OF SPECIMEN:  PATHOLOGY ? ?COUNTS:  YES ? ?TOURNIQUET:  * No tourniquets in log * ? ?DICTATION: .Dragon Dictation ? ?After informed consent was obtained the patient was brought to the operating room and placed in the supine position on the operating table.  After adequate induction of general anesthesia the patient's left breast was prepped with ChloraPrep, allowed to dry, and draped in usual sterile manner.  An appropriate timeout was performed.  Previously an I-125 seed was placed in the inner subareolar left breast to mark an area of intraductal papilloma.  The neoprobe was set to I-125 in the area of radioactivity was readily identified.  The area around this was infiltrated with quarter percent Marcaine.  A curvilinear incision was made along the inner aspect of the areola of the left breast with a 15 blade knife.  The incision was carried through the skin and subcutaneous tissue sharply with the electrocautery.  Dissection was then carried out in the inner subareolar area.  Once I identified the area of the radioactive seed I then removed a circular portion of breast tissue sharply with the electrocautery around the radioactive seed while checking the area of radioactivity frequently.  Once the specimen was removed it was oriented with the appropriate paint colors.  A specimen radiograph was obtained that showed  the clip and seed to be within the specimen.  The specimen was then sent to pathology for further evaluation.  Hemostasis was achieved using the Bovie electrocautery.  The wound was irrigated with saline and infiltrated with more quarter percent Marcaine.  The deep layer of the incision was then closed with layers of interrupted 3-0 Vicryl stitches.  The skin was then closed with interrupted 4-0 Monocryl subcuticular stitches.  Dermabond dressings were applied.  The patient tolerated the procedure well.  At the end of the case all needle sponge and instrument counts were correct.  The patient was then awakened and taken recovery in stable condition. ? ?PLAN OF CARE: Discharge to home after PACU ? ?PATIENT DISPOSITION:  PACU - hemodynamically stable. ?  ?Delay start of Pharmacological VTE agent (>24hrs) due to surgical blood loss or risk of bleeding: not applicable ? ?

## 2022-03-12 ENCOUNTER — Encounter (HOSPITAL_BASED_OUTPATIENT_CLINIC_OR_DEPARTMENT_OTHER): Payer: Self-pay | Admitting: General Surgery

## 2022-03-12 ENCOUNTER — Other Ambulatory Visit: Payer: Medicare HMO

## 2022-03-12 LAB — SURGICAL PATHOLOGY

## 2022-03-15 ENCOUNTER — Telehealth: Payer: Self-pay | Admitting: Internal Medicine

## 2022-03-15 NOTE — Telephone Encounter (Signed)
PC placed to pt this afternoon.  Informed pt that I received the pathology results of the lump removed from the LT breast.  It was a papilloma without cancerous cells.  However, I told her that having had a papilloma increases her risk for breast cancer more so that a woman who does not have a papilloma.  Therefore I would like to refer to oncology to see if she needs chemoprevention therapy.  I explained to her what this is.  Pt agreeable to referral. ?

## 2022-03-16 ENCOUNTER — Ambulatory Visit: Payer: Medicare HMO | Admitting: Dietician

## 2022-03-19 ENCOUNTER — Telehealth: Payer: Self-pay | Admitting: Hematology and Oncology

## 2022-03-19 NOTE — Telephone Encounter (Signed)
Scheduled appt per 3/16 referral. Pt is aware of appt date and time. Pt is aware to arrive 15 mins prior to appt time and to bring and updated insurance card. Pt is aware of appt location.   ?

## 2022-03-23 DIAGNOSIS — M5459 Other low back pain: Secondary | ICD-10-CM | POA: Diagnosis not present

## 2022-03-27 ENCOUNTER — Other Ambulatory Visit: Payer: Medicare HMO

## 2022-03-28 ENCOUNTER — Ambulatory Visit: Payer: Medicare HMO | Attending: Family Medicine

## 2022-03-28 ENCOUNTER — Telehealth: Payer: Self-pay | Admitting: Internal Medicine

## 2022-03-28 DIAGNOSIS — E1169 Type 2 diabetes mellitus with other specified complication: Secondary | ICD-10-CM

## 2022-03-28 NOTE — Telephone Encounter (Signed)
Pt is requesting rx for test strips ? ?Pt preferred pharm is [facility] [address] ?

## 2022-03-29 ENCOUNTER — Other Ambulatory Visit: Payer: Self-pay | Admitting: Internal Medicine

## 2022-03-29 DIAGNOSIS — E1169 Type 2 diabetes mellitus with other specified complication: Secondary | ICD-10-CM

## 2022-03-29 LAB — COMPREHENSIVE METABOLIC PANEL
ALT: 25 IU/L (ref 0–32)
AST: 29 IU/L (ref 0–40)
Albumin/Globulin Ratio: 1.7 (ref 1.2–2.2)
Albumin: 4.5 g/dL (ref 3.8–4.9)
Alkaline Phosphatase: 79 IU/L (ref 44–121)
BUN/Creatinine Ratio: 25 — ABNORMAL HIGH (ref 9–23)
BUN: 19 mg/dL (ref 6–24)
Bilirubin Total: 0.5 mg/dL (ref 0.0–1.2)
CO2: 20 mmol/L (ref 20–29)
Calcium: 9.5 mg/dL (ref 8.7–10.2)
Chloride: 100 mmol/L (ref 96–106)
Creatinine, Ser: 0.75 mg/dL (ref 0.57–1.00)
Globulin, Total: 2.7 g/dL (ref 1.5–4.5)
Glucose: 168 mg/dL — ABNORMAL HIGH (ref 70–99)
Potassium: 4.6 mmol/L (ref 3.5–5.2)
Sodium: 141 mmol/L (ref 134–144)
Total Protein: 7.2 g/dL (ref 6.0–8.5)
eGFR: 96 mL/min/{1.73_m2} (ref >59)

## 2022-03-29 LAB — CBC
Hematocrit: 41.4 % (ref 34.0–46.6)
Hemoglobin: 14.2 g/dL (ref 11.1–15.9)
MCH: 29.3 pg (ref 26.6–33.0)
MCHC: 34.3 g/dL (ref 31.5–35.7)
MCV: 86 fL (ref 79–97)
Platelets: 245 10*3/uL (ref 150–450)
RBC: 4.84 x10E6/uL (ref 3.77–5.28)
RDW: 13 % (ref 11.7–15.4)
WBC: 6.6 10*3/uL (ref 3.4–10.8)

## 2022-03-29 LAB — LIPID PANEL
Chol/HDL Ratio: 2.5 ratio (ref 0.0–4.4)
Cholesterol, Total: 202 mg/dL — ABNORMAL HIGH (ref 100–199)
HDL: 82 mg/dL (ref >39)
LDL Chol Calc (NIH): 109 mg/dL — ABNORMAL HIGH (ref 0–99)
Triglycerides: 61 mg/dL (ref 0–149)
VLDL Cholesterol Cal: 11 mg/dL (ref 5–40)

## 2022-03-29 MED ORDER — ATORVASTATIN CALCIUM 20 MG PO TABS: 20.0000 mg | ORAL_TABLET | Freq: Every day | ORAL | 4 refills | Status: DC

## 2022-03-29 MED ORDER — GLIMEPIRIDE 2 MG PO TABS: 1.0000 mg | ORAL_TABLET | Freq: Every day | ORAL | 2 refills | Status: DC

## 2022-03-29 NOTE — Progress Notes (Signed)
Let patient know that her cholesterol level is 109 with goal being less than 70.  High cholesterol increases risk for heart attack and strokes.  If she has been taking the atorvastatin consistently, I recommend increasing the dose to 20 mg daily.  Please let me know so that I can send an updated prescription to her pharmacy.  Kidney and liver function tests are normal.  Blood cell count normal.

## 2022-03-29 NOTE — Telephone Encounter (Signed)
Karen Tanner could you take care of this  ?

## 2022-03-30 MED ORDER — ONETOUCH VERIO VI STRP: ORAL_STRIP | 2 refills | Status: DC

## 2022-03-30 MED ORDER — ONETOUCH VERIO FLEX SYSTEM W/DEVICE KIT: PACK | 0 refills | Status: DC

## 2022-03-30 MED ORDER — ONETOUCH DELICA LANCETS 33G MISC: 2 refills | Status: DC

## 2022-03-30 NOTE — Telephone Encounter (Signed)
Rx sent 

## 2022-04-04 ENCOUNTER — Inpatient Hospital Stay: Payer: Medicare HMO

## 2022-04-04 ENCOUNTER — Encounter (HOSPITAL_COMMUNITY): Payer: Self-pay

## 2022-04-04 ENCOUNTER — Telehealth: Payer: Self-pay | Admitting: *Deleted

## 2022-04-04 ENCOUNTER — Inpatient Hospital Stay: Payer: Medicare HMO | Admitting: Hematology and Oncology

## 2022-04-04 NOTE — Telephone Encounter (Signed)
This RN spoke with pt per her call stating her transportation arrangement did not come as scheduled for visit today. ? ?This RN rescheduled pt per above with pt on the phone. ?

## 2022-04-09 ENCOUNTER — Telehealth: Payer: Self-pay | Admitting: Pharmacist

## 2022-04-09 DIAGNOSIS — I10 Essential (primary) hypertension: Secondary | ICD-10-CM

## 2022-04-09 NOTE — Patient Outreach (Signed)
Patient appearing on report for [employer] - Hypertension Control report due to last documented ambulatory blood pressure of 179/97 on 02/01/2022. Next appointment with PCP is 06/01/22  ? ?Outreached patient to discuss hypertension control and medication management.  ? ?Current medications: amlodipine 10 mg daily, valsartan 40 mg daily  ? ?Patient does not have an automated upper arm home BP machine ? ?Reports still struggling with back and and pain s/p lumpectomy ? ?Patient denies hypotensive signs and symptoms including dizziness, lightheadedness.  ?Patient denies hypertensive symptoms including headache, chest pain, shortness of breath ? ?Patient denies side effects related to amlodipine or valsartan  ? ?Diabetes: reports she needs a prescription for Accu-Chek test strips sent to the pharmacy ? ?Medications Reviewed Today   ? ? Reviewed by Osker Mason, RPH-CPP (Pharmacist) on 04/09/22 at [place] List Status: <None>  ? ?Medication Order Taking? Sig Documenting Provider Last Dose Status Informant  ?amLODipine (NORVASC) 10 MG tablet [id number] Yes Take 1 tablet (10 mg total) by mouth daily. Ladell Pier, MD Taking Active   ?atorvastatin (LIPITOR) 20 MG tablet [id number] Yes Take 1 tablet (20 mg total) by mouth daily. Ladell Pier, MD Taking Active   ?cyclobenzaprine (FLEXERIL) 5 MG tablet [id number] Yes Take 5 mg by mouth 3 (three) times daily as needed. [provider] Taking Active   ?gabapentin (NEURONTIN) 300 MG capsule [id number] Yes Take 300 mg by mouth 2 (two) times daily. [provider] Taking Active   ?glimepiride (AMARYL) 2 MG tablet [id number] Yes Take 0.5 tablets (1 mg total) by mouth daily before breakfast. Ladell Pier, MD Taking Active   ?meloxicam (MOBIC) 15 MG tablet [id number] No Take 1 tablet (15 mg total) by mouth daily.  ?Patient not taking: Reported on 04/09/2022  ? Ladell Pier, MD Not Taking Active   ?oxyCODONE (ROXICODONE) 5 MG immediate  release tablet [id number] No Take 1 tablet (5 mg total) by mouth every 6 (six) hours as needed for severe pain.  ?Patient not taking: Reported on 04/09/2022  ? Autumn Messing III, MD Not Taking Active   ?valsartan (DIOVAN) 40 MG tablet [id number] Yes Take 1 tablet (40 mg total) by mouth daily. Ladell Pier, MD Taking Active   ? ?  ?  ? ?  ? ? ? ?Assessment/Plan: ?- Currently unknown control ?- - Reviewed goal blood pressure <130/80 ?- Discussed insurance coverage for BP monitor. Will collaborate with provider to place order for BP monitor.  ?- Will collaborate with provider to place order for test strips.  ?- Will call to follow up in 1 week ? ?Catie Hedwig Morton, PharmD, BCACP ?[place] ?[phone] ? ?

## 2022-04-11 ENCOUNTER — Other Ambulatory Visit: Payer: Self-pay

## 2022-04-11 ENCOUNTER — Other Ambulatory Visit: Payer: Self-pay | Admitting: Pharmacist

## 2022-04-11 DIAGNOSIS — E1169 Type 2 diabetes mellitus with other specified complication: Secondary | ICD-10-CM

## 2022-04-11 MED ORDER — MISC. DEVICES MISC: 0 refills | Status: AC

## 2022-04-11 MED ORDER — ACCU-CHEK SOFTCLIX LANCETS MISC: 2 refills | Status: DC

## 2022-04-11 MED ORDER — ACCU-CHEK GUIDE VI STRP: ORAL_STRIP | 12 refills | Status: DC

## 2022-04-11 MED ORDER — ACCU-CHEK GUIDE W/DEVICE KIT: PACK | 0 refills | Status: DC

## 2022-04-11 NOTE — Telephone Encounter (Signed)
Yes I can.

## 2022-04-11 NOTE — Telephone Encounter (Signed)
Yes I can will print rx out for provider to sign  ?

## 2022-04-12 DIAGNOSIS — M5459 Other low back pain: Secondary | ICD-10-CM | POA: Diagnosis not present

## 2022-04-16 DIAGNOSIS — M5459 Other low back pain: Secondary | ICD-10-CM | POA: Diagnosis not present

## 2022-04-17 ENCOUNTER — Telehealth: Payer: Self-pay | Admitting: Hematology and Oncology

## 2022-04-17 ENCOUNTER — Other Ambulatory Visit: Payer: Self-pay | Admitting: Internal Medicine

## 2022-04-17 DIAGNOSIS — E1169 Type 2 diabetes mellitus with other specified complication: Secondary | ICD-10-CM

## 2022-04-17 NOTE — Telephone Encounter (Signed)
R/s pt's new high risk appt per pt request. Pt is aware of new appt date and time. Also mailed updated calendar to pt per request.  ?

## 2022-04-19 ENCOUNTER — Encounter: Payer: Medicaid Other | Admitting: Hematology and Oncology

## 2022-04-23 DIAGNOSIS — M5459 Other low back pain: Secondary | ICD-10-CM | POA: Diagnosis not present

## 2022-04-23 DIAGNOSIS — I10 Essential (primary) hypertension: Secondary | ICD-10-CM | POA: Diagnosis not present

## 2022-04-23 MED ORDER — BLOOD PRESSURE MONITOR KIT: PACK | 0 refills | Status: AC

## 2022-04-23 NOTE — Patient Outreach (Signed)
[facility]. They did not receive order for BP machine that was previously faxed. Resending electronically under Dr. Wynetta Emery.  ? ?Catie Hedwig Morton, PharmD, BCACP ?[place] ?[phone] ? ?

## 2022-04-23 NOTE — Addendum Note (Signed)
Addended by: [name] T on: 04/23/2022 10:16 AM ? ? Modules accepted: Orders ? ?

## 2022-04-24 NOTE — Patient Outreach (Signed)
Called patient to follow up on BP machine access. She will call [facility] today to ask them to deliver BP machine. Counseled on appropriate technique of checking home BP. I will call her in 2 weeks for follow up.  ? ?She also asked about glimepiride and it's role in treatment of diabetes. Discussed risks of hypoglycemia, weight gain. Counseled on benefits of GLP1 and/or SGLT2. Patient elects to think about it, as she is hesitant regarding injections. Discussed that we could schedule for visit with embedded pharmacist for further conversation and demonstration.  ? ?Catie Hedwig Morton, PharmD, BCACP ?[place] ?[phone] ? ?

## 2022-04-26 DIAGNOSIS — M5459 Other low back pain: Secondary | ICD-10-CM | POA: Diagnosis not present

## 2022-04-27 ENCOUNTER — Ambulatory Visit: Payer: Medicare HMO | Admitting: Registered"

## 2022-04-30 DIAGNOSIS — M961 Postlaminectomy syndrome, not elsewhere classified: Secondary | ICD-10-CM | POA: Diagnosis not present

## 2022-04-30 DIAGNOSIS — M47816 Spondylosis without myelopathy or radiculopathy, lumbar region: Secondary | ICD-10-CM | POA: Diagnosis not present

## 2022-04-30 DIAGNOSIS — M545 Low back pain, unspecified: Secondary | ICD-10-CM | POA: Diagnosis not present

## 2022-05-03 DIAGNOSIS — M5459 Other low back pain: Secondary | ICD-10-CM | POA: Diagnosis not present

## 2022-05-08 ENCOUNTER — Ambulatory Visit: Payer: Medicare HMO | Admitting: Dietician

## 2022-05-08 ENCOUNTER — Other Ambulatory Visit: Payer: Self-pay | Admitting: Pharmacist

## 2022-05-08 VITALS — BP 153/97 | HR 82

## 2022-05-08 DIAGNOSIS — I10 Essential (primary) hypertension: Secondary | ICD-10-CM

## 2022-05-08 NOTE — Patient Instructions (Signed)
Shirla,  ? ?It was great talking to you today! ? ?Check your blood pressure once daily, and any time you have concerning symptoms like headache, chest pain, dizziness, shortness of breath, or vision changes.  ? ?Our goal is less than 130/80. ? ?To appropriately check your blood pressure, make sure you do the following:  ?1) Avoid caffeine, exercise, or tobacco products for 30 minutes before checking. Empty your bladder. ?2) Sit with your back supported in a flat-backed chair. Rest your arm on something flat (arm of the chair, table, etc). ?3) Sit still with your feet flat on the floor, resting, for at least 5 minutes.  ?4) Check your blood pressure. Take 1-2 readings.  ?5) Write down these readings and bring with you to any provider appointments. ? ?Bring your home blood pressure machine with you to a provider's office for accuracy comparison at least once a year.  ? ?Make sure you take your blood pressure medications before you come to any office visit, even if you were asked to fast for labs. ? ?Take care! ? ?Catie Hedwig Morton, PharmD, BCACP ?[place] ?[phone] ? ?

## 2022-05-08 NOTE — Chronic Care Management (AMB) (Signed)
? ?   ? ?Chief Complaint  ?Patient presents with  ? Hypertension  ? ? ?Karen Tanner is a 51 y.o. year old female who was referred for medication management by their primary care provider, Ladell Pier, MD. They presented for a telephone visit. ?  ?They were referred to the pharmacist by a quality report for assistance in managing hypertension.  ? ?Subjective: ? ?Care Team: ?Primary Care Provider: Ladell Pier, MD ; Next Scheduled Visit: 06/01/22 ?Oncology:  [place]; Next Scheduled Visit: 05/21/22 ? ?Medication Access/Adherence ? ?Current Pharmacy:  ?[id number] - [place], [address] ?[address] ?[place] [address] ?Phone: [phone] Fax: [phone] ? ?[place], [place] - [address] ?[place] [address] ?Phone: [phone] Fax: [phone] ? ? ?Patient reports affordability concerns with their medications: No  ?Patient reports access/transportation concerns to their pharmacy: No  ?Patient reports adherence concerns with their medications:  No   ? ? ?Diabetes: ? ?Current medications: glimepiride 1 mg daily ?Medications tried in the past: metformin (fatigue) ? ?Current glucose readings: fasting and post prandial were in 90s today ? ?Patient denies hypoglycemic s/sx including dizziness, shakiness, sweating. ? ?Hypertension: ? ?Current medications: valsartan 40 mg daily, amlodipine 10 mg daily ? ?Patient has a validated, automated, upper arm home BP cuff ?Current blood pressure readings readings: 04/30/22 was 169/92 with heart rate of 99, though reports she has been struggling more with pain lately. ? ?Hyperlipidemia/ASCVD Risk Reduction ? ?Current lipid lowering medications: atorvastatin 20 mg daily - though per refill history, 10 mg tablet was last filled 04/30/22 ? ? ?Health Maintenance ? ?Health Maintenance Due  ?Topic Date Due  ? COVID-19 Vaccine (1) Never done  ? FOOT EXAM  Never done  ? Hepatitis C Screening   Never done  ? Zoster Vaccines- Shingrix (1 of 2) Never done  ? COLONOSCOPY (Pts 45-78yrs Insurance coverage will need to be confirmed)  Never done  ?  ? ?Objective: ?Lab Results  ?Component Value Date  ? HGBA1C 7.5 (A) 02/01/2022  ? ? ?Lab Results  ?Component Value Date  ? CREATININE 0.75 03/28/2022  ? BUN 19 03/28/2022  ? NA 141 03/28/2022  ? K 4.6 03/28/2022  ? CL 100 03/28/2022  ? CO2 20 03/28/2022  ? ? ?Lab Results  ?Component Value Date  ? CHOL 202 (H) 03/28/2022  ? HDL 82 03/28/2022  ? LDLCALC 109 (H) 03/28/2022  ? TRIG 61 03/28/2022  ? CHOLHDL 2.5 03/28/2022  ? ? ?Medications Reviewed Today   ? ? Reviewed by Osker Mason, RPH-CPP (Pharmacist) on 05/08/22 at [phone]  Med List Status: <None>  ? ?Medication Order Taking? Sig Documenting Provider Last Dose Status Informant  ?Accu-Chek Softclix Lancets lancets [id number]  Use to check blood sugar once daily. Dx: E11.69 Ladell Pier, MD  Active   ?amLODipine (NORVASC) 10 MG tablet [id number] Yes Take 1 tablet (10 mg total) by mouth daily. Ladell Pier, MD Taking Active   ?atorvastatin (LIPITOR) 20 MG tablet [id number]  TAKE 1 TABLET BY MOUTH EVERY DAY Ladell Pier, MD  Active   ?Blood Glucose Monitoring Suppl (ACCU-CHEK GUIDE) w/Device KIT [id number]  Use to check blood sugar once daily. Dx: E11.69 Ladell Pier, MD  Active   ?Blood Pressure Monitor KIT [id number]  Use to check blood pressure daily Ladell Pier, MD  Active   ?cyclobenzaprine (FLEXERIL) 5 MG tablet [id number]  Take 5 mg by mouth 3 (  three) times daily as needed. [provider]  Active   ?gabapentin (NEURONTIN) 300 MG capsule [id number]  Take 300 mg by mouth 2 (two) times daily. [provider]  Active   ?glimepiride (AMARYL) 2 MG tablet [id number]  Take 0.5 tablets (1 mg total) by mouth daily before breakfast. Ladell Pier, MD  Active   ?glucose blood (ACCU-CHEK GUIDE) test strip [id number]  Use to check blood sugar once daily. Dx: E11.69 Ladell Pier, MD  Active   ?meloxicam (MOBIC) 15 MG tablet [id number]  Take 1 tablet (15 mg total) by mouth daily.  ?Patient not taking: Reported on 04/09/2022  ? Ladell Pier, MD  Active   ?Misc. Devices MISC [id number]  Blood pressure device Ladell Pier, MD  Active   ?oxyCODONE (ROXICODONE) 5 MG immediate release tablet [id number]  Take 1 tablet (5 mg total) by mouth every 6 (six) hours as needed for severe pain.  ?Patient not taking: Reported on 04/09/2022  ? Autumn Messing III, MD  Active   ?valsartan (DIOVAN) 40 MG tablet [id number] Yes Take 1 tablet (40 mg total) by mouth daily. Ladell Pier, MD Taking Active   ? ?  ?  ? ?  ? ? ?Assessment/Plan:  ?Care Plan : Medication Management  ?Updates made by Osker Mason, RPH-CPP since 05/08/2022 12:00 AM  ?  ? ?Problem: Hypertension   ?  ? ?Goal: BP <130/80   ?Note:   ?Current Barriers:  ?Unable to achieve control of blood pressure  ? ?Patient Needs: ?Optimizes regimen ? ?Patient Activities: ?Patient will:  ?- check blood pressure daily, document, and provide at future appointments ?- Contemplate tobacco cessation ? ?  ? ? ?Diabetes: ?- Currently uncontrolled but improving ?- Reviewed goal A1c, goal fasting, and goal 2 hour post prandial glucose ?- Recommend to continue current regimen. Recommend consideration for GLP1 or SGLT2 moving forward.  ? ?Hypertension: ?- Currently uncontrolled ?- Reviewed long term cardiovascular and renal outcomes of uncontrolled blood pressure ?- Reviewed appropriate blood pressure monitoring technique and reviewed goal blood pressure. Recommended to check home blood pressure and heart rate daily ?- Recommend to follow up with PCP as scheduled. Consider dose increase of valsartan ? ?Hyperlipidemia/ASCVD Risk Reduction: ?- Currently uncontrolled.  ?- Reviewed with patient that she can take 2 of the 10 mg atorvastatin tablets to equal 20 mg. [facility] 10 mg dose.  ? ? ?Follow Up Plan: phone call in 4  weeks ? ?Catie Hedwig Morton, PharmD, BCACP ?[place] ?[phone] ? ? ? ?

## 2022-05-09 ENCOUNTER — Telehealth: Payer: Self-pay

## 2022-05-09 NOTE — Telephone Encounter (Signed)
Message received from [name], [place] regarding patient's need for assistance with transportation resources.  ?I spoke to patient and she explained that she receives disability and would like to be able to use [employer] for transportation. She has already contacted them and is waiting for the application.  ?I instructed her to complete part A of the application, answer all questions, and then sign part A and par B ( release of information)  she can then drop the application off at [facility] and I will complete part B and fax the complete application to [employer].  ?I also instructed her to call me back with any questions,  ?

## 2022-05-10 DIAGNOSIS — M5459 Other low back pain: Secondary | ICD-10-CM | POA: Diagnosis not present

## 2022-05-14 DIAGNOSIS — M5459 Other low back pain: Secondary | ICD-10-CM | POA: Diagnosis not present

## 2022-05-17 DIAGNOSIS — M5459 Other low back pain: Secondary | ICD-10-CM | POA: Diagnosis not present

## 2022-05-21 ENCOUNTER — Other Ambulatory Visit: Payer: Self-pay

## 2022-05-21 ENCOUNTER — Inpatient Hospital Stay: Payer: Medicare Other | Attending: Hematology and Oncology | Admitting: Hematology and Oncology

## 2022-05-21 VITALS — BP 158/74 | HR 68 | Temp 97.7°F | Resp 15 | Wt 262.4 lb

## 2022-05-21 DIAGNOSIS — I10 Essential (primary) hypertension: Secondary | ICD-10-CM | POA: Insufficient documentation

## 2022-05-21 DIAGNOSIS — E119 Type 2 diabetes mellitus without complications: Secondary | ICD-10-CM | POA: Insufficient documentation

## 2022-05-21 DIAGNOSIS — F17210 Nicotine dependence, cigarettes, uncomplicated: Secondary | ICD-10-CM

## 2022-05-21 DIAGNOSIS — Z1239 Encounter for other screening for malignant neoplasm of breast: Secondary | ICD-10-CM

## 2022-05-21 DIAGNOSIS — D242 Benign neoplasm of left breast: Secondary | ICD-10-CM | POA: Diagnosis not present

## 2022-05-21 NOTE — Progress Notes (Unsigned)
[facility] CONSULT NOTE  Patient Care Team: Ladell Pier, MD as PCP - General (Internal Medicine)  CHIEF COMPLAINTS/PURPOSE OF CONSULTATION:    ASSESSMENT & PLAN:  This is a pleasant 51 year old female patient with with left breast intraductal papilloma status postlumpectomy referred to high risk breast clinic for recommendations  We discussed her life time risk of breast cancer and 5 yr risk of breast cancer today of 14% and 1.2% per TC and Gail's model respectively  2. We discussed different risk reducing strategies for future breast cancer risk. We discussed chemoprevention and lifestyle modification. We discussed role of ongoing surveillance with mammograms versus MRIs.  At this time she does not qualify for MRI screening or tamoxifen prevention.  3. Lifestyle modification: We discussed different interventions exercise at least 5 days a week including both aerobic as well as weight-bearing exercises and strength training. He discussed dietary modification including increasing number of servings of fruit and vegetables as well as decreased in number of servings of meat. We discussed the importance of maintaining a good BMI and limiting alcohol intake.  4. Surveillance: Patient certainly would be a good candidate for ongoing mammograms on a yearly basis. Certainly she could benefit from a 3-D mammogram. We discussed self breast examination as well as ongoing clinical examination.   5. Chemoprevention: Not indicated  6. Genetics: Genetic testing not indicated.  No family history of breast cancer  7. Followup: Patient will return to clinic annually for breast exam.   HISTORY OF PRESENTING ILLNESS:  Karen Tanner 51 y.o. female is here because of intraductal papilloma noticed on the left breast lumpectomy.  She arrived to the appointment today by herself.  She has healed well from surgery.  She denies any family history of breast cancer or personal history of  abnormal mammograms.  She has underlying diabetes and hypertension but these are well controlled according to the patient.  Rest of the pertinent 10 point ROS reviewed and negative  REVIEW OF SYSTEMS:   Constitutional: Denies fevers, chills or abnormal night sweats Eyes: Denies blurriness of vision, double vision or watery eyes Ears, nose, mouth, throat, and face: Denies mucositis or sore throat Respiratory: Denies cough, dyspnea or wheezes Cardiovascular: Denies palpitation, chest discomfort or lower extremity swelling Gastrointestinal:  Denies nausea, heartburn or change in bowel habits Skin: Denies abnormal skin rashes Lymphatics: Denies new lymphadenopathy or easy bruising Neurological:Denies numbness, tingling or new weaknesses Behavioral/Psych: Mood is stable, no new changes  All other systems were reviewed with the patient and are negative.  MEDICAL HISTORY:  Past Medical History:  Diagnosis Date   Anemia    Arthritis    knee   Diabetes mellitus without complication ([place])    Hypertension    Hypertensive retinopathy     SURGICAL HISTORY: Past Surgical History:  Procedure Laterality Date   BACK SURGERY     Bone fusion   BREAST LUMPECTOMY WITH RADIOACTIVE SEED LOCALIZATION Left 03/09/2022   Procedure: LEFT BREAST LUMPECTOMY WITH RADIOACTIVE SEED LOCALIZATION;  Surgeon: Jovita Kussmaul, MD;  Location: [facility];  Service: General;  Laterality: Left;    SOCIAL HISTORY: Social History   Socioeconomic History   Marital status: Single    Spouse name: Not on file   Number of children: Not on file   Years of education: Not on file   Highest education level: Not on file  Occupational History   Not on file  Tobacco Use   Smoking status: Every Day  Packs/day: 0.25    Years: 14.00    Pack years: 3.50    Types: Cigarettes   Smokeless tobacco: Never   Tobacco comments:    Smoking 6-7 cigs per day  Substance and Sexual Activity   Alcohol use: Yes     Comment: 5-6 drinks per day on weekends   Drug use: Yes    Types: Marijuana   Sexual activity: Not on file  Other Topics Concern   Not on file  Social History Narrative   Not on file   Social Determinants of Health   Financial Resource Strain: Not on file  Food Insecurity: Not on file  Transportation Needs: Not on file  Physical Activity: Not on file  Stress: Not on file  Social Connections: Not on file  Intimate Partner Violence: Not on file    FAMILY HISTORY: Family History  Problem Relation Age of Onset   Diabetes Mother    Hyperlipidemia Mother    Heart disease Mother    Diabetes Father    Diabetes Maternal Grandmother     ALLERGIES:  has No Known Allergies.  MEDICATIONS:  Current Outpatient Medications  Medication Sig Dispense Refill   Accu-Chek Softclix Lancets lancets Use to check blood sugar once daily. Dx: E11.69 100 each 2   amLODipine (NORVASC) 10 MG tablet Take 1 tablet (10 mg total) by mouth daily. 90 tablet 1   atorvastatin (LIPITOR) 20 MG tablet TAKE 1 TABLET BY MOUTH EVERY DAY 90 tablet 0   Blood Glucose Monitoring Suppl (ACCU-CHEK GUIDE) w/Device KIT Use to check blood sugar once daily. Dx: E11.69 1 kit 0   Blood Pressure Monitor KIT Use to check blood pressure daily 1 kit 0   cyclobenzaprine (FLEXERIL) 5 MG tablet Take 5 mg by mouth 3 (three) times daily as needed.     gabapentin (NEURONTIN) 300 MG capsule Take 300 mg by mouth 2 (two) times daily.     glimepiride (AMARYL) 2 MG tablet Take 0.5 tablets (1 mg total) by mouth daily before breakfast. 45 tablet 2   glucose blood (ACCU-CHEK GUIDE) test strip Use to check blood sugar once daily. Dx: E11.69 100 each 12   meloxicam (MOBIC) 15 MG tablet Take 1 tablet (15 mg total) by mouth daily. (Patient not taking: Reported on 04/09/2022) 30 tablet 3   Misc. Devices MISC Blood pressure device 1 Device 0   oxyCODONE (ROXICODONE) 5 MG immediate release tablet Take 1 tablet (5 mg total) by mouth every 6 (six) hours  as needed for severe pain. (Patient not taking: Reported on 04/09/2022) 10 tablet 0   valsartan (DIOVAN) 40 MG tablet Take 1 tablet (40 mg total) by mouth daily. 90 tablet 3   No current facility-administered medications for this visit.     PHYSICAL EXAMINATION: ECOG PERFORMANCE STATUS: 0 - Asymptomatic  Vitals:   05/21/22 1403  BP: (!) 158/74  Pulse: 68  Resp: 15  Temp: 97.7 F (36.5 C)  SpO2: 100%   Filed Weights   05/21/22 1403  Weight: 262 lb 6.4 oz (119 kg)    GENERAL:alert, no distress and comfortable SKIN: skin color, texture, turgor are normal, no rashes or significant lesions EYES: normal, conjunctiva are pink and non-injected, sclera clear OROPHARYNX:no exudate, no erythema and lips, buccal mucosa, and tongue normal  NECK: supple, thyroid normal size, non-tender, without nodularity LYMPH:  no palpable lymphadenopathy in the cervical, axillary or inguinal LUNGS: clear to auscultation and percussion with normal breathing effort HEART: regular rate & rhythm and  no murmurs and no lower extremity edema ABDOMEN:abdomen soft, non-tender and normal bowel sounds Musculoskeletal:no cyanosis of digits and no clubbing  PSYCH: alert & oriented x 3 with fluent speech NEURO: no focal motor/sensory deficits Breast:.  Bilateral breast examined.  No palpable masses or regional adenopathy  LABORATORY DATA:  I have reviewed the data as listed Lab Results  Component Value Date   WBC 6.6 03/28/2022   HGB 14.2 03/28/2022   HCT 41.4 03/28/2022   MCV 86 03/28/2022   PLT 245 03/28/2022     Chemistry      Component Value Date/Time   NA 141 03/28/2022 1411   K 4.6 03/28/2022 1411   CL 100 03/28/2022 1411   CO2 20 03/28/2022 1411   BUN 19 03/28/2022 1411   CREATININE 0.75 03/28/2022 1411   CREATININE 0.59 06/13/2016 1027      Component Value Date/Time   CALCIUM 9.5 03/28/2022 1411   ALKPHOS 79 03/28/2022 1411   AST 29 03/28/2022 1411   ALT 25 03/28/2022 1411   BILITOT 0.5  03/28/2022 1411       RADIOGRAPHIC STUDIES: I have personally reviewed the radiological images as listed and agreed with the findings in the report. No results found.  All questions were answered. The patient knows to call the clinic with any problems, questions or concerns. I spent 30 minutes in the care of this patient including H and P, review of records, counseling and coordination of care.     Benay Pike, MD 05/22/2022 5:18 PM

## 2022-05-22 ENCOUNTER — Encounter: Payer: Self-pay | Admitting: Hematology and Oncology

## 2022-05-24 DIAGNOSIS — M1711 Unilateral primary osteoarthritis, right knee: Secondary | ICD-10-CM | POA: Diagnosis not present

## 2022-05-29 DIAGNOSIS — M5459 Other low back pain: Secondary | ICD-10-CM | POA: Diagnosis not present

## 2022-06-01 ENCOUNTER — Ambulatory Visit: Payer: Medicare Other | Attending: Internal Medicine | Admitting: Internal Medicine

## 2022-06-01 ENCOUNTER — Encounter: Payer: Self-pay | Admitting: Internal Medicine

## 2022-06-01 DIAGNOSIS — E785 Hyperlipidemia, unspecified: Secondary | ICD-10-CM | POA: Diagnosis not present

## 2022-06-01 DIAGNOSIS — M1711 Unilateral primary osteoarthritis, right knee: Secondary | ICD-10-CM | POA: Diagnosis not present

## 2022-06-01 DIAGNOSIS — Z1211 Encounter for screening for malignant neoplasm of colon: Secondary | ICD-10-CM | POA: Diagnosis not present

## 2022-06-01 DIAGNOSIS — M545 Low back pain, unspecified: Secondary | ICD-10-CM | POA: Insufficient documentation

## 2022-06-01 DIAGNOSIS — Z6841 Body Mass Index (BMI) 40.0 and over, adult: Secondary | ICD-10-CM | POA: Insufficient documentation

## 2022-06-01 DIAGNOSIS — I10 Essential (primary) hypertension: Secondary | ICD-10-CM | POA: Diagnosis not present

## 2022-06-01 DIAGNOSIS — G8929 Other chronic pain: Secondary | ICD-10-CM | POA: Diagnosis not present

## 2022-06-01 DIAGNOSIS — D369 Benign neoplasm, unspecified site: Secondary | ICD-10-CM

## 2022-06-01 DIAGNOSIS — E1169 Type 2 diabetes mellitus with other specified complication: Secondary | ICD-10-CM | POA: Diagnosis not present

## 2022-06-01 DIAGNOSIS — D242 Benign neoplasm of left breast: Secondary | ICD-10-CM | POA: Insufficient documentation

## 2022-06-01 DIAGNOSIS — Z8349 Family history of other endocrine, nutritional and metabolic diseases: Secondary | ICD-10-CM | POA: Diagnosis not present

## 2022-06-01 DIAGNOSIS — E119 Type 2 diabetes mellitus without complications: Secondary | ICD-10-CM | POA: Insufficient documentation

## 2022-06-01 LAB — POCT GLYCOSYLATED HEMOGLOBIN (HGB A1C): HbA1c, POC (controlled diabetic range): 6.8 % (ref 0.0–7.0)

## 2022-06-01 LAB — GLUCOSE, POCT (MANUAL RESULT ENTRY): POC Glucose: 106 mg/dl — AB (ref 70–99)

## 2022-06-01 MED ORDER — MELOXICAM 15 MG PO TABS: 15.0000 mg | ORAL_TABLET | Freq: Every day | ORAL | 3 refills | Status: DC

## 2022-06-01 MED ORDER — ACCU-CHEK SOFTCLIX LANCETS MISC: 2 refills | Status: DC

## 2022-06-01 MED ORDER — GLIMEPIRIDE 2 MG PO TABS: 2.0000 mg | ORAL_TABLET | Freq: Every day | ORAL | 6 refills | Status: DC

## 2022-06-01 MED ORDER — ATORVASTATIN CALCIUM 20 MG PO TABS: 20.0000 mg | ORAL_TABLET | Freq: Every day | ORAL | 6 refills | Status: DC

## 2022-06-01 MED ORDER — VALSARTAN 40 MG PO TABS: 40.0000 mg | ORAL_TABLET | Freq: Every day | ORAL | 6 refills | Status: DC

## 2022-06-01 MED ORDER — AMLODIPINE BESYLATE 10 MG PO TABS: 10.0000 mg | ORAL_TABLET | Freq: Every day | ORAL | 6 refills | Status: DC

## 2022-06-01 NOTE — Progress Notes (Signed)
Patient ID: Karen Tanner, female    DOB: 01/18/1971  MRN: [id number]  CC: Knee Pain, Weight Loss, and Diabetes   Subjective: Karen Tanner is a 51 y.o. female who presents for chronic ds management Her concerns today include: Patient with history of HTN, diabetes, fibroid, HL, anemia, tob dep, chronic LBP had fusion surgery 2004, OA  DM: Changed to Amaryl on last visit.  She was placed on 2 mg half a tablet daily.  However the patient states that most days she is taking the whole tablet.  Checks blood sugars once a day.  Gives range of 109-130's.  Lowest was 70.  Low occasionally. She is trying to do good with her eating habits.  Staying away from sugary drinks and trying to eat less.  Not able to move as much as she can because of issues with her right knee.  Saw [facility] last week and had fluid removed followed by injection to the knee.  Still has a lot of pain in the knee especially when exposed to cold temperatures.  She tells me she has been taking ibuprofen with Robaxin and cyclobenzaprine.  Robaxin is not on her med list but she reports that she has some at home from previous.  Out of meloxicam.  Also has chronic LBP.  Schedule for ESI with Dr. Horald Chestnut next week for that. Results for orders placed or performed in visit on 06/01/22  Glucose (CBG)  Result Value Ref Range   POC Glucose 106 (A) 70 - 99 mg/dl  HgB U4Q  Result Value Ref Range   Hemoglobin A1C     HbA1c POC (<> result, manual entry)     HbA1c, POC (prediabetic range)     HbA1c, POC (controlled diabetic range) 6.8 0.0 - 7.0 %   HTN: Diovan added to Norvasc on last visit.  Reports compliance with both medications.  Out of Diovan x2 weeks.  She limits salt in the foods.  Checks blood pressure every other day.  Reports numbers have been good except for yesterday it was very high after she ate a salty piece of meat.  HL: LDL was 109.  Recommended increase Lipitor to 20 mg daily which she is taking consistently and  tolerating.  Had papilloma removed from the left breast 02/2022.  I sent her to oncologist Dr. Al Pimple to determine whether she needed chemoprophylaxis.  He said she did not.  She should continue with 3D mammography annually.  Patient Active Problem List   Diagnosis Date Noted   Intraductal papilloma 12/20/2021   Drinking binge 04/17/2021   Tobacco dependence 04/17/2021   Hyperlipidemia associated with type 2 diabetes mellitus (HCC) 04/17/2021   Uterine leiomyoma 08/21/2018   Type 2 diabetes mellitus with obesity (HCC) 05/21/2016   History of back surgery 10/26/2014   History of anemia 10/26/2014   Essential hypertension 10/26/2014   Chronic low back pain 10/26/2014   Menorrhagia with regular cycle 10/26/2014     Current Outpatient Medications on File Prior to Visit  Medication Sig Dispense Refill   Blood Glucose Monitoring Suppl (ACCU-CHEK GUIDE) w/Device KIT Use to check blood sugar once daily. Dx: E11.69 1 kit 0   Blood Pressure Monitor KIT Use to check blood pressure daily 1 kit 0   cyclobenzaprine (FLEXERIL) 5 MG tablet Take 5 mg by mouth 3 (three) times daily as needed.     gabapentin (NEURONTIN) 300 MG capsule Take 300 mg by mouth 2 (two) times daily.     glucose  blood (ACCU-CHEK GUIDE) test strip Use to check blood sugar once daily. Dx: E11.69 100 each 12   Misc. Devices MISC Blood pressure device 1 Device 0   oxyCODONE (ROXICODONE) 5 MG immediate release tablet Take 1 tablet (5 mg total) by mouth every 6 (six) hours as needed for severe pain. 10 tablet 0   No current facility-administered medications on file prior to visit.    No Known Allergies  Social History   Socioeconomic History   Marital status: Single    Spouse name: Not on file   Number of children: Not on file   Years of education: Not on file   Highest education level: Not on file  Occupational History   Not on file  Tobacco Use   Smoking status: Every Day    Packs/day: 0.25    Years: 14.00    Pack  years: 3.50    Types: Cigarettes   Smokeless tobacco: Never   Tobacco comments:    Smoking 6-7 cigs per day  Substance and Sexual Activity   Alcohol use: Yes    Comment: 5-6 drinks per day on weekends   Drug use: Yes    Types: Marijuana   Sexual activity: Not on file  Other Topics Concern   Not on file  Social History Narrative   Not on file   Social Determinants of Health   Financial Resource Strain: Not on file  Food Insecurity: Not on file  Transportation Needs: Not on file  Physical Activity: Not on file  Stress: Not on file  Social Connections: Not on file  Intimate Partner Violence: Not on file    Family History  Problem Relation Age of Onset   Diabetes Mother    Hyperlipidemia Mother    Heart disease Mother    Diabetes Father    Diabetes Maternal Grandmother     Past Surgical History:  Procedure Laterality Date   BACK SURGERY     Bone fusion   BREAST LUMPECTOMY WITH RADIOACTIVE SEED LOCALIZATION Left 03/09/2022   Procedure: LEFT BREAST LUMPECTOMY WITH RADIOACTIVE SEED LOCALIZATION;  Surgeon: Jovita Kussmaul, MD;  Location: [place];  Service: General;  Laterality: Left;    ROS: Review of Systems Negative except as stated above  PHYSICAL EXAM: BP (!) 148/87 (BP Location: Left Arm, Patient Position: Sitting, Cuff Size: Large)   Pulse 62   Temp 98.2 F (36.8 C) (Oral)   Resp 16   Wt 262 lb (118.8 kg)   SpO2 99%   BMI 41.04 kg/m   Wt Readings from Last 3 Encounters:  06/01/22 262 lb (118.8 kg)  05/21/22 262 lb 6.4 oz (119 kg)  03/09/22 266 lb 12.1 oz (121 kg)   Repeat blood pressure 155/88 Physical Exam  General appearance - alert, well appearing, middle-aged African-American female and in no distress Mental status - normal mood, behavior, speech, dress, motor activity, and thought processes Neck - supple, no significant adenopathy Chest - clear to auscultation, no wheezes, rales or rhonchi, symmetric air entry Heart - normal  rate, regular rhythm, normal S1, S2, no murmurs, rubs, clicks or gallops Extremities - peripheral pulses normal, no pedal edema, no clubbing or cyanosis MSK: RT knee - large body habitus. Mild to moderate discomfort on passive ROM     Latest Ref Rng & Units 03/28/2022    2:11 PM 03/09/2022    8:14 AM 04/17/2021    5:05 PM  CMP  Glucose 70 - 99 mg/dL 168   136  108    BUN 6 - 24 mg/dL $Remove'19   11   15    'gPdnEfY$ Creatinine 0.57 - 1.00 mg/dL 0.75   0.71   0.92    Sodium 134 - 144 mmol/L 141   140   143    Potassium 3.5 - 5.2 mmol/L 4.6   4.2   4.4    Chloride 96 - 106 mmol/L 100   102   102    CO2 20 - 29 mmol/L $RemoveB'20   30   24    'cCYFyFOv$ Calcium 8.7 - 10.2 mg/dL 9.5   9.4   10.0    Total Protein 6.0 - 8.5 g/dL 7.2    7.4    Total Bilirubin 0.0 - 1.2 mg/dL 0.5    0.4    Alkaline Phos 44 - 121 IU/L 79    94    AST 0 - 40 IU/L 29    20    ALT 0 - 32 IU/L 25    19     Lipid Panel     Component Value Date/Time   CHOL 202 (H) 03/28/2022 1411   TRIG 61 03/28/2022 1411   HDL 82 03/28/2022 1411   CHOLHDL 2.5 03/28/2022 1411   CHOLHDL 3.2 01/23/2016 0956   VLDL 9 01/23/2016 0956   LDLCALC 109 (H) 03/28/2022 1411    CBC    Component Value Date/Time   WBC 6.6 03/28/2022 1411   WBC 16.4 (H) 03/25/2018 1520   RBC 4.84 03/28/2022 1411   RBC 4.68 03/25/2018 1520   HGB 14.2 03/28/2022 1411   HCT 41.4 03/28/2022 1411   PLT 245 03/28/2022 1411   MCV 86 03/28/2022 1411   MCH 29.3 03/28/2022 1411   MCH 22.2 (L) 03/25/2018 1520   MCHC 34.3 03/28/2022 1411   MCHC 31.0 03/25/2018 1520   RDW 13.0 03/28/2022 1411   LYMPHSABS 1.0 03/25/2018 1520   MONOABS 0.7 03/25/2018 1520   EOSABS 0.1 03/25/2018 1520   BASOSABS 0.0 03/25/2018 1520    ASSESSMENT AND PLAN:  1. Type 2 diabetes mellitus with morbid obesity (HCC) At goal.  Continue Amaryl.  I have changed it to the full 2 mg tablet daily. Discussed and encouraged healthy eating habits.  Advised to move as much as her knee and back will allow. - Glucose  (CBG) - HgB A1c - glimepiride (AMARYL) 2 MG tablet; Take 1 tablet (2 mg total) by mouth daily before breakfast.  Dispense: 30 tablet; Refill: 6 - Accu-Chek Softclix Lancets lancets; Use to check blood sugar once daily. Dx: E11.69  Dispense: 100 each; Refill: 2  2. Essential hypertension Not at goal.  Out of Diovan.  Refill sent.  Continue Diovan and Norvasc - valsartan (DIOVAN) 40 MG tablet; Take 1 tablet (40 mg total) by mouth daily.  Dispense: 30 tablet; Refill: 6 - amLODipine (NORVASC) 10 MG tablet; Take 1 tablet (10 mg total) by mouth daily.  Dispense: 30 tablet; Refill: 6  3. Hyperlipidemia associated with type 2 diabetes mellitus (HCC) - atorvastatin (LIPITOR) 20 MG tablet; Take 1 tablet (20 mg total) by mouth daily.  Dispense: 30 tablet; Refill: 6  4. Primary osteoarthritis of right knee Advised to stop the ibuprofen and Robaxin.  Refill sent on meloxicam.  Okay to take the meloxicam with the Flexeril.  Strongly encouraged weight loss. - meloxicam (MOBIC) 15 MG tablet; Take 1 tablet (15 mg total) by mouth daily.  Dispense: 30 tablet; Refill: 3  5. Screening for colon cancer -  Ambulatory referral to Gastroenterology  6. Intraductal papilloma Patient to have yearly 3D mammography.   Patient was given the opportunity to ask questions.  Patient verbalized understanding of the plan and was able to repeat key elements of the plan.   This documentation was completed using Radio producer.  Any transcriptional errors are unintentional.  Orders Placed This Encounter  Procedures   Ambulatory referral to Gastroenterology   Glucose (CBG)   HgB A1c     Requested Prescriptions   Signed Prescriptions Disp Refills   valsartan (DIOVAN) 40 MG tablet 30 tablet 6    Sig: Take 1 tablet (40 mg total) by mouth daily.   atorvastatin (LIPITOR) 20 MG tablet 30 tablet 6    Sig: Take 1 tablet (20 mg total) by mouth daily.   glimepiride (AMARYL) 2 MG tablet 30 tablet 6     Sig: Take 1 tablet (2 mg total) by mouth daily before breakfast.   amLODipine (NORVASC) 10 MG tablet 30 tablet 6    Sig: Take 1 tablet (10 mg total) by mouth daily.   Accu-Chek Softclix Lancets lancets 100 each 2    Sig: Use to check blood sugar once daily. Dx: E11.69   meloxicam (MOBIC) 15 MG tablet 30 tablet 3    Sig: Take 1 tablet (15 mg total) by mouth daily.    Return in about 4 months (around 10/01/2022).  Karle Plumber, MD, FACP

## 2022-06-01 NOTE — Progress Notes (Signed)
Follow up-  Request for fluid pills right knee

## 2022-06-04 ENCOUNTER — Telehealth: Payer: Self-pay

## 2022-06-04 NOTE — Telephone Encounter (Signed)
Call placed to patient and completed application for [employer] [place].  It was then faxed to [employer] [place] eligibility

## 2022-06-05 DIAGNOSIS — M47816 Spondylosis without myelopathy or radiculopathy, lumbar region: Secondary | ICD-10-CM | POA: Diagnosis not present

## 2022-06-11 ENCOUNTER — Telehealth: Payer: Self-pay | Admitting: Student-PharmD

## 2022-06-11 NOTE — Telephone Encounter (Signed)
Patient appearing on report for [employer] [facility] - Hypertension Control report due to last documented ambulatory blood pressure of 148/87 on 06/01/22. Next appointment with PCP is not yet scheduled but planned for October.    Outreached patient to discuss hypertension control and medication management. She reports she got all of her medications except for valsartan. She thought Dr. Wynetta Emery sent her in a refill but the pharmacy would not give it to her. Per fill history she filled a 90 DS in April so likely her insurance will not approve a refill this early as she does have active refills on file. She is still taking amlodipine.   Current antihypertensives: amlodipine 10 mg (taking), valsartan 40 mg daily (not taking)  Patient has an automated upper arm home BP machine.   Current blood pressure readings: has not checked in the last few weeks; readings prior to that were: 169/97, 152/92, 153/97  Patient denies hypotensive signs and symptoms including dizziness, lightheadedness.  Patient denies hypertensive symptoms including headache, chest pain, shortness of breath.  Assessment/Plan: - Currently uncontrolled - Reviewed goal blood pressure <130/80 - Reviewed appropriate administration of medication regimen - Reviewed to check blood pressure daily, document, and provide at next provider visit. Encouraged her to bring her home BP cuff for validation at next visit.  - Encouraged continued adherence and to take her medications before all visits.   - Called her pharmacy to see why she is not able to get valsartan. They confirmed that they filled a 90 DS for her on 04/16/22 so she would not be able to get a refill through her insurance until July. They said she could call her insurance and report she lost her supply and her insurance could authorize an early refill. Provided her with the phone number to call her insurance to ask for this and she wrote it down: [phone] - Scheduled visit with clinical  pharmacist at [facility] for BP check and to confirm she was able to get supply of valsartan from her pharmacy.   Karen Tanner, PharmD PGY2 Ambulatory Care Pharmacy Resident 06/11/2022 1:57 PM

## 2022-06-13 DIAGNOSIS — M5459 Other low back pain: Secondary | ICD-10-CM | POA: Diagnosis not present

## 2022-06-18 DIAGNOSIS — M5459 Other low back pain: Secondary | ICD-10-CM | POA: Diagnosis not present

## 2022-06-20 DIAGNOSIS — M5459 Other low back pain: Secondary | ICD-10-CM | POA: Diagnosis not present

## 2022-06-25 ENCOUNTER — Encounter: Payer: Medicare Other | Attending: Internal Medicine | Admitting: Skilled Nursing Facility1

## 2022-06-25 ENCOUNTER — Encounter: Payer: Self-pay | Admitting: Skilled Nursing Facility1

## 2022-06-25 DIAGNOSIS — E669 Obesity, unspecified: Secondary | ICD-10-CM | POA: Diagnosis present

## 2022-06-25 DIAGNOSIS — E1169 Type 2 diabetes mellitus with other specified complication: Secondary | ICD-10-CM | POA: Insufficient documentation

## 2022-06-25 DIAGNOSIS — M5459 Other low back pain: Secondary | ICD-10-CM | POA: Diagnosis not present

## 2022-06-27 DIAGNOSIS — M5459 Other low back pain: Secondary | ICD-10-CM | POA: Diagnosis not present

## 2022-07-04 DIAGNOSIS — M5459 Other low back pain: Secondary | ICD-10-CM | POA: Diagnosis not present

## 2022-07-05 DIAGNOSIS — M5459 Other low back pain: Secondary | ICD-10-CM | POA: Diagnosis not present

## 2022-07-09 DIAGNOSIS — M5459 Other low back pain: Secondary | ICD-10-CM | POA: Diagnosis not present

## 2022-07-11 DIAGNOSIS — M5459 Other low back pain: Secondary | ICD-10-CM | POA: Diagnosis not present

## 2022-07-12 ENCOUNTER — Telehealth: Payer: Self-pay

## 2022-07-12 NOTE — Telephone Encounter (Signed)
I spoke to [employer] and she confirmed that the patient has been approved for transportation services.

## 2022-07-16 ENCOUNTER — Ambulatory Visit: Payer: Medicare Other | Admitting: Pharmacist

## 2022-07-16 DIAGNOSIS — M5459 Other low back pain: Secondary | ICD-10-CM | POA: Diagnosis not present

## 2022-07-18 DIAGNOSIS — M5459 Other low back pain: Secondary | ICD-10-CM | POA: Diagnosis not present

## 2022-07-30 ENCOUNTER — Encounter: Payer: Self-pay | Admitting: Physician Assistant

## 2022-07-30 ENCOUNTER — Ambulatory Visit
Admission: EM | Admit: 2022-07-30 | Discharge: 2022-07-30 | Disposition: A | Discharge status: Home / Self Care | Payer: Medicare Other | Attending: Physician Assistant | Admitting: Physician Assistant

## 2022-07-30 DIAGNOSIS — K047 Periapical abscess without sinus: Secondary | ICD-10-CM

## 2022-07-30 MED ORDER — AMOXICILLIN-POT CLAVULANATE 875-125 MG PO TABS: 1.0000 | ORAL_TABLET | Freq: Two times a day (BID) | ORAL | 0 refills | Status: DC

## 2022-07-30 MED ORDER — CEFTRIAXONE SODIUM 1 G IJ SOLR
1.0000 g | Freq: Once | INTRAMUSCULAR | Status: AC
  Administered 2022-07-30: 1 g via INTRAMUSCULAR

## 2022-07-30 NOTE — ED Triage Notes (Signed)
Pt presents to uc with co of pain and swelling to l side of face. Pt reports ha started yesterday after eating popcorn. Pt reports she woke up with swelling to l side of face this morning and is concerned for gum abscess. Pt reports she lost that tooth on the l side a while a go and has not been to a dentist in years. Pt endorses smoking 1/2 ppd cigarettes. Pt is unsure if she has dental coverage

## 2022-07-30 NOTE — ED Provider Notes (Signed)
[facility]    CSN: [id number] Arrival date & time: 07/30/22  1146      History   Chief Complaint Chief Complaint  Patient presents with   Dental Problem    HPI Karen Tanner is a 51 y.o. female.   Patient here today for evaluation of left sided facial swelling, presumed to be dental abscess. She first noticed swelling and pain yesterday. She noted she had eaten popcorn the evening before symptoms began. She has had dental abscess in the past and states her current symptoms are similar. She tried rinsing with peroxide and warm compresses with mild improvement but no resolution.   The history is provided by the patient.    Past Medical History:  Diagnosis Date   Anemia    Arthritis    knee   Diabetes mellitus without complication ([place])    Hypertension    Hypertensive retinopathy     Patient Active Problem List   Diagnosis Date Noted   Intraductal papilloma 12/20/2021   Drinking binge 04/17/2021   Tobacco dependence 04/17/2021   Hyperlipidemia associated with type 2 diabetes mellitus ([place]) 04/17/2021   Uterine leiomyoma 08/21/2018   Type 2 diabetes mellitus with obesity ([place]) 05/21/2016   History of back surgery 10/26/2014   History of anemia 10/26/2014   Essential hypertension 10/26/2014   Chronic low back pain 10/26/2014   Menorrhagia with regular cycle 10/26/2014    Past Surgical History:  Procedure Laterality Date   BACK SURGERY     Bone fusion   BREAST LUMPECTOMY WITH RADIOACTIVE SEED LOCALIZATION Left 03/09/2022   Procedure: LEFT BREAST LUMPECTOMY WITH RADIOACTIVE SEED LOCALIZATION;  Surgeon: Jovita Kussmaul, MD;  Location: [place];  Service: General;  Laterality: Left;    OB History   No obstetric history on file.      Home Medications    Prior to Admission medications   Medication Sig Start Date End Date Taking? Authorizing Provider  amoxicillin-clavulanate (AUGMENTIN) 875-125 MG tablet Take 1 tablet by mouth  every 12 (twelve) hours. 07/30/22  Yes Francene Finders, PA-C  Accu-Chek Softclix Lancets lancets Use to check blood sugar once daily. Dx: E11.69 06/01/22   Ladell Pier, MD  amLODipine (NORVASC) 10 MG tablet Take 1 tablet (10 mg total) by mouth daily. 06/01/22   Ladell Pier, MD  atorvastatin (LIPITOR) 20 MG tablet Take 1 tablet (20 mg total) by mouth daily. 06/01/22   Ladell Pier, MD  Blood Glucose Monitoring Suppl (ACCU-CHEK GUIDE) w/Device KIT Use to check blood sugar once daily. Dx: E11.69 04/11/22   Ladell Pier, MD  Blood Pressure Monitor KIT Use to check blood pressure daily 04/23/22   Ladell Pier, MD  cyclobenzaprine (FLEXERIL) 5 MG tablet Take 5 mg by mouth 3 (three) times daily as needed. 03/01/22   [provider]  gabapentin (NEURONTIN) 300 MG capsule Take 300 mg by mouth 2 (two) times daily. 01/11/22   [provider]  glimepiride (AMARYL) 2 MG tablet Take 1 tablet (2 mg total) by mouth daily before breakfast. 06/01/22   Ladell Pier, MD  glucose blood (ACCU-CHEK GUIDE) test strip Use to check blood sugar once daily. Dx: E11.69 04/11/22   Ladell Pier, MD  meloxicam (MOBIC) 15 MG tablet Take 1 tablet (15 mg total) by mouth daily. 06/01/22   Ladell Pier, MD  Misc. Devices MISC Blood pressure device 04/11/22   Ladell Pier, MD  oxyCODONE (ROXICODONE) 5 MG  immediate release tablet Take 1 tablet (5 mg total) by mouth every 6 (six) hours as needed for severe pain. 03/09/22   Autumn Messing III, MD  valsartan (DIOVAN) 40 MG tablet Take 1 tablet (40 mg total) by mouth daily. 06/01/22   Ladell Pier, MD    Family History Family History  Problem Relation Age of Onset   Diabetes Mother    Hyperlipidemia Mother    Heart disease Mother    Diabetes Father    Diabetes Maternal Grandmother     Social History Social History   Tobacco Use   Smoking status: Every Day    Packs/day: 0.25    Years: 14.00    Total pack years: 3.50     Types: Cigarettes   Smokeless tobacco: Never   Tobacco comments:    Smoking 6-7 cigs per day  Substance Use Topics   Alcohol use: Yes    Comment: 5-6 drinks per day on weekends   Drug use: Yes    Types: Marijuana     Allergies   Patient has no known allergies.   Review of Systems Review of Systems  Constitutional:  Negative for chills and fever.  HENT:  Positive for dental problem and facial swelling. Negative for trouble swallowing.   Eyes:  Negative for discharge and redness.  Respiratory:  Negative for shortness of breath.   Gastrointestinal:  Negative for nausea and vomiting.     Physical Exam Triage Vital Signs ED Triage Vitals  Enc Vitals Group     BP 07/30/22 1230 (!) 161/79     Pulse Rate 07/30/22 1230 65     Resp 07/30/22 1230 18     Temp 07/30/22 1230 97.6 F (36.4 C)     Temp src --      SpO2 07/30/22 1230 97 %     Weight --      Height --      Head Circumference --      Peak Flow --      Pain Score 07/30/22 1229 6     Pain Loc --      Pain Edu? --      Excl. in [place]? --    No data found.  Updated Vital Signs BP (!) 161/79   Pulse 65   Temp 97.6 F (36.4 C)   Resp 18   SpO2 97%       Physical Exam Vitals and nursing note reviewed.  Constitutional:      General: She is not in acute distress.    Appearance: Normal appearance. She is not ill-appearing.  HENT:     Head: Normocephalic and atraumatic.     Comments: Diffuse swelling noted to left maxillary area    Mouth/Throat:     Comments: Poor dentition diffusely, multiple missing teeth, Erythema, exudate, swelling to left upper lateral gums  Eyes:     Conjunctiva/sclera: Conjunctivae normal.  Cardiovascular:     Rate and Rhythm: Normal rate.  Pulmonary:     Effort: Pulmonary effort is normal.  Neurological:     Mental Status: She is alert.  Psychiatric:        Mood and Affect: Mood normal.        Behavior: Behavior normal.        Thought Content: Thought content normal.      UC  Treatments / Results  Labs (all labs ordered are listed, but only abnormal results are displayed) Labs Reviewed - No data to display  EKG  Radiology No results found.  Procedures Procedures (including critical care time)  Medications Ordered in UC Medications  cefTRIAXone (ROCEPHIN) injection 1 g (1 g Intramuscular Given 07/30/22 1304)    Initial Impression / Assessment and Plan / UC Course  I have reviewed the triage vital signs and the nursing notes.  Pertinent labs & imaging results that were available during my care of the patient were reviewed by me and considered in my medical decision making (see chart for details).    Rocephin injection administered in office and oral antibiotic prescribed. Encouraged follow up if no gradual improvement or if symptoms worsen in any way. Resources provided for local dentistry.  Final Clinical Impressions(s) / UC Diagnoses   Final diagnoses:  Dental abscess   Discharge Instructions   None    ED Prescriptions     Medication Sig Dispense Auth. Provider   amoxicillin-clavulanate (AUGMENTIN) 875-125 MG tablet Take 1 tablet by mouth every 12 (twelve) hours. 14 tablet Francene Finders, PA-C      PDMP not reviewed this encounter.   Francene Finders, PA-C 07/30/22 1309

## 2022-08-02 DIAGNOSIS — M5459 Other low back pain: Secondary | ICD-10-CM | POA: Diagnosis not present

## 2022-08-07 DIAGNOSIS — M5459 Other low back pain: Secondary | ICD-10-CM | POA: Diagnosis not present

## 2022-08-13 DIAGNOSIS — M5459 Other low back pain: Secondary | ICD-10-CM | POA: Diagnosis not present

## 2022-08-15 DIAGNOSIS — M5459 Other low back pain: Secondary | ICD-10-CM | POA: Diagnosis not present

## 2022-08-22 DIAGNOSIS — M5459 Other low back pain: Secondary | ICD-10-CM | POA: Diagnosis not present

## 2022-08-27 DIAGNOSIS — M5459 Other low back pain: Secondary | ICD-10-CM | POA: Diagnosis not present

## 2022-08-29 DIAGNOSIS — M5459 Other low back pain: Secondary | ICD-10-CM | POA: Diagnosis not present

## 2022-09-06 ENCOUNTER — Emergency Department (HOSPITAL_COMMUNITY)
Admission: EM | Admit: 2022-09-06 | Discharge: 2022-09-07 | Disposition: A | Discharge status: Home / Self Care | Payer: Medicare Other | Attending: Emergency Medicine | Admitting: Emergency Medicine

## 2022-09-06 ENCOUNTER — Emergency Department (HOSPITAL_COMMUNITY): Payer: Medicare Other

## 2022-09-06 DIAGNOSIS — I10 Essential (primary) hypertension: Secondary | ICD-10-CM | POA: Insufficient documentation

## 2022-09-06 DIAGNOSIS — T887XXA Unspecified adverse effect of drug or medicament, initial encounter: Secondary | ICD-10-CM | POA: Diagnosis not present

## 2022-09-06 DIAGNOSIS — R404 Transient alteration of awareness: Secondary | ICD-10-CM | POA: Diagnosis not present

## 2022-09-06 DIAGNOSIS — R0602 Shortness of breath: Secondary | ICD-10-CM | POA: Diagnosis not present

## 2022-09-06 DIAGNOSIS — R7309 Other abnormal glucose: Secondary | ICD-10-CM | POA: Insufficient documentation

## 2022-09-06 DIAGNOSIS — T50901A Poisoning by unspecified drugs, medicaments and biological substances, accidental (unintentional), initial encounter: Secondary | ICD-10-CM

## 2022-09-06 DIAGNOSIS — R739 Hyperglycemia, unspecified: Secondary | ICD-10-CM | POA: Diagnosis not present

## 2022-09-06 DIAGNOSIS — R55 Syncope and collapse: Secondary | ICD-10-CM | POA: Diagnosis not present

## 2022-09-06 DIAGNOSIS — T50904A Poisoning by unspecified drugs, medicaments and biological substances, undetermined, initial encounter: Secondary | ICD-10-CM | POA: Diagnosis not present

## 2022-09-06 NOTE — ED Provider Notes (Signed)
[facility] EMERGENCY DEPARTMENT Provider Note   CSN: [id number] Arrival date & time: 09/06/22  2319     History  Chief Complaint  Patient presents with   Drug Overdose    Karen Tanner is a 51 y.o. female.  The history is provided by the patient.     Patient presents for presumed accidental overdose.  Patient was found by her roommate with a "unknown white substance" on her nose.  Patient was found initially unresponsive but woke up to touch and sound.  EMS brought patient in but she did not require any interventions.  No CPR was initiated.  No Narcan was given.  Patient reports that she thought she was using "powder" but she has not used this in over a year.  She denies any use of fentanyl/heroin/opiates She does admit to alcohol use and smoking marijuana She denies benzodiazepine use No Trauma is reported   Home Medications Prior to Admission medications   Medication Sig Start Date End Date Taking? Authorizing Provider  Accu-Chek Softclix Lancets lancets Use to check blood sugar once daily. Dx: E11.69 06/01/22   Ladell Pier, MD  amLODipine (NORVASC) 10 MG tablet Take 1 tablet (10 mg total) by mouth daily. 06/01/22   Ladell Pier, MD  atorvastatin (LIPITOR) 20 MG tablet Take 1 tablet (20 mg total) by mouth daily. 06/01/22   Ladell Pier, MD  Blood Glucose Monitoring Suppl (ACCU-CHEK GUIDE) w/Device KIT Use to check blood sugar once daily. Dx: E11.69 04/11/22   Ladell Pier, MD  Blood Pressure Monitor KIT Use to check blood pressure daily 04/23/22   Ladell Pier, MD  cyclobenzaprine (FLEXERIL) 5 MG tablet Take 5 mg by mouth 3 (three) times daily as needed. 03/01/22   [provider]  gabapentin (NEURONTIN) 300 MG capsule Take 300 mg by mouth 2 (two) times daily. 01/11/22   [provider]  glimepiride (AMARYL) 2 MG tablet Take 1 tablet (2 mg total) by mouth daily before breakfast. 06/01/22   Ladell Pier, MD   glucose blood (ACCU-CHEK GUIDE) test strip Use to check blood sugar once daily. Dx: E11.69 04/11/22   Ladell Pier, MD  meloxicam (MOBIC) 15 MG tablet Take 1 tablet (15 mg total) by mouth daily. 06/01/22   Ladell Pier, MD  Misc. Devices MISC Blood pressure device 04/11/22   Ladell Pier, MD  valsartan (DIOVAN) 40 MG tablet Take 1 tablet (40 mg total) by mouth daily. 06/01/22   Ladell Pier, MD      Allergies    Patient has no known allergies.    Review of Systems   Review of Systems  Physical Exam Updated Vital Signs BP 123/70   Pulse 64   Temp 98.8 F (37.1 C) (Axillary)   Resp 19   Ht 1.702 m ($Remove'5\' 7"'NSYFESh$ )   Wt 118.8 kg   SpO2 91%   BMI 41.02 kg/m  Physical Exam CONSTITUTIONAL: Disheveled, awake but is slow to respond HEAD: Normocephalic/atraumatic, no signs of trauma EYES: Pupils are equal and pinpoint.  No nystagmus Patient is tearful ENMT: Mucous membranes moist NECK: supple no meningeal signs SPINE/BACK:entire spine nontender, no bruising/crepitance/stepoffs noted to spine CV: S1/S2 noted, no murmurs/rubs/gallops noted LUNGS: Lungs are clear to auscultation bilaterally, no apparent distress ABDOMEN: soft, nontender NEURO: Pt is awake but slow to respond.  She answers most questions appropriately.  No facial droop.  She is able to move all extremities but has very poor  effort moving her legs EXTREMITIES: pulses normal/equal, full ROM, no visible trauma, no deformities SKIN: warm, color normal PSYCH: Flat affect  ED Results / Procedures / Treatments   Labs (all labs ordered are listed, but only abnormal results are displayed) Labs Reviewed  COMPREHENSIVE METABOLIC PANEL - Abnormal; Notable for the following components:      Result Value   Glucose, Bld 181 (*)    Creatinine, Ser 1.04 (*)    Total Bilirubin 0.2 (*)    Anion gap 16 (*)    All other components within normal limits  SALICYLATE LEVEL - Abnormal; Notable for the following components:    Salicylate Lvl <2.1 (*)    All other components within normal limits  ACETAMINOPHEN LEVEL - Abnormal; Notable for the following components:   Acetaminophen (Tylenol), Serum <10 (*)    All other components within normal limits  CBC WITH DIFFERENTIAL/PLATELET - Abnormal; Notable for the following components:   Neutro Abs 8.1 (*)    Abs Immature Granulocytes 0.17 (*)    All other components within normal limits  CBG MONITORING, ED - Abnormal; Notable for the following components:   Glucose-Capillary 163 (*)    All other components within normal limits  ETHANOL  BRAIN NATRIURETIC PEPTIDE  RAPID URINE DRUG SCREEN, HOSP PERFORMED    EKG EKG Interpretation  Date/Time:  Thursday September 06 2022 23:34:19 EDT Ventricular Rate:  69 PR Interval:  167 QRS Duration: 92 QT Interval:  423 QTC Calculation: 454 R Axis:   -6 Text Interpretation: Sinus rhythm Probable left atrial enlargement Abnormal R-wave progression, early transition LVH with secondary repolarization abnormality No significant change since last tracing Confirmed by Ripley Fraise [phone]) on 09/06/2022 11:36:53 PM  Radiology DG Chest Port 1 View  Result Date: 09/07/2022 CLINICAL DATA:  Overdose. EXAM: PORTABLE CHEST 1 VIEW COMPARISON:  Chest radiograph dated 03/25/2018. FINDINGS: Mild cardiomegaly with mild vascular congestion. No focal consolidation, pleural effusion, pneumothorax. No acute osseous pathology. IMPRESSION: Mild cardiomegaly with mild vascular congestion. No focal consolidation. Electronically Signed   By: Anner Crete M.D.   On: 09/07/2022 00:32    Procedures Procedures    Medications Ordered in ED Medications  ondansetron (ZOFRAN) injection 4 mg (4 mg Intravenous Given 09/07/22 0655)    ED Course/ Medical Decision Making/ A&P Clinical Course as of 09/07/22 0717  Thu Sep 06, 2022  2347 Patient with presumed overdose.  It appears that she thought she was using cocaine and likely was spiked with  opiate/fentanyl.  Currently awake and alert and protecting her airway.  Follow closely [DW]  Fri Sep 07, 2022  0041 Mental status unchanged, still responsive to voice [DW]  0212 Glucose-Capillary(!): 163 Hyperglycemia  [DW]  6701 Patient continues to improve and is more alert [DW]  [phone] Patient is now more awake and alert.  She reports she wanted to snort a small amount of cocaine.  She reports  it tasted like aspirin.  She then started it and went unresponsive.  She is now taking fluids back to baseline.  She reports feeling generalized weakness, but is moving all extremities.  Her mental status is appropriate. [DW]  0715 Still suspicious cocaine was Laced with opiates [DW]  0716 After several hours of observation she is safe for discharge.  She has a ride home [DW]    Clinical Course User Index [DW] Ripley Fraise, MD  Medical Decision Making Amount and/or Complexity of Data Reviewed Labs: ordered. Decision-making details documented in ED Course. Radiology: ordered.  Risk Prescription drug management.   This patient presents to the ED for concern of altered mental status/presumed overdose, this involves an extensive number of treatment options, and is a complaint that carries with it a high risk of complications and morbidity.  The differential diagnosis includes but is not limited to polysubstance overdose, opiate overdose, alcohol intoxication, benzodiazepine abuse  Comorbidities that complicate the patient evaluation: Patient's presentation is complicated by their history of hypertension  Social Determinants of Health: Patient's  substance use   increases the complexity of managing their presentation  Additional history obtained: Records reviewed Primary Care Documents  Lab Tests: I Ordered, and personally interpreted labs.  The pertinent results include: Hyperglycemia  Imaging Studies ordered: I ordered imaging studies including X-ray chest   I  independently visualized and interpreted imaging which showed cardiomegaly I agree with the radiologist interpretation  Cardiac Monitoring: The patient was maintained on a cardiac monitor.  I personally viewed and interpreted the cardiac monitor which showed an underlying rhythm of:  sinus rhythm  Medicines ordered and prescription drug management: I ordered medication including Zofran for nausea Reevaluation of the patient after these medicines showed that the patient    improved  Reevaluation: After the interventions noted above, I reevaluated the patient and found that they have :improved  Complexity of problems addressed: Patient's presentation is most consistent with  acute presentation with potential threat to life or bodily function  Disposition: After consideration of the diagnostic results and the patient's response to treatment,  I feel that the patent would benefit from discharge   .           Final Clinical Impression(s) / ED Diagnoses Final diagnoses:  Accidental overdose, initial encounter    Rx / DC Orders ED Discharge Orders     None         Ripley Fraise, MD 09/07/22 [phone]

## 2022-09-06 NOTE — ED Triage Notes (Signed)
Pt found by her roommate with "unknown white substance on her nostrils" patient awaken to touch and sound. When asked what was on her nose, patient response "I didn't buy so I dont know"

## 2022-09-07 DIAGNOSIS — T50901A Poisoning by unspecified drugs, medicaments and biological substances, accidental (unintentional), initial encounter: Secondary | ICD-10-CM | POA: Diagnosis not present

## 2022-09-07 LAB — COMPREHENSIVE METABOLIC PANEL
ALT: 21 U/L (ref 0–44)
AST: 25 U/L (ref 15–41)
Albumin: 4.3 g/dL (ref 3.5–5.0)
Alkaline Phosphatase: 74 U/L (ref 38–126)
Anion gap: 16 — ABNORMAL HIGH (ref 5–15)
BUN: 13 mg/dL (ref 6–20)
CO2: 25 mmol/L (ref 22–32)
Calcium: 9.7 mg/dL (ref 8.9–10.3)
Chloride: 101 mmol/L (ref 98–111)
Creatinine, Ser: 1.04 mg/dL — ABNORMAL HIGH (ref 0.44–1.00)
GFR, Estimated: >60 mL/min (ref >60)
Glucose, Bld: 181 mg/dL — ABNORMAL HIGH (ref 70–99)
Potassium: 4.1 mmol/L (ref 3.5–5.1)
Sodium: 142 mmol/L (ref 135–145)
Total Bilirubin: 0.2 mg/dL — ABNORMAL LOW (ref 0.3–1.2)
Total Protein: 8.1 g/dL (ref 6.5–8.1)

## 2022-09-07 LAB — SALICYLATE LEVEL: Salicylate Lvl: <7 mg/dL — ABNORMAL LOW (ref 7.0–30.0)

## 2022-09-07 LAB — CBC WITH DIFFERENTIAL/PLATELET
Abs Immature Granulocytes: 0.17 10*3/uL — ABNORMAL HIGH (ref 0.00–0.07)
Basophils Absolute: 0.1 10*3/uL (ref 0.0–0.1)
Basophils Relative: 1 %
Eosinophils Absolute: 0 10*3/uL (ref 0.0–0.5)
Eosinophils Relative: 0 %
HCT: 43.5 % (ref 36.0–46.0)
Hemoglobin: 14.2 g/dL (ref 12.0–15.0)
Immature Granulocytes: 2 %
Lymphocytes Relative: 13 %
Lymphs Abs: 1.3 10*3/uL (ref 0.7–4.0)
MCH: 28.3 pg (ref 26.0–34.0)
MCHC: 32.6 g/dL (ref 30.0–36.0)
MCV: 86.8 fL (ref 80.0–100.0)
Monocytes Absolute: 0.8 10*3/uL (ref 0.1–1.0)
Monocytes Relative: 8 %
Neutro Abs: 8.1 10*3/uL — ABNORMAL HIGH (ref 1.7–7.7)
Neutrophils Relative %: 76 %
Platelets: 271 10*3/uL (ref 150–400)
RBC: 5.01 MIL/uL (ref 3.87–5.11)
RDW: 14.6 % (ref 11.5–15.5)
WBC: 10.5 10*3/uL (ref 4.0–10.5)
nRBC: 0 % (ref 0.0–0.2)

## 2022-09-07 LAB — ACETAMINOPHEN LEVEL: Acetaminophen (Tylenol), Serum: <10 ug/mL — ABNORMAL LOW (ref 10–30)

## 2022-09-07 LAB — ETHANOL: Alcohol, Ethyl (B): <10 mg/dL (ref <10)

## 2022-09-07 LAB — BRAIN NATRIURETIC PEPTIDE: B Natriuretic Peptide: 16.4 pg/mL (ref 0.0–100.0)

## 2022-09-07 LAB — CBG MONITORING, ED: Glucose-Capillary: 163 mg/dL — ABNORMAL HIGH (ref 70–99)

## 2022-09-07 MED ORDER — ONDANSETRON HCL 4 MG/2ML IJ SOLN
4.0000 mg | Freq: Once | INTRAMUSCULAR | Status: AC
Start: 2022-09-07 — End: 2022-09-07
  Administered 2022-09-07: 4 mg via INTRAVENOUS
  Filled 2022-09-07: qty 2

## 2022-09-07 NOTE — ED Notes (Signed)
Pt given paper scrubs and wheelchair waiting for d/c

## 2022-09-07 NOTE — ED Notes (Signed)
Went into room to update patient with Dr. Christy Gentles, patient states that she felt nauseous. Provider placed orders. PND discharge

## 2022-09-07 NOTE — ED Notes (Signed)
Attempted to ambulate patient, and patient states "I still feel dizzy and drowsy" MD Wickline made aware.

## 2022-09-07 NOTE — ED Notes (Signed)
Sister Valentino Nose, would like update when disposition is decided.  [phone]

## 2022-09-07 NOTE — ED Notes (Signed)
Patient assisted in changing into scrubs, assisted to waiting room where family is waiting.

## 2022-09-07 NOTE — Discharge Instructions (Signed)
Substance Abuse Treatment Programs ° °Intensive Outpatient Programs °[facility]     °[address]      °[place], [place]                   °[phone]      ° °[facility] °[address] °[place], [place] °[phone] ° °[facility]     °(Inpatient and outpatient)     °[address]           °[phone]   ° °[facility] °[phone] (Suboxone and Methadone) ° °[address]      °[place], [place] [address]      °[phone]      ° °[address] °[place], [place] °[phone] ° °[facility] (Outpatient/Inpatient, Chemical)    °(insurance only) [phone]      °       °[facility] (Groups & Residential) °[place], [place] °[phone] ° °   °[facility]     °[address]     °[place], [place]      °[phone]      ° °[facility] (for caregivers and family) °[address] °[place], [place] °336-299-4655 ° ° ° ° ° °Residential Treatment Programs °[facility]      °[address], [place], [place] [address]  °[phone]      ° °[facility] °[address] [place], [place] [address] °[phone] ° °[facility]        °[phone]      ° °[facility] °[phone] ° °[facility]             °[address]                                         °[place], [place]                                                °[phone] or [phone]                              ° °[facility] [place] °[address] °[place], [address] °[phone] ° °[facility]    °[address]      °[place], [place]     °[phone]      ° °[facility] °[address] °[place], [place] °[phone] ° °[facility]   °[address]     °[place], [place] [address]     °[phone]      °Admissions: 8am-3pm M-F ° °[facility] °[address] °[place],  [place] °336-227-7417 ° °[facility]: Residential Program (90 Days)   °[place], [place]      °336-725-8389 or [phone]    ° °[facility] °[place],  °(Walk in Hours over the weekend or by referral) ° °[facility] °[address], [place],  [address] °[phone] ° °C[facility]: [facility]:  1-877-626-1772 (for crisis response 24 hours a day) °[facility] Hotline:      1-800-256-2452 °Outpatient Psychiatry and Counseling ° °[facility]: Mobile Crisis   Management 24 hours:  1-877-626-1772 ° °[facility] sliding scale fee and walk in schedule: M-F 8am-12pm/1pm-3pm °[address]  °[place], [place] [address] °[phone] ° °[facility] °[address] °[place], [place] [address] °[phone] ° °[facility] ([facility] known [facility])- new patient walk-in appointments available Monday - Friday 8am -3pm.          °[address] °[place], [place] [address] °336-676-6840 or crisis line- 336-676-6905 ° °[facility] Intensive Outpatient Therapy Program °[address] °[place], [place] [address] °[phone] ° °[facility]                  °Crisis Services      °[phone]      °[address]     °[place], [place] [address]                ° °[facility]   °[facility] °[phone] °[address] °[place], [place] [address] ° ° °[facility]          °[address]  °[place], [place] [address]       °[phone] ° °[facility] °[address] °[place], [place] [address] °336-292-1510 ° °[facility]    °[address]    °[place], [place] [address]     °[phone]      ° °Parish McKinney, MD     °[address]     °[place] [address]     °[phone]     °  °[facility] °[address] °[place] [address] ° °[facility]     °[address]     °[place], [place]      °336-542-2076      ° °[facility] °Shamsher Ahluwalia, MD °[address] °[place], [place] [address] °[phone] ° °[facility]     °[address]     °[place], [place] [address]     °336-274-1237      ° °[facility] °[address] °[place], [place] [address] °336-854-4450 °Resources for Temporary Residential Assistance/Crisis Centers ° °DAY CENTERS °[facility] °M-F 8am-3pm   °[address] [place], [place] [address]   336-332-0824 °Services include: laundry, barbering, support groups, case management, phone  & computer access, showers, AA/NA mtgs, mental health/substance abuse nurse, job skills class, disability information, VA assistance, spiritual classes, etc.  ° °HOMELESS SHELTERS ° °[facility]     °[facility]   °[address], [place]     °[phone]       °       °[facility] (women and children)       °[address] °, [place] [address] °336-275-0820 °[email or web address] for application and process °Application Required ° °[facility]   °[address]    °[place] [address]     °[phone]       °             °[facility] °[address] °, [place] [address] °336.273.5572 °336-235-0363(schedule application appt.) °Application Required ° °[facility] (women only)    °[address]     °[place],  [address]     °336-884-1039      °  Intake starts 6pm daily °Need valid ID, SSC, & Police report °[facility] [place] °[address] °[place], [place] °336-881-5420 °Application Required ° °[facility] (men only)     °[address]      °[place], [place]     °[phone]      ° °[facility] °(Pregnant women only) °[address] °[place], [place] °336-275-0206 ° °[facility]      °[address]      °[place], [place] [address]     °[phone]      °       °[facility] °[address] °[place], [place] °336-723-1848 °90 day commitment/SA/Application process ° °[facility](men only)     °[address]     °[place], [place]     °[phone]       °Check-in at 7pm     °       °[facility] °[address] °[place], [place] [address] °336-248-6684 °Men/Women/Women and Children must be there by 7 pm ° °[facility] °[place], [place] °336-722-8721                ° °

## 2022-09-14 ENCOUNTER — Telehealth: Payer: Self-pay | Admitting: Emergency Medicine

## 2022-09-14 NOTE — Telephone Encounter (Signed)
Copied from [place] [phone]. Topic: Appointment Scheduling - Scheduling Inquiry for Clinic >> Sep 14, 2022  9:35 AM Tiffany B wrote: Patient was discharged from [facility] on Friday 09/07/2022 and was advised to follow up with PCP within 2 weeks. Patient would like a follow up call because she will need a Dr. Radene Ou authroizing her to return back to work.

## 2022-09-18 ENCOUNTER — Ambulatory Visit (HOSPITAL_COMMUNITY)
Admission: EM | Admit: 2022-09-18 | Discharge: 2022-09-18 | Disposition: A | Discharge status: Home / Self Care | Payer: Medicare Other | Attending: Family Medicine | Admitting: Family Medicine

## 2022-09-18 ENCOUNTER — Ambulatory Visit: Payer: Self-pay

## 2022-09-18 DIAGNOSIS — R5383 Other fatigue: Secondary | ICD-10-CM | POA: Insufficient documentation

## 2022-09-18 DIAGNOSIS — I10 Essential (primary) hypertension: Secondary | ICD-10-CM | POA: Diagnosis not present

## 2022-09-18 DIAGNOSIS — M5459 Other low back pain: Secondary | ICD-10-CM | POA: Diagnosis not present

## 2022-09-18 LAB — COMPREHENSIVE METABOLIC PANEL
ALT: 20 U/L (ref 0–44)
AST: 23 U/L (ref 15–41)
Albumin: 3.8 g/dL (ref 3.5–5.0)
Alkaline Phosphatase: 61 U/L (ref 38–126)
Anion gap: 9 (ref 5–15)
BUN: 11 mg/dL (ref 6–20)
CO2: 28 mmol/L (ref 22–32)
Calcium: 9.6 mg/dL (ref 8.9–10.3)
Chloride: 102 mmol/L (ref 98–111)
Creatinine, Ser: 0.58 mg/dL (ref 0.44–1.00)
GFR, Estimated: >60 mL/min (ref >60)
Glucose, Bld: 78 mg/dL (ref 70–99)
Potassium: 3.4 mmol/L — ABNORMAL LOW (ref 3.5–5.1)
Sodium: 139 mmol/L (ref 135–145)
Total Bilirubin: 0.5 mg/dL (ref 0.3–1.2)
Total Protein: 7.4 g/dL (ref 6.5–8.1)

## 2022-09-18 LAB — CBC
HCT: 41.1 % (ref 36.0–46.0)
Hemoglobin: 13.4 g/dL (ref 12.0–15.0)
MCH: 27.8 pg (ref 26.0–34.0)
MCHC: 32.6 g/dL (ref 30.0–36.0)
MCV: 85.3 fL (ref 80.0–100.0)
Platelets: 240 10*3/uL (ref 150–400)
RBC: 4.82 MIL/uL (ref 3.87–5.11)
RDW: 13.9 % (ref 11.5–15.5)
WBC: 6.8 10*3/uL (ref 4.0–10.5)
nRBC: 0 % (ref 0.0–0.2)

## 2022-09-18 LAB — CBG MONITORING, ED: Glucose-Capillary: 71 mg/dL (ref 70–99)

## 2022-09-18 LAB — TSH: TSH: 1.955 u[IU]/mL (ref 0.350–4.500)

## 2022-09-18 NOTE — ED Triage Notes (Signed)
Pt has had elevated blood pressure today, pt stated she has been feeling sluggish and tired pt denies chest pain and headaches. Pt stated she passed out on 09/06/2022 and was taken to [facility].

## 2022-09-18 NOTE — Telephone Encounter (Signed)
    Chief Complaint: At PT today pt. BP 146/109. Feels tired. Requests to be worked in this week. Symptoms: Above Frequency: Today Pertinent Negatives: Patient denies any other symptoms Disposition: '[]'$ ED /'[]'$ Urgent Care (no appt availability in office) / '[]'$ Appointment(In office/virtual)/ '[]'$  [facility]/ '[]'$ Home Care/ '[]'$ Refused Recommended Disposition /'[]'$ [facility]/ '[x]'$  Follow-up with PCP Additional Notes: Please advise pt.  Answer Assessment - Initial Assessment Questions 1. BLOOD PRESSURE: "What is the blood pressure?" "Did you take at least two measurements 5 minutes apart?"     146/109 2. ONSET: "When did you take your blood pressure?"     Today 3. HOW: "How did you take your blood pressure?" (e.g., automatic home BP monitor, visiting nurse)     PT 4. HISTORY: "Do you have a history of high blood pressure?"     Yes 5. MEDICINES: "Are you taking any medicines for blood pressure?" "Have you missed any doses recently?"     Yes 6. OTHER SYMPTOMS: "Do you have any symptoms?" (e.g., blurred vision, chest pain, difficulty breathing, headache, weakness)     Tired 7. PREGNANCY: "Is there any chance you are pregnant?" "When was your last menstrual period?"     No  Protocols used: Blood Pressure - High-A-AH

## 2022-09-18 NOTE — Discharge Instructions (Addendum)
Your blood pressure was noted to be elevated during your visit today. If you are currently taking medication for high blood pressure, please ensure you are taking this as directed. If you do not have a history of high blood pressure and your blood pressure remains persistently elevated, you may need to begin taking a medication at some point. You may return here within the next few days to recheck if unable to see your primary care provider or if you do not have a one.  BP (!) 159/94 (BP Location: Right Arm)   Pulse 61   Temp 98.2 F (36.8 C) (Oral)   Resp 12   BP Readings from Last 3 Encounters:  09/18/22 (!) 159/94  09/07/22 107/75  07/30/22 (!) 161/79   You have had labs (blood work) drawn today. We will call you with any significant abnormalities or if there is need to begin or change treatment or pursue further follow up.  You may also review your test results online through [facility]. If you do not have a MyChart account, instructions to sign up should be on your discharge paperwork.

## 2022-09-19 NOTE — Telephone Encounter (Signed)
SBP readings have not been greater than 170's.  She recalls yesterday at PT, DBP was at 104.  Advised  BP goal is 130/80 or lower.  Advised to decrease smoking Advised to decrease salt intake on canned foods and other choices.   Advised to monitor BP at home and record number.  Advised on sx's that will warrant UC/ ED visit.  Reminded of next appointment.  Patient verbalized understanding.

## 2022-09-19 NOTE — ED Provider Notes (Signed)
[place]   [id number] 09/18/22 Arrival Time: 0321  ASSESSMENT & PLAN:  1. Elevated blood pressure reading in office with diagnosis of hypertension   2. Other fatigue    BP (!) 159/94 (BP Location: Right Arm)   Pulse 61   Temp 98.2 F (36.8 C) (Oral)   Resp 12  BP slightly elevated but I doubt this is the main cause of her feeling fatigued lately. No significant lab abnormalities. Ques early viral illness. Comfortable with home observation. Has BP cuff at home to record.  Recommend:  Follow-up Information     Schedule an appointment as soon as possible for a visit  with Ladell Pier, MD.   Specialty: Internal Medicine Contact information: [address] [place] [address] [phone]                 Reviewed expectations re: course of current medical issues. Questions answered. Outlined signs and symptoms indicating need for more acute intervention. Patient verbalized understanding. After Visit Summary given.   SUBJECTIVE:  Karen Tanner is a 51 y.o. female who presents with concerns regarding increased blood pressures. Reports she is tx for HTN. Feeling fatigued/sluggish lately.  She reports taking medications as instructed, no medication side effects noted, no chest pain on exertion, no dyspnea on exertion, no swelling of ankles, no orthostatic dizziness or lightheadedness, and no orthopnea or paroxysmal nocturnal dyspnea.  Denies symptoms of chest pain, palpations, orthopnea, nocturnal dyspnea, or LE edema.  Social History   Tobacco Use  Smoking Status Every Day   Packs/day: 0.25   Years: 14.00   Total pack years: 3.50   Types: Cigarettes  Smokeless Tobacco Never  Tobacco Comments   Smoking 6-7 cigs per day     ROS: As per HPI.   OBJECTIVE:  Vitals:   09/18/22 1637  BP: (!) 159/94  Pulse: 61  Resp: 12  Temp: 98.2 F (36.8 C)  TempSrc: Oral    General appearance: alert; no distress Eyes: PERRLA;  EOMI HENT: normocephalic; atraumatic Neck: supple Lungs: clear to auscultation bilaterally Heart: regular rate and rhythm without murmer Abdomen: soft, non-tender; bowel sounds normal Extremities: no edema; symmetrical with no gross deformities Skin: warm and dry Psychological: alert and cooperative; normal mood and affect   Labs: Results for orders placed or performed during the hospital encounter of 09/18/22  CBC  Result Value Ref Range   WBC 6.8 4.0 - 10.5 K/uL   RBC 4.82 3.87 - 5.11 MIL/uL   Hemoglobin 13.4 12.0 - 15.0 g/dL   HCT 41.1 36.0 - 46.0 %   MCV 85.3 80.0 - 100.0 fL   MCH 27.8 26.0 - 34.0 pg   MCHC 32.6 30.0 - 36.0 g/dL   RDW 13.9 11.5 - 15.5 %   Platelets 240 150 - 400 K/uL   nRBC 0.0 0.0 - 0.2 %  Comprehensive metabolic panel  Result Value Ref Range   Sodium 139 135 - 145 mmol/L   Potassium 3.4 (L) 3.5 - 5.1 mmol/L   Chloride 102 98 - 111 mmol/L   CO2 28 22 - 32 mmol/L   Glucose, Bld 78 70 - 99 mg/dL   BUN 11 6 - 20 mg/dL   Creatinine, Ser 0.58 0.44 - 1.00 mg/dL   Calcium 9.6 8.9 - 10.3 mg/dL   Total Protein 7.4 6.5 - 8.1 g/dL   Albumin 3.8 3.5 - 5.0 g/dL   AST 23 15 - 41 U/L   ALT 20 0 -  44 U/L   Alkaline Phosphatase 61 38 - 126 U/L   Total Bilirubin 0.5 0.3 - 1.2 mg/dL   GFR, Estimated >60 >60 mL/min   Anion gap 9 5 - 15  TSH  Result Value Ref Range   TSH 1.955 0.350 - 4.500 uIU/mL  POC CBG monitoring  Result Value Ref Range   Glucose-Capillary 71 70 - 99 mg/dL   Labs Reviewed  COMPREHENSIVE METABOLIC PANEL - Abnormal; Notable for the following components:      Result Value   Potassium 3.4 (*)    All other components within normal limits  CBC  TSH  CBG MONITORING, ED     No Known Allergies  Past Medical History:  Diagnosis Date   Anemia    Arthritis    knee   Diabetes mellitus without complication (HCC)    Hypertension    Hypertensive retinopathy    Social History   Socioeconomic History   Marital status: Single    Spouse  name: Not on file   Number of children: Not on file   Years of education: Not on file   Highest education level: Not on file  Occupational History   Not on file  Tobacco Use   Smoking status: Every Day    Packs/day: 0.25    Years: 14.00    Total pack years: 3.50    Types: Cigarettes   Smokeless tobacco: Never   Tobacco comments:    Smoking 6-7 cigs per day  Substance and Sexual Activity   Alcohol use: Yes    Comment: 5-6 drinks per day on weekends   Drug use: Yes    Types: Marijuana   Sexual activity: Not on file  Other Topics Concern   Not on file  Social History Narrative   Not on file   Social Determinants of Health   Financial Resource Strain: Not on file  Food Insecurity: Not on file  Transportation Needs: Not on file  Physical Activity: Not on file  Stress: Not on file  Social Connections: Not on file  Intimate Partner Violence: Not on file   Family History  Problem Relation Age of Onset   Diabetes Mother    Hyperlipidemia Mother    Heart disease Mother    Diabetes Father    Diabetes Maternal Grandmother    Past Surgical History:  Procedure Laterality Date   BACK SURGERY     Bone fusion   BREAST LUMPECTOMY WITH RADIOACTIVE SEED LOCALIZATION Left 03/09/2022   Procedure: LEFT BREAST LUMPECTOMY WITH RADIOACTIVE SEED LOCALIZATION;  Surgeon: Jovita Kussmaul, MD;  Location: [facility];  Service: General;  Laterality: Left;       Vanessa Kick, MD 09/19/22 [phone]

## 2022-09-19 NOTE — Telephone Encounter (Signed)
Patient does not have transportation. Would need to get transportation.Unable to make apt tomorrow.

## 2022-09-25 ENCOUNTER — Ambulatory Visit: Payer: Medicare Other | Admitting: Skilled Nursing Facility1

## 2022-10-01 DIAGNOSIS — M5459 Other low back pain: Secondary | ICD-10-CM | POA: Diagnosis not present

## 2022-10-08 ENCOUNTER — Encounter: Payer: Self-pay | Admitting: Internal Medicine

## 2022-10-08 ENCOUNTER — Ambulatory Visit: Payer: Medicare Other | Attending: Internal Medicine | Admitting: Internal Medicine

## 2022-10-08 DIAGNOSIS — F17200 Nicotine dependence, unspecified, uncomplicated: Secondary | ICD-10-CM

## 2022-10-08 DIAGNOSIS — Z1211 Encounter for screening for malignant neoplasm of colon: Secondary | ICD-10-CM

## 2022-10-08 DIAGNOSIS — Z1231 Encounter for screening mammogram for malignant neoplasm of breast: Secondary | ICD-10-CM

## 2022-10-08 DIAGNOSIS — E1169 Type 2 diabetes mellitus with other specified complication: Secondary | ICD-10-CM | POA: Diagnosis not present

## 2022-10-08 DIAGNOSIS — G8929 Other chronic pain: Secondary | ICD-10-CM | POA: Diagnosis not present

## 2022-10-08 DIAGNOSIS — F17210 Nicotine dependence, cigarettes, uncomplicated: Secondary | ICD-10-CM

## 2022-10-08 DIAGNOSIS — M545 Low back pain, unspecified: Secondary | ICD-10-CM | POA: Diagnosis not present

## 2022-10-08 DIAGNOSIS — E785 Hyperlipidemia, unspecified: Secondary | ICD-10-CM

## 2022-10-08 DIAGNOSIS — Z23 Encounter for immunization: Secondary | ICD-10-CM

## 2022-10-08 DIAGNOSIS — E1159 Type 2 diabetes mellitus with other circulatory complications: Secondary | ICD-10-CM

## 2022-10-08 DIAGNOSIS — I152 Hypertension secondary to endocrine disorders: Secondary | ICD-10-CM

## 2022-10-08 DIAGNOSIS — Z9189 Other specified personal risk factors, not elsewhere classified: Secondary | ICD-10-CM

## 2022-10-08 DIAGNOSIS — M1711 Unilateral primary osteoarthritis, right knee: Secondary | ICD-10-CM

## 2022-10-08 DIAGNOSIS — F1090 Alcohol use, unspecified, uncomplicated: Secondary | ICD-10-CM | POA: Diagnosis not present

## 2022-10-08 DIAGNOSIS — Z6841 Body Mass Index (BMI) 40.0 and over, adult: Secondary | ICD-10-CM

## 2022-10-08 LAB — POCT GLYCOSYLATED HEMOGLOBIN (HGB A1C): HbA1c, POC (controlled diabetic range): 7 % (ref 0.0–7.0)

## 2022-10-08 MED ORDER — VALSARTAN 80 MG PO TABS: 80.0000 mg | ORAL_TABLET | Freq: Every day | ORAL | 6 refills | Status: DC

## 2022-10-08 MED ORDER — NICOTINE 21 MG/24HR TD PT24: 21.0000 mg | MEDICATED_PATCH | Freq: Every day | TRANSDERMAL | 1 refills | Status: DC

## 2022-10-08 MED ORDER — METHOCARBAMOL 500 MG PO TABS: 500.0000 mg | ORAL_TABLET | Freq: Two times a day (BID) | ORAL | 1 refills | Status: DC | PRN

## 2022-10-08 MED ORDER — ATORVASTATIN CALCIUM 20 MG PO TABS: 20.0000 mg | ORAL_TABLET | Freq: Every day | ORAL | 3 refills | Status: DC

## 2022-10-08 MED ORDER — MELOXICAM 15 MG PO TABS: 15.0000 mg | ORAL_TABLET | Freq: Every day | ORAL | 4 refills | Status: DC

## 2022-10-08 NOTE — Addendum Note (Signed)
Addended by: Mariana Arn on: 10/08/2022 05:26 PM   Modules accepted: Orders

## 2022-10-08 NOTE — Patient Instructions (Signed)
You will receive the Cologuard kit in the mail for the colon cancer screening test.  I have sent the prescription to your pharmacy for the nicotine patches.  If it is not covered by your insurance, you can call [phone] now to request the nicotine patches for free.  Your blood pressure is not controlled.  We have increased the valsartan to 80 mg daily.

## 2022-10-08 NOTE — Progress Notes (Signed)
Patient ID: Karen Tanner, female    DOB: 1971/10/04  MRN: [id number]  CC: Chronic disease management  Subjective: Karen Tanner is a 51 y.o. female who presents for chronic disease management Her concerns today include:  Patient with history of HTN, diabetes, fibroid, HL, anemia, tob dep, chronic LBP had fusion surgery 2004, OA RT knee, history of papilloma LT breast 02/2022 (does not need chemoprophylaxis per onc Dr. Chryl Heck)  HYPERTENSION Currently taking: see medication list.  Should be on Norvasc 10 mg and Diovan 40 mg daily Med Adherence: $RemoveBefore'[x]'eDWnkeVPmvbDC$  Yes and took medicines already for this morning. Medication side effects: $RemoveBefor'[]'EdFJotxeWgTe$  Yes    '[x]'$  No Adherence with salt restriction: $RemoveBeforeDEI'[x]'dLkWHXTZYpjuoRvp$  Yes    '[]'$  No Home Monitoring?: $RemoveBeforeDEI'[x]'mPYGhtQSUGZbloIJ$  Yes    '[]'$  No Monitoring Frequency: but not so much recently.   Home BP results range:  SOB? $Rem'[]'Bzzw$  Yes    '[]'$  No Chest Pain?: $RemoveBefo'[]'tRnNCLeBgVY$  Yes    '[]'$  No Leg swelling?: $RemoveBefore'[]'EmxDVSAPBAEwm$  Yes    '[]'$  No Headaches?: $RemoveBefore'[]'BoCztklwHXYOf$  Yes    '[]'$  No Dizziness? $RemoveBefor'[]'MyKGcoRxmzjg$  Yes    '[]'$  No Comments:   DM/Obesity:   A1C today is 7 taking Amaryl $RemoveBefor'2mg'ZrlblQPiMWhY$  consistently Checks BS every other day.  Forgot to bring BS log with her.   Admits that she has been eating a lot of fried foods -chicken and fish.  Eating more fruits - banana, pineapple, grapes, berries and strawberries.  No exercise. 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 Rolena Infante.  Goes 2x/wk to work on RT knee and back.  Request some Robaxin for back and knees.  Gabapentin not working.  Out of work for past 1 mth after being rush to ER by her room-mate after she was found with decrease responsiveness.  Pt states she tried a little bit of cocaine given to her by her friend.  Thinks it may have been laced with Fentanyl.  Denies on going cocaine or heroine use. Uses marijuana.  Reports she drinks on wkends -wine, [place]. Drinks 4-5 drinks/night on wkend.     Tob dep:  smoking 1/2 pk/day.  Smoked for 20 yrs but had stopped for 9 yrs.  Started drinking and smoking  cigarettes after her mom died 8 yrs ago.  Wants to quit, not sure she is mentally ready but wants to try the patches.  Smoking more since being out of work for the past 1 mth.    Pain in RT knee, throbbing x 3 days.  Followed by [facility] Dr. Rolena Infante.  They referred her to P.T.  Cleaned her oven on Saturday, body pain since then.  Wants to go back to work later this wk but feels she needs muscle relaxant to ease pain.  Was on Robaxin through [facility] but out.    HL she is supposed to be on atorvastatin 20 mg daily but I do not see it on her current med list.  She tells me that she is taking it.    HM:  due for eye exam.  Agrees for flu vaccine.  Hold off on shingles vaccine.  Referred for c-scope on last visit in 05/2022.  Told she will need someone with her to bring her and stay during the procedure.  Does not have anyone to go with her so she deferred.  Still does not have anyone to go with her.       . Patient Active Problem List   Diagnosis Date Noted  Intraductal papilloma 12/20/2021   Drinking binge 04/17/2021   Tobacco dependence 04/17/2021   Hyperlipidemia associated with type 2 diabetes mellitus ([place]) 04/17/2021   Uterine leiomyoma 08/21/2018   Type 2 diabetes mellitus with obesity ([place]) 05/21/2016   History of back surgery 10/26/2014   History of anemia 10/26/2014   Essential hypertension 10/26/2014   Chronic low back pain 10/26/2014   Menorrhagia with regular cycle 10/26/2014     Current Outpatient Medications on File Prior to Visit  Medication Sig Dispense Refill   Accu-Chek Softclix Lancets lancets Use to check blood sugar once daily. Dx: E11.69 100 each 2   amLODipine (NORVASC) 10 MG tablet Take 1 tablet (10 mg total) by mouth daily. 30 tablet 6   Blood Glucose Monitoring Suppl (ACCU-CHEK GUIDE) w/Device KIT Use to check blood sugar once daily. Dx: E11.69 1 kit 0   Blood Pressure Monitor KIT Use to check blood pressure daily 1 kit 0   gabapentin (NEURONTIN) 300 MG  capsule Take 300 mg by mouth 2 (two) times daily.     gabapentin (NEURONTIN) 300 MG capsule gabapentin 300 mg capsule  Take 1 capsule twice a day by oral route as needed for 30 days.     glimepiride (AMARYL) 2 MG tablet Take 1 tablet (2 mg total) by mouth daily before breakfast. 30 tablet 6   glucose blood (ACCU-CHEK GUIDE) test strip Use to check blood sugar once daily. Dx: E11.69 100 each 12   Misc. Devices MISC Blood pressure device 1 Device 0   No current facility-administered medications on file prior to visit.    No Known Allergies  Social History   Socioeconomic History   Marital status: Single    Spouse name: Not on file   Number of children: Not on file   Years of education: Not on file   Highest education level: Not on file  Occupational History   Not on file  Tobacco Use   Smoking status: Every Day    Packs/day: 0.25    Years: 14.00    Total pack years: 3.50    Types: Cigarettes   Smokeless tobacco: Never   Tobacco comments:    Smoking 6-7 cigs per day  Substance and Sexual Activity   Alcohol use: Yes    Comment: 5-6 drinks per day on weekends   Drug use: Yes    Types: Marijuana   Sexual activity: Not on file  Other Topics Concern   Not on file  Social History Narrative   Not on file   Social Determinants of Health   Financial Resource Strain: Not on file  Food Insecurity: Not on file  Transportation Needs: Not on file  Physical Activity: Not on file  Stress: Not on file  Social Connections: Not on file  Intimate Partner Violence: Not on file    Family History  Problem Relation Age of Onset   Diabetes Mother    Hyperlipidemia Mother    Heart disease Mother    Diabetes Father    Diabetes Maternal Grandmother     Past Surgical History:  Procedure Laterality Date   BACK SURGERY     Bone fusion   BREAST LUMPECTOMY WITH RADIOACTIVE SEED LOCALIZATION Left 03/09/2022   Procedure: LEFT BREAST LUMPECTOMY WITH RADIOACTIVE SEED LOCALIZATION;  Surgeon:  Jovita Kussmaul, MD;  Location: [facility];  Service: General;  Laterality: Left;    ROS: Review of Systems Negative except as stated above  PHYSICAL EXAM: BP (!) 143/84  Pulse 64   Ht $R'5\' 7"'Nh$  (1.702 m)   Wt 259 lb 12.8 oz (117.8 kg)   SpO2 97%   BMI 40.69 kg/m   BP 150/70 Physical Exam  General appearance - alert, well appearing, and in no distress Mental status - normal mood, behavior, speech, dress, motor activity, and thought processes Neck - supple, no significant adenopathy Chest - clear to auscultation, no wheezes, rales or rhonchi, symmetric air entry Heart - normal rate, regular rhythm, normal S1, S2, no murmurs, rubs, clicks or gallops Musculoskeletal -right knee: Positive joint enlargement.  Moderate discomfort with passive range of motion. Extremities - peripheral pulses normal, no pedal edema, no clubbing or cyanosis      Latest Ref Rng & Units 09/18/2022    4:53 PM 09/07/2022    1:37 AM 03/28/2022    2:11 PM  CMP  Glucose 70 - 99 mg/dL 78  181  168   BUN 6 - 20 mg/dL $Remove'11  13  19   'RyDvCIU$ Creatinine 0.44 - 1.00 mg/dL 0.58  1.04  0.75   Sodium 135 - 145 mmol/L 139  142  141   Potassium 3.5 - 5.1 mmol/L 3.4  4.1  4.6   Chloride 98 - 111 mmol/L 102  101  100   CO2 22 - 32 mmol/L $RemoveB'28  25  20   'DkPUDEVe$ Calcium 8.9 - 10.3 mg/dL 9.6  9.7  9.5   Total Protein 6.5 - 8.1 g/dL 7.4  8.1  7.2   Total Bilirubin 0.3 - 1.2 mg/dL 0.5  0.2  0.5   Alkaline Phos 38 - 126 U/L 61  74  79   AST 15 - 41 U/L $Remo'23  25  29   'pawAX$ ALT 0 - 44 U/L $Remo'20  21  25    'JNfUj$ Lipid Panel     Component Value Date/Time   CHOL 202 (H) 03/28/2022 1411   TRIG 61 03/28/2022 1411   HDL 82 03/28/2022 1411   CHOLHDL 2.5 03/28/2022 1411   CHOLHDL 3.2 01/23/2016 0956   VLDL 9 01/23/2016 0956   LDLCALC 109 (H) 03/28/2022 1411    CBC    Component Value Date/Time   WBC 6.8 09/18/2022 1653   RBC 4.82 09/18/2022 1653   HGB 13.4 09/18/2022 1653   HGB 14.2 03/28/2022 1411   HCT 41.1 09/18/2022 1653   HCT 41.4  03/28/2022 1411   PLT 240 09/18/2022 1653   PLT 245 03/28/2022 1411   MCV 85.3 09/18/2022 1653   MCV 86 03/28/2022 1411   MCH 27.8 09/18/2022 1653   MCHC 32.6 09/18/2022 1653   RDW 13.9 09/18/2022 1653   RDW 13.0 03/28/2022 1411   LYMPHSABS 1.3 09/07/2022 0137   MONOABS 0.8 09/07/2022 0137   EOSABS 0.0 09/07/2022 0137   BASOSABS 0.1 09/07/2022 0137    ASSESSMENT AND PLAN: 1. Type 2 diabetes mellitus with morbid obesity (HCC) Close to goal but A1c has increased compared to last visit.  She prefers to try to make changes in her eating habits rather than add additional medication.  States that she will cut back on eating fried foods.  Encouraged her to eat foods that have lower glycemic index like apples, strawberries and blueberries.  Eat smaller amounts of foods that have higher glycemic index like pineapple and grapes.  She will continue current dose of Amaryl.  - Microalbumin / creatinine urine ratio - Ambulatory referral to Ophthalmology  2. Hypertension associated with type 2 diabetes mellitus ([place]) Not at goal.  Continue  current dose of Norvasc 10 mg daily.  Increase Diovan to 80 mg daily. - valsartan (DIOVAN) 80 MG tablet; Take 1 tablet (80 mg total) by mouth daily.  Dispense: 30 tablet; Refill: 6  3. Hyperlipidemia associated with type 2 diabetes mellitus ([place]) I do not see atorvastatin on her list but patient states she is taking the 20 mg daily.  Refill sent.  4. Tobacco dependence Pt is current smoker. Patient advised to quit smoking. Discussed health risks associated with smoking including lung and other types of cancers, chronic lung diseases and CV risks.. Pt ready to give trail of quitting.   Discussed methods to help quit including quitting cold [place], use of NRT, Chantix and Bupropion.  Pt wanting to try: The nicotine patches.  I went over with her how to use the nicotine patches using the stepdown approach.  We will start with the 21 mg patches. _3_ Minutes spent  on counseling. F/U: Assess progress on next visit.  - nicotine (NICODERM CQ - DOSED IN MG/24 HOURS) 21 mg/24hr patch; Place 1 patch (21 mg total) onto the skin daily.  Dispense: 28 patch; Refill: 1  5. History of drug overdose Strongly advise against street drug use.  Advised of the risks of accidental death with any street drug use as nowadays things can be laced with just about anything including fentanyl.  She declines referral to support group  6. Alcohol use disorder Went over with her how much is too much.  Advised that she is drinking too much in 1 setting for a woman.  She plans to cut back  7. Primary osteoarthritis of right knee Encouraged weight loss.  We will put her on meloxicam. - meloxicam (MOBIC) 15 MG tablet; Take 1 tablet (15 mg total) by mouth daily.  Dispense: 30 tablet; Refill: 4  8. Chronic bilateral low back pain, unspecified whether sciatica present Encourage weight loss.  Refill Robaxin.  Start meloxicam. - meloxicam (MOBIC) 15 MG tablet; Take 1 tablet (15 mg total) by mouth daily.  Dispense: 30 tablet; Refill: 4 - methocarbamol (ROBAXIN) 500 MG tablet; Take 1 tablet (500 mg total) by mouth 2 (two) times daily as needed for muscle spasms.  Dispense: 30 tablet; Refill: 1  9. Need for immunization against influenza - Flu Vaccine QUAD 31mo+IM (Fluarix, Fluzone & Alfiuria Quad PF)  10. Screening for colon cancer Since she is not able to get anyone to go with her for the colonoscopy, she is agreeable to doing the Cologuard instead. - Cologuard  11. Encounter for screening mammogram for malignant neoplasm of breast - MM Digital Screening; Future  Note given releasing her to return to work on the 12th of this month.   Patient was given the opportunity to ask questions.  Patient verbalized understanding of the plan and was able to repeat key elements of the plan.   This documentation was completed using Radio producer.  Any transcriptional  errors are unintentional.  Orders Placed This Encounter  Procedures   MM Digital Screening   Flu Vaccine QUAD 58mo+IM (Fluarix, Fluzone & Alfiuria Quad PF)   Microalbumin / creatinine urine ratio   Cologuard   Ambulatory referral to Ophthalmology     Requested Prescriptions   Pending Prescriptions Disp Refills   atorvastatin (LIPITOR) 20 MG tablet 90 tablet 3    Sig: Take 1 tablet (20 mg total) by mouth daily.   Signed Prescriptions Disp Refills   valsartan (DIOVAN) 80 MG tablet 30 tablet 6  Sig: Take 1 tablet (80 mg total) by mouth daily.   nicotine (NICODERM CQ - DOSED IN MG/24 HOURS) 21 mg/24hr patch 28 patch 1    Sig: Place 1 patch (21 mg total) onto the skin daily.   meloxicam (MOBIC) 15 MG tablet 30 tablet 4    Sig: Take 1 tablet (15 mg total) by mouth daily.   methocarbamol (ROBAXIN) 500 MG tablet 30 tablet 1    Sig: Take 1 tablet (500 mg total) by mouth 2 (two) times daily as needed for muscle spasms.    Return in about 4 months (around 02/08/2023) for Appt with Lurena Joiner in 4 wks for [facility]ness Visit.  Karle Plumber, MD, FACP

## 2022-10-08 NOTE — Progress Notes (Signed)
Pt requesting dose increase for amlodipine  Pt requesting Rx Methocarbamol for pain

## 2022-10-09 LAB — MICROALBUMIN / CREATININE URINE RATIO
Creatinine, Urine: 180.2 mg/dL
Microalb/Creat Ratio: 10 mg/g creat (ref 0–29)
Microalbumin, Urine: 18.4 ug/mL

## 2022-10-19 ENCOUNTER — Ambulatory Visit: Payer: Medicare Other | Attending: Internal Medicine | Admitting: *Deleted

## 2022-10-19 DIAGNOSIS — Z13820 Encounter for screening for osteoporosis: Secondary | ICD-10-CM

## 2022-10-19 DIAGNOSIS — Z0000 Encounter for general adult medical examination without abnormal findings: Secondary | ICD-10-CM

## 2022-10-19 DIAGNOSIS — Z1211 Encounter for screening for malignant neoplasm of colon: Secondary | ICD-10-CM

## 2022-10-19 NOTE — Progress Notes (Signed)
Subjective:   Karen Tanner is a 51 y.o. female who presents for Medicare Annual (Subsequent) preventive examination.  I connected with  Karen Tanner on 10/19/22 by a Patient Location: Home enabled telemedicine application and verified that I am speaking with the correct person using two identifiers.  Patient Location: Home  Provider Location: Home Office  I discussed the limitations of evaluation and management by telemedicine. The patient expressed understanding and agreed to proceed.  Nutrition Risk Assessment:  Has the patient had any N/V/D within the last 2 months?  No  Does the patient have any non-healing wounds?  No  Has the patient had any unintentional weight loss or weight gain?  Yes   Diabetes:  Is the patient diabetic?  Yes  If diabetic, was a CBG obtained today?  No  . Has not checked blood sugars in 1 weeks.  Did the patient bring in their glucometer from home?  No  How often do you monitor your CBG's? Rarely.   Financial Strains and Diabetes Management:  Are you having any financial strains with the device, your supplies or your medication? No .  Does the patient want to be seen by Chronic Care Management for management of their diabetes?  No  Would the patient like to be referred to a Nutritionist or for Diabetic Management?  No   Diabetic Exams:  Diabetic Eye Exam: Completed unsure when the exam was complete. Completed with [facility] Diabetic Foot Exam: Overdue, Pt has been advised about the importance in completing this exam. Pt is scheduled for diabetic foot exam on January 2024.        Objective:    There were no vitals filed for this visit. There is no height or weight on file to calculate BMI.     03/09/2022    7:45 AM 10/12/2021   10:45 AM 06/13/2016   10:33 AM 03/03/2016   11:33 AM 04/11/2015    4:36 PM 02/07/2015    1:40 PM 01/11/2015    3:34 PM  Advanced Directives  Does Patient Have a Medical Advance Directive? No No No No No No  No  Would patient like information on creating a medical advance directive? No - Patient declined  No - patient declined information No - patient declined information Yes - Educational materials given Yes - Educational materials given No - patient declined information    Current Medications (verified) Outpatient Encounter Medications as of 10/19/2022  Medication Sig   Accu-Chek Softclix Lancets lancets Use to check blood sugar once daily. Dx: E11.69   amLODipine (NORVASC) 10 MG tablet Take 1 tablet (10 mg total) by mouth daily.   atorvastatin (LIPITOR) 20 MG tablet Take 1 tablet (20 mg total) by mouth daily.   Blood Glucose Monitoring Suppl (ACCU-CHEK GUIDE) w/Device KIT Use to check blood sugar once daily. Dx: E11.69   Blood Pressure Monitor KIT Use to check blood pressure daily   gabapentin (NEURONTIN) 300 MG capsule Take 300 mg by mouth 2 (two) times daily.   gabapentin (NEURONTIN) 300 MG capsule gabapentin 300 mg capsule  Take 1 capsule twice a day by oral route as needed for 30 days.   glimepiride (AMARYL) 2 MG tablet Take 1 tablet (2 mg total) by mouth daily before breakfast.   glucose blood (ACCU-CHEK GUIDE) test strip Use to check blood sugar once daily. Dx: E11.69   meloxicam (MOBIC) 15 MG tablet Take 1 tablet (15 mg total) by mouth daily.   methocarbamol (ROBAXIN) 500  MG tablet Take 1 tablet (500 mg total) by mouth 2 (two) times daily as needed for muscle spasms.   Misc. Devices MISC Blood pressure device   nicotine (NICODERM CQ - DOSED IN MG/24 HOURS) 21 mg/24hr patch Place 1 patch (21 mg total) onto the skin daily.   valsartan (DIOVAN) 80 MG tablet Take 1 tablet (80 mg total) by mouth daily.   No facility-administered encounter medications on file as of 10/19/2022.    Allergies (verified) Patient has no known allergies.   History: Past Medical History:  Diagnosis Date   Anemia    Arthritis    knee   Diabetes mellitus without complication ([place])    Hypertension     Hypertensive retinopathy    Past Surgical History:  Procedure Laterality Date   BACK SURGERY     Bone fusion   BREAST LUMPECTOMY WITH RADIOACTIVE SEED LOCALIZATION Left 03/09/2022   Procedure: LEFT BREAST LUMPECTOMY WITH RADIOACTIVE SEED LOCALIZATION;  Surgeon: Jovita Kussmaul, MD;  Location: [facility];  Service: General;  Laterality: Left;   Family History  Problem Relation Age of Onset   Diabetes Mother    Hyperlipidemia Mother    Heart disease Mother    Diabetes Father    Diabetes Maternal Grandmother    Social History   Socioeconomic History   Marital status: Single    Spouse name: Not on file   Number of children: Not on file   Years of education: Not on file   Highest education level: Not on file  Occupational History   Not on file  Tobacco Use   Smoking status: Every Day    Packs/day: 0.25    Years: 14.00    Total pack years: 3.50    Types: Cigarettes   Smokeless tobacco: Never   Tobacco comments:    Smoking 6-7 cigs per day  Substance and Sexual Activity   Alcohol use: Yes    Comment: 5-6 drinks per day on weekends   Drug use: Yes    Types: Marijuana   Sexual activity: Not on file  Other Topics Concern   Not on file  Social History Narrative   Not on file   Social Determinants of Health   Financial Resource Strain: Not on file  Food Insecurity: Not on file  Transportation Needs: Not on file  Physical Activity: Not on file  Stress: Not on file  Social Connections: Not on file    Tobacco Counseling Ready to quit: Not Answered Counseling given: Not Answered Tobacco comments: Smoking 6-7 cigs per day   Clinical Intake:                 Diabetic?yes         Activities of Daily Living    03/09/2022    7:54 AM  In your present state of health, do you have any difficulty performing the following activities:  Hearing? 0  Vision? 0  Difficulty concentrating or making decisions? 0  Walking or climbing stairs? 0   Dressing or bathing? 0    Patient Care Team: Ladell Pier, MD as PCP - General (Internal Medicine)  Indicate any recent Medical Services you may have received from other than [facility] providers in the past year (date may be approximate).     Assessment:   This is a routine wellness examination for [name].  Hearing/Vision screen No results found.  Dietary issues and exercise activities discussed:     Goals Addressed   None  Depression Screen    06/25/2022    4:45 PM 06/01/2022   12:10 PM 02/01/2022    4:23 PM 12/05/2021    4:20 PM 04/17/2021    4:08 PM 08/21/2018    2:14 PM 06/13/2016   10:33 AM  PHQ 2/9 Scores  PHQ - 2 Score 0 0 0 0 0 0 0  PHQ- 9 Score  1     3    Fall Risk    10/08/2022    9:28 AM 06/25/2022    4:45 PM 02/01/2022    4:22 PM 12/05/2021    4:21 PM 06/13/2016   10:33 AM  [place] in the past year? 0 0 0 0 No  Number falls in past yr: 0  0 0   Injury with Fall? 0  0 0   Risk for fall due to : No Fall Risks  No Fall Risks No Fall Risks   Follow up Falls evaluation completed        FALL RISK PREVENTION PERTAINING TO THE HOME:  Any stairs in or around the home? No  If so, are there any without handrails? No  Home free of loose throw rugs in walkways, pet beds, electrical cords, etc? Yes  Adequate lighting in your home to reduce risk of falls? yes  ASSISTIVE DEVICES UTILIZED TO PREVENT FALLS:  Life alert? No  Use of a cane, walker or w/c? No  Grab bars in the bathroom? No  Shower chair or bench in shower? No  Elevated toilet seat or a handicapped toilet? No   Cognitive Function:        Immunizations Immunization History  Administered Date(s) Administered   Influenza,inj,Quad PF,6+ Mos 10/26/2014, 09/22/2015, 10/08/2022   Influenza-Unspecified 11/28/2021   PNEUMOCOCCAL CONJUGATE-20 12/05/2021   Tdap 01/18/2016  TDAP status: Up to date    Flu Vaccine status: Up to date  Pneumococcal vaccine status: Up to date  Covid-19  vaccine status: Completed vaccines has had first two doses and a booster  Qualifies for Shingles Vaccine? Yes   Zostavax completed No   Shingrix Completed?: No.    Education has been provided regarding the importance of this vaccine. Patient has been advised to call insurance company to determine out of pocket expense if they have not yet received this vaccine. Advised may also receive vaccine at local pharmacy or Health Dept. Verbalized acceptance and understanding.  Screening Tests Health Maintenance  Topic Date Due   COVID-19 Vaccine (1) Never done   FOOT EXAM  Never done   Hepatitis C Screening  Never done   COLONOSCOPY (Pts 45-50yrs Insurance coverage will need to be confirmed)  Never done   OPHTHALMOLOGY EXAM  08/03/2022   Zoster Vaccines- Shingrix (1 of 2) 10/09/2023 (Originally 11/26/1990)   HEMOGLOBIN A1C  04/09/2023   Diabetic kidney evaluation - GFR measurement  09/19/2023   Diabetic kidney evaluation - Urine ACR  10/09/2023   MAMMOGRAM  12/02/2023   PAP SMEAR-Modifier  12/05/2024   TETANUS/TDAP  01/17/2026   INFLUENZA VACCINE  Completed   HIV Screening  Completed   HPV VACCINES  Aged Out    Health Maintenance  Health Maintenance Due  Topic Date Due   COVID-19 Vaccine (1) Never done   FOOT EXAM  Never done   Hepatitis C Screening  Never done   COLONOSCOPY (Pts 45-22yrs Insurance coverage will need to be confirmed)  Never done   OPHTHALMOLOGY EXAM  08/03/2022    Colorectal cancer screening: Referral  to GI placed 10/19/2022. Pt aware the office will call re: appt.  Mammogram status: Completed 12/01/2021. Repeat every year  Bone Density status: Ordered 10/20/22023. Pt provided with contact info and advised to call to schedule appt.  Lung Cancer Screening: (Low Dose CT Chest recommended if Age 25-80 years, 30 pack-year currently smoking OR have quit w/in 15years.) does not qualify.   Lung Cancer Screening Referral: does not qualify  Additional  Screening:  Hepatitis C Screening: does qualify; needs to be collected  Vision Screening: Recommended annual ophthalmology exams for early detection of glaucoma and other disorders of the eye. Is the patient up to date with their annual eye exam?  Yes  Who is the provider or what is the name of the office in which the patient attends annual eye exams? If pt is not established with a provider, would they like to be referred to a provider to establish care? No .   Dental Screening: Recommended annual dental exams for proper oral hygiene  Community Resource Referral / Chronic Care Management: CRR required this visit?  No   CCM required this visit?  No      Plan:     I have personally reviewed and noted the following in the patient's chart:   Medical and social history Use of alcohol, tobacco or illicit drugs  Current medications and supplements including opioid prescriptions. Patient is not currently taking opioid prescriptions. Functional ability and status Nutritional status Physical activity Advanced directives List of other physicians Hospitalizations, surgeries, and ER visits in previous 12 months Vitals Screenings to include cognitive, depression, and falls Referrals and appointments  In addition, I have reviewed and discussed with patient certain preventive protocols, quality metrics, and best practice recommendations. A written personalized care plan for preventive services as well as general preventive health recommendations were provided to patient.     Carilyn Goodpasture, RN   10/19/2022   Nurse Notes: Non-Face-to-Face 15 minutes   Ms. Reasor , Thank you for taking time to come for your Medicare Wellness Visit. I appreciate your ongoing commitment to your health goals. Please review the following plan we discussed and let me know if I can assist you in the future.   These are the goals we discussed:  Goals   None     This is a list of the screening  recommended for you and due dates:  Health Maintenance  Topic Date Due   Medicare Annual Wellness Visit  Never done   COVID-19 Vaccine (1) Never done   Complete foot exam   Never done   Hepatitis C Screening: USPSTF Recommendation to screen - Ages 68-79 yo.  Never done   Colon Cancer Screening  Never done   Eye exam for diabetics  08/03/2022   Zoster (Shingles) Vaccine (1 of 2) 10/09/2023*   Hemoglobin A1C  04/09/2023   Yearly kidney function blood test for diabetes  09/19/2023   Yearly kidney health urinalysis for diabetes  10/09/2023   Mammogram  12/02/2023   Pap Smear  12/05/2024   Tetanus Vaccine  01/17/2026   Flu Shot  Completed   HIV Screening  Completed   HPV Vaccine  Aged Out  *Topic was postponed. The date shown is not the original due date.

## 2022-10-19 NOTE — Patient Instructions (Signed)
Please  remember speak to your PCP about a foot exam, Hepatitis C screening, bone density scan, and shingles vaccine the next time you come in for an office visit. Someone will call you to schedule a colonoscopy.    Health Maintenance, Female Adopting a healthy lifestyle and getting preventive care are important in promoting health and wellness. Ask your health care provider about: The right schedule for you to have regular tests and exams. Things you can do on your own to prevent diseases and keep yourself healthy. What should I know about diet, weight, and exercise? Eat a healthy diet  Eat a diet that includes plenty of vegetables, fruits, low-fat dairy products, and lean protein. Do not eat a lot of foods that are high in solid fats, added sugars, or sodium. Maintain a healthy weight Body mass index (BMI) is used to identify weight problems. It estimates body fat based on height and weight. Your health care provider can help determine your BMI and help you achieve or maintain a healthy weight. Get regular exercise Get regular exercise. This is one of the most important things you can do for your health. Most adults should: Exercise for at least 150 minutes each week. The exercise should increase your heart rate and make you sweat (moderate-intensity exercise). Do strengthening exercises at least twice a week. This is in addition to the moderate-intensity exercise. Spend less time sitting. Even light physical activity can be beneficial. Watch cholesterol and blood lipids Have your blood tested for lipids and cholesterol at 53 years of age, then have this test every 5 years. Have your cholesterol levels checked more often if: Your lipid or cholesterol levels are high. You are older than 53 years of age. You are at high risk for heart disease. What should I know about cancer screening? Depending on your health history and family history, you may need to have cancer screening at various  ages. This may include screening for: Breast cancer. Cervical cancer. Colorectal cancer. Skin cancer. Lung cancer. What should I know about heart disease, diabetes, and high blood pressure? Blood pressure and heart disease High blood pressure causes heart disease and increases the risk of stroke. This is more likely to develop in people who have high blood pressure readings or are overweight. Have your blood pressure checked: Every 3-5 years if you are 48-79 years of age. Every year if you are 39 years old or older. Diabetes Have regular diabetes screenings. This checks your fasting blood sugar level. Have the screening done: Once every three years after age 53 if you are at a normal weight and have a low risk for diabetes. More often and at a younger age if you are overweight or have a high risk for diabetes. What should I know about preventing infection? Hepatitis B If you have a higher risk for hepatitis B, you should be screened for this virus. Talk with your health care provider to find out if you are at risk for hepatitis B infection. Hepatitis C Testing is recommended for: Everyone born from [birth year] through 1967. Anyone with known risk factors for hepatitis C. Sexually transmitted infections (STIs) Get screened for STIs, including gonorrhea and chlamydia, if: You are sexually active and are younger than 53 years of age. You are older than 53 years of age and your health care provider tells you that you are at risk for this type of infection. Your sexual activity has changed since you were last screened, and you are at increased risk  for chlamydia or gonorrhea. Ask your health care provider if you are at risk. Ask your health care provider about whether you are at high risk for HIV. Your health care provider may recommend a prescription medicine to help prevent HIV infection. If you choose to take medicine to prevent HIV, you should first get tested for HIV. You should then be tested  every 3 months for as long as you are taking the medicine. Pregnancy If you are about to stop having your period (premenopausal) and you may become pregnant, seek counseling before you get pregnant. Take 400 to 800 micrograms (mcg) of folic acid every day if you become pregnant. Ask for birth control (contraception) if you want to prevent pregnancy. Osteoporosis and menopause Osteoporosis is a disease in which the bones lose minerals and strength with aging. This can result in bone fractures. If you are 53 years old or older, or if you are at risk for osteoporosis and fractures, ask your health care provider if you should: Be screened for bone loss. Take a calcium or vitamin D supplement to lower your risk of fractures. Be given hormone replacement therapy (HRT) to treat symptoms of menopause. Follow these instructions at home: Alcohol use Do not drink alcohol if: Your health care provider tells you not to drink. You are pregnant, may be pregnant, or are planning to become pregnant. If you drink alcohol: Limit how much you have to: 0-1 drink a day. Know how much alcohol is in your drink. In the [place] one drink equals one 12 oz bottle of beer (355 mL), one 5 oz glass of wine (148 mL), or one 1 oz glass of hard liquor (44 mL). Lifestyle Do not use any products that contain nicotine or tobacco. These products include cigarettes, chewing tobacco, and vaping devices, such as e-cigarettes. If you need help quitting, ask your health care provider. Do not use street drugs. Do not share needles. Ask your health care provider for help if you need support or information about quitting drugs. General instructions Schedule regular health, dental, and eye exams. Stay current with your vaccines. Tell your health care provider if: You often feel depressed. You have ever been abused or do not feel safe at home. Summary Adopting a healthy lifestyle and getting preventive care are important in promoting  health and wellness. Follow your health care provider's instructions about healthy diet, exercising, and getting tested or screened for diseases. Follow your health care provider's instructions on monitoring your cholesterol and blood pressure. This information is not intended to replace advice given to you by your health care provider. Make sure you discuss any questions you have with your health care provider. Document Revised: 05/08/2021 Document Reviewed: 05/08/2021 Elsevier Patient Education  [place].

## 2022-10-24 ENCOUNTER — Encounter: Payer: Self-pay | Admitting: Internal Medicine

## 2022-11-09 ENCOUNTER — Encounter: Payer: Medicare Other | Admitting: Pharmacist

## 2022-12-12 ENCOUNTER — Ambulatory Visit: Payer: Medicare Other

## 2023-01-03 ENCOUNTER — Other Ambulatory Visit: Payer: Self-pay | Admitting: Internal Medicine

## 2023-01-03 DIAGNOSIS — I10 Essential (primary) hypertension: Secondary | ICD-10-CM

## 2023-01-11 ENCOUNTER — Other Ambulatory Visit: Payer: Self-pay | Admitting: Internal Medicine

## 2023-01-11 DIAGNOSIS — E1159 Type 2 diabetes mellitus with other circulatory complications: Secondary | ICD-10-CM

## 2023-01-11 MED ORDER — VALSARTAN 160 MG PO TABS: 80.0000 mg | ORAL_TABLET | Freq: Every day | ORAL | 0 refills | Status: DC

## 2023-01-22 ENCOUNTER — Other Ambulatory Visit: Payer: Self-pay | Admitting: Internal Medicine

## 2023-01-22 DIAGNOSIS — M545 Low back pain, unspecified: Secondary | ICD-10-CM

## 2023-01-22 DIAGNOSIS — I10 Essential (primary) hypertension: Secondary | ICD-10-CM

## 2023-01-22 NOTE — Telephone Encounter (Signed)
Requested medications are due for refill today.  Unsure  Requested medications are on the active medications list.  yes  Last refill. 10/08/2022 #30 1 rf  Future visit scheduled.   yes  Notes to clinic.  Refill not delegated.    Requested Prescriptions  Pending Prescriptions Disp Refills   methocarbamol (ROBAXIN) 500 MG tablet [Pharmacy Med Name: METHOCARBAMOL 500 MG TABLET] 30 tablet 1    Sig: TAKE 1 TABLET (500 MG TOTAL) BY MOUTH 2 (TWO) TIMES DAILY AS NEEDED FOR MUSCLE SPASMS.     Not Delegated - Analgesics:  Muscle Relaxants Failed - 01/22/2023  1:23 PM      Failed - This refill cannot be delegated      Passed - Valid encounter within last 6 months    Recent Outpatient Visits           3 months ago Encounter for [employer] annual wellness exam   [place] Karle Plumber B, MD   3 months ago Type 2 diabetes mellitus with morbid obesity [facility])   [place] Karle Plumber B, MD   7 months ago Type 2 diabetes mellitus with morbid obesity [facility])   [place] Karle Plumber B, MD   11 months ago Type 2 diabetes mellitus with morbid obesity [facility])   [place] Ladell Pier, MD   1 year ago Pap smear for cervical cancer screening   [place], MD       Future Appointments             In 2 weeks Ladell Pier, MD [place]            Refused Prescriptions Disp Refills   valsartan (DIOVAN) 40 MG tablet [Pharmacy Med Name: VALSARTAN 40 MG TABLET] 90 tablet 2    Sig: TAKE 1 TABLET BY MOUTH EVERY DAY     Cardiovascular:  Angiotensin Receptor Blockers Failed - 01/22/2023  1:23 PM      Failed - K in normal range and within 180 days    Potassium  Date Value Ref Range Status  09/18/2022 3.4 (L) 3.5 - 5.1 mmol/L Final          Failed - Last BP in normal range    BP Readings from Last 1 Encounters:  10/08/22 (!) 143/84         Passed - Cr in normal range and within 180 days    Creat  Date Value Ref Range Status  06/13/2016 0.59 0.50 - 1.10 mg/dL Final   Creatinine, Ser  Date Value Ref Range Status  09/18/2022 0.58 0.44 - 1.00 mg/dL Final         Passed - Patient is not pregnant      Passed - Valid encounter within last 6 months    Recent Outpatient Visits           3 months ago Encounter for [employer] annual wellness exam   [place] Karle Plumber B, MD   3 months ago Type 2 diabetes mellitus with morbid obesity [facility])   [place] Karle Plumber B, MD   7 months ago Type 2 diabetes mellitus with morbid obesity [facility])   [place] Ladell Pier, MD  11 months ago Type 2 diabetes mellitus with morbid obesity [facility])   [place] Ladell Pier, MD   1 year ago Pap smear for cervical cancer screening   [place], MD       Future Appointments             In 2 weeks Ladell Pier, MD [place]

## 2023-01-22 NOTE — Telephone Encounter (Signed)
Dosage changed. Requested Prescriptions  Pending Prescriptions Disp Refills   valsartan (DIOVAN) 40 MG tablet [Pharmacy Med Name: VALSARTAN 40 MG TABLET] 90 tablet 2    Sig: TAKE 1 TABLET BY MOUTH EVERY DAY     Cardiovascular:  Angiotensin Receptor Blockers Failed - 01/22/2023  1:23 PM      Failed - K in normal range and within 180 days    Potassium  Date Value Ref Range Status  09/18/2022 3.4 (L) 3.5 - 5.1 mmol/L Final         Failed - Last BP in normal range    BP Readings from Last 1 Encounters:  10/08/22 (!) 143/84         Passed - Cr in normal range and within 180 days    Creat  Date Value Ref Range Status  06/13/2016 0.59 0.50 - 1.10 mg/dL Final   Creatinine, Ser  Date Value Ref Range Status  09/18/2022 0.58 0.44 - 1.00 mg/dL Final         Passed - Patient is not pregnant      Passed - Valid encounter within last 6 months    Recent Outpatient Visits           3 months ago Encounter for [employer] annual wellness exam   [place] Karle Plumber B, MD   3 months ago Type 2 diabetes mellitus with morbid obesity [facility])   [place] Karle Plumber B, MD   7 months ago Type 2 diabetes mellitus with morbid obesity [facility])   [place] Karle Plumber B, MD   11 months ago Type 2 diabetes mellitus with morbid obesity [facility])   [place] Ladell Pier, MD   1 year ago Pap smear for cervical cancer screening   [place], MD       Future Appointments             In 2 weeks Ladell Pier, MD [place]             methocarbamol (ROBAXIN) 500 MG tablet [Pharmacy Med Name: METHOCARBAMOL 500 MG TABLET] 30 tablet 1    Sig: TAKE 1 TABLET (500 MG TOTAL) BY MOUTH 2 (TWO) TIMES DAILY AS NEEDED FOR MUSCLE SPASMS.     Not Delegated  - Analgesics:  Muscle Relaxants Failed - 01/22/2023  1:23 PM      Failed - This refill cannot be delegated      Passed - Valid encounter within last 6 months    Recent Outpatient Visits           3 months ago Encounter for Medicare annual wellness exam   [place] Karle Plumber B, MD   3 months ago Type 2 diabetes mellitus with morbid obesity [facility])   [place] Karle Plumber B, MD   7 months ago Type 2 diabetes mellitus with morbid obesity [facility])   [place] Karle Plumber B, MD   11 months ago Type 2 diabetes mellitus with morbid obesity [facility])   [place] Ladell Pier, MD   1 year ago Pap smear for cervical cancer screening   [place] Ladell Pier, MD  Future Appointments             In 2 weeks Ladell Pier, MD [place]

## 2023-02-06 ENCOUNTER — Ambulatory Visit: Payer: 59

## 2023-02-08 ENCOUNTER — Ambulatory Visit: Payer: Medicare Other | Admitting: Internal Medicine

## 2023-02-11 ENCOUNTER — Ambulatory Visit: Payer: 59 | Admitting: Internal Medicine

## 2023-02-17 ENCOUNTER — Other Ambulatory Visit: Payer: Self-pay | Admitting: Internal Medicine

## 2023-02-18 NOTE — Telephone Encounter (Signed)
Requested medication (s) are due for refill today - yes  Requested medication (s) are on the active medication list -yes  Future visit scheduled -yes  Last refill: 01/11/23 #15  Notes to clinic: patient did not keep appointment- RF has notes- sent for review   Requested Prescriptions  Pending Prescriptions Disp Refills   valsartan (DIOVAN) 160 MG tablet [Pharmacy Med Name: VALSARTAN 160 MG TABLET] 45 tablet 1    Sig: Take 0.5 tablets (80 mg total) by mouth daily.     Cardiovascular:  Angiotensin Receptor Blockers Failed - 02/17/2023  9:32 AM      Failed - K in normal range and within 180 days    Potassium  Date Value Ref Range Status  09/18/2022 3.4 (L) 3.5 - 5.1 mmol/L Final         Failed - Last BP in normal range    BP Readings from Last 1 Encounters:  10/08/22 (!) 143/84         Passed - Cr in normal range and within 180 days    Creat  Date Value Ref Range Status  06/13/2016 0.59 0.50 - 1.10 mg/dL Final   Creatinine, Ser  Date Value Ref Range Status  09/18/2022 0.58 0.44 - 1.00 mg/dL Final         Passed - Patient is not pregnant      Passed - Valid encounter within last 6 months    Recent Outpatient Visits           4 months ago Encounter for [employer] annual wellness exam   [place] Karle Plumber B, MD   4 months ago Type 2 diabetes mellitus with morbid obesity [facility])   [place] Karle Plumber B, MD   8 months ago Type 2 diabetes mellitus with morbid obesity [facility])   [place] Karle Plumber B, MD   1 year ago Type 2 diabetes mellitus with morbid obesity [facility])   [place] Ladell Pier, MD   1 year ago Pap smear for cervical cancer screening   [place], [place]       Future Appointments             In 3 months Ladell Pier, MD [place]               Requested Prescriptions  Pending Prescriptions Disp Refills   valsartan (DIOVAN) 160 MG tablet [Pharmacy Med Name: VALSARTAN 160 MG TABLET] 45 tablet 1    Sig: Take 0.5 tablets (80 mg total) by mouth daily.     Cardiovascular:  Angiotensin Receptor Blockers Failed - 02/17/2023  9:32 AM      Failed - K in normal range and within 180 days    Potassium  Date Value Ref Range Status  09/18/2022 3.4 (L) 3.5 - 5.1 mmol/L Final         Failed - Last BP in normal range    BP Readings from Last 1 Encounters:  10/08/22 (!) 143/84         Passed - Cr in normal range and within 180 days    Creat  Date Value Ref Range Status  06/13/2016 0.59 0.50 - 1.10 mg/dL Final   Creatinine, Ser  Date Value Ref Range Status  09/18/2022 0.58 0.44 - 1.00 mg/dL Final  Passed - Patient is not pregnant      Passed - Valid encounter within last 6 months    Recent Outpatient Visits           4 months ago Encounter for [employer] annual wellness exam   [place] Karle Plumber B, MD   4 months ago Type 2 diabetes mellitus with morbid obesity [facility])   [place] Karle Plumber B, MD   8 months ago Type 2 diabetes mellitus with morbid obesity [facility]   [place] Karle Plumber B, MD   1 year ago Type 2 diabetes mellitus with morbid obesity [facility])   [place] Ladell Pier, MD   1 year ago Pap smear for cervical cancer screening   [place], MD       Future Appointments             In 3 months Wynetta Emery Dalbert Batman, MD [place]

## 2023-03-18 DIAGNOSIS — M961 Postlaminectomy syndrome, not elsewhere classified: Secondary | ICD-10-CM | POA: Diagnosis not present

## 2023-03-18 DIAGNOSIS — M5451 Vertebrogenic low back pain: Secondary | ICD-10-CM | POA: Diagnosis not present

## 2023-03-18 DIAGNOSIS — Z9889 Other specified postprocedural states: Secondary | ICD-10-CM | POA: Diagnosis not present

## 2023-03-18 DIAGNOSIS — M47896 Other spondylosis, lumbar region: Secondary | ICD-10-CM | POA: Diagnosis not present

## 2023-03-18 DIAGNOSIS — M1711 Unilateral primary osteoarthritis, right knee: Secondary | ICD-10-CM | POA: Diagnosis not present

## 2023-03-18 DIAGNOSIS — E119 Type 2 diabetes mellitus without complications: Secondary | ICD-10-CM | POA: Diagnosis not present

## 2023-03-28 DIAGNOSIS — M1711 Unilateral primary osteoarthritis, right knee: Secondary | ICD-10-CM | POA: Diagnosis not present

## 2023-05-05 ENCOUNTER — Other Ambulatory Visit: Payer: Self-pay | Admitting: Internal Medicine

## 2023-05-05 DIAGNOSIS — G8929 Other chronic pain: Secondary | ICD-10-CM

## 2023-05-23 ENCOUNTER — Ambulatory Visit: Payer: 59 | Admitting: Internal Medicine

## 2023-06-05 ENCOUNTER — Encounter: Payer: Self-pay | Admitting: Physician Assistant

## 2023-06-05 ENCOUNTER — Other Ambulatory Visit: Payer: Self-pay | Admitting: Internal Medicine

## 2023-06-05 ENCOUNTER — Ambulatory Visit: Payer: 59 | Attending: Internal Medicine | Admitting: Physician Assistant

## 2023-06-05 VITALS — BP 144/88 | HR 74 | Ht 67.0 in | Wt 245.2 lb

## 2023-06-05 DIAGNOSIS — Z7984 Long term (current) use of oral hypoglycemic drugs: Secondary | ICD-10-CM

## 2023-06-05 DIAGNOSIS — E1169 Type 2 diabetes mellitus with other specified complication: Secondary | ICD-10-CM

## 2023-06-05 DIAGNOSIS — I10 Essential (primary) hypertension: Secondary | ICD-10-CM

## 2023-06-05 DIAGNOSIS — E785 Hyperlipidemia, unspecified: Secondary | ICD-10-CM

## 2023-06-05 DIAGNOSIS — M545 Low back pain, unspecified: Secondary | ICD-10-CM

## 2023-06-05 DIAGNOSIS — M1711 Unilateral primary osteoarthritis, right knee: Secondary | ICD-10-CM

## 2023-06-05 DIAGNOSIS — G8929 Other chronic pain: Secondary | ICD-10-CM

## 2023-06-05 DIAGNOSIS — E669 Obesity, unspecified: Secondary | ICD-10-CM

## 2023-06-05 LAB — GLUCOSE, POCT (MANUAL RESULT ENTRY): POC Glucose: 168 mg/dl — AB (ref 70–99)

## 2023-06-05 LAB — POCT GLYCOSYLATED HEMOGLOBIN (HGB A1C): HbA1c, POC (controlled diabetic range): 6.7 % (ref 0.0–7.0)

## 2023-06-05 MED ORDER — METHOCARBAMOL 500 MG PO TABS
500.0000 mg | ORAL_TABLET | Freq: Two times a day (BID) | ORAL | 0 refills | Status: DC | PRN
Start: 2023-06-05 — End: 2023-08-16

## 2023-06-05 MED ORDER — MELOXICAM 15 MG PO TABS: 15.0000 mg | ORAL_TABLET | Freq: Every day | ORAL | 4 refills | Status: DC

## 2023-06-05 MED ORDER — GLIMEPIRIDE 2 MG PO TABS: 2.0000 mg | ORAL_TABLET | Freq: Every day | ORAL | 4 refills | Status: DC

## 2023-06-05 MED ORDER — ATORVASTATIN CALCIUM 20 MG PO TABS
20.0000 mg | ORAL_TABLET | Freq: Every day | ORAL | 3 refills | Status: DC
Start: 2023-06-05 — End: 2024-03-25

## 2023-06-05 MED ORDER — VALSARTAN 160 MG PO TABS
ORAL_TABLET | ORAL | 3 refills | Status: DC
Start: 2023-06-05 — End: 2023-08-16

## 2023-06-05 MED ORDER — AMLODIPINE BESYLATE 10 MG PO TABS
10.0000 mg | ORAL_TABLET | Freq: Every day | ORAL | 6 refills | Status: DC
Start: 2023-06-05 — End: 2023-12-11

## 2023-06-05 NOTE — Progress Notes (Signed)
Patient ID: Karen Tanner, female   DOB: 1971-01-05, 52 y.o.   MRN: [id number]   Karen Tanner, is a 52 y.o. female  [id number]  [id number]  DOB - July 13, 1971  Chief Complaint  Patient presents with   Diabetes   Hypertension       Subjective:   Karen Tanner is a 52 y.o. female here today for med RF.  She has been working on weight loss and still taking amaryl.  She is not checking blood sugars.  She has not been taking amlodipine.  She says she thought she was only supposed to be taking the valsartan.  She wants referral to pain clinic for chronic low back pain and R knee pain.  She does have an injection scheduled tomorrow for her R knee.    No problems updated.  ALLERGIES: No Known Allergies  PAST MEDICAL HISTORY: Past Medical History:  Diagnosis Date   Anemia    Arthritis    knee   Diabetes mellitus without complication (HCC)    Hypertension    Hypertensive retinopathy     MEDICATIONS AT HOME: Prior to Admission medications   Medication Sig Start Date End Date Taking? Authorizing Provider  Accu-Chek Softclix Lancets lancets Use to check blood sugar once daily. Dx: E11.69 Patient not taking: Reported on 06/05/2023 06/01/22   Marcine Matar, MD  amLODipine (NORVASC) 10 MG tablet Take 1 tablet (10 mg total) by mouth daily. 06/05/23   Anders Simmonds, PA-C  atorvastatin (LIPITOR) 20 MG tablet Take 1 tablet (20 mg total) by mouth daily. 06/05/23   Anders Simmonds, PA-C  Blood Glucose Monitoring Suppl (ACCU-CHEK GUIDE) w/Device KIT Use to check blood sugar once daily. Dx: E11.69 Patient not taking: Reported on 06/05/2023 04/11/22   Marcine Matar, MD  Blood Pressure Monitor KIT Use to check blood pressure daily Patient not taking: Reported on 06/05/2023 04/23/22   Marcine Matar, MD  gabapentin (NEURONTIN) 300 MG capsule Take 300 mg by mouth 2 (two) times daily. Patient not taking: Reported on 06/05/2023 01/11/22   [provider]  gabapentin  (NEURONTIN) 300 MG capsule gabapentin 300 mg capsule  Take 1 capsule twice a day by oral route as needed for 30 days. Patient not taking: Reported on 06/05/2023    [provider]  glimepiride (AMARYL) 2 MG tablet Take 1 tablet (2 mg total) by mouth daily before breakfast. 06/05/23   Anders Simmonds, PA-C  glucose blood (ACCU-CHEK GUIDE) test strip Use to check blood sugar once daily. Dx: E11.69 Patient not taking: Reported on 06/05/2023 04/11/22   Marcine Matar, MD  meloxicam (MOBIC) 15 MG tablet Take 1 tablet (15 mg total) by mouth daily. 06/05/23   Anders Simmonds, PA-C  methocarbamol (ROBAXIN) 500 MG tablet Take 1 tablet (500 mg total) by mouth 2 (two) times daily as needed for muscle spasms. 06/05/23   Anders Simmonds, PA-C  Misc. Devices MISC Blood pressure device Patient not taking: Reported on 06/05/2023 04/11/22   Marcine Matar, MD  nicotine (NICODERM CQ - DOSED IN MG/24 HOURS) 21 mg/24hr patch Place 1 patch (21 mg total) onto the skin daily. Patient not taking: Reported on 06/05/2023 10/08/22   Marcine Matar, MD  valsartan (DIOVAN) 160 MG tablet TAKE 1/2 TABLET (80 MG TOTAL) BY MOUTH DAILY. 06/05/23   Garvin Ellena, Marzella Schlein, PA-C    ROS: Neg HEENT Neg resp Neg cardiac Neg GI Neg GU Neg psych Neg neuro  Objective:  Vitals:   06/05/23 1417 06/05/23 1440  BP: (!) 154/87 (!) 144/88  Pulse: 74   SpO2: 98%   Weight: 245 lb 3.2 oz (111.2 kg)   Height: 5\' 7"  (1.702 m)    Exam General appearance : Awake, alert, not in any distress. Speech Clear. Not toxic looking HEENT: Atraumatic and Normocephalic Neck: Supple, no JVD. No cervical lymphadenopathy.  Chest: Good air entry bilaterally, CTAB.  No rales/rhonchi/wheezing CVS: S1 S2 regular, no murmurs.  Extremities: B/L Lower Ext shows no edema, both legs are warm to touch Neurology: Awake alert, and oriented X 3, CN II-XII intact, Non focal Skin: No Rash  Data Review Lab Results  Component Value Date   HGBA1C 6.7  06/05/2023   HGBA1C 7.0 10/08/2022   HGBA1C 6.8 06/01/2022    Assessment & Plan   1. Type 2 diabetes mellitus with obesity (HCC) Improved-continue 2mg  amaryl and continue to work on improved diet and increased exercise - Glucose (CBG) - HgB A1c  2. Essential hypertension Not at goal but was not taking amlodipine(per Dr Henriette Combs last note, she should be takin 80mg  valsartan and amlodipine - amLODipine (NORVASC) 10 MG tablet; Take 1 tablet (10 mg total) by mouth daily.  Dispense: 30 tablet; Refill: 6 - valsartan (DIOVAN) 160 MG tablet; TAKE 1/2 TABLET (80 MG TOTAL) BY MOUTH DAILY.  Dispense: 15 tablet; Refill: 3 - Comprehensive metabolic panel  3. Type 2 diabetes mellitus with morbid obesity (HCC) See #1 - glimepiride (AMARYL) 2 MG tablet; Take 1 tablet (2 mg total) by mouth daily before breakfast.  Dispense: 30 tablet; Refill: 4  4. Chronic bilateral low back pain, unspecified whether sciatica present - methocarbamol (ROBAXIN) 500 MG tablet; Take 1 tablet (500 mg total) by mouth 2 (two) times daily as needed for muscle spasms.  Dispense: 60 tablet; Refill: 0 - meloxicam (MOBIC) 15 MG tablet; Take 1 tablet (15 mg total) by mouth daily.  Dispense: 30 tablet; Refill: 4 - Ambulatory referral to Pain Clinic  5. Primary osteoarthritis of right knee - meloxicam (MOBIC) 15 MG tablet; Take 1 tablet (15 mg total) by mouth daily.  Dispense: 30 tablet; Refill: 4  6. Hyperlipidemia associated with type 2 diabetes mellitus (HCC) - atorvastatin (LIPITOR) 20 MG tablet; Take 1 tablet (20 mg total) by mouth daily.  Dispense: 90 tablet; Refill: 3  7. Osteoarthritis of right knee, unspecified osteoarthritis type See #4    Return in about 3 months (around 09/05/2023) for PCP for chronic conditions.  The patient was given clear instructions to go to ER or return to medical center if symptoms don't improve, worsen or new problems develop. The patient verbalized understanding. The patient was told to  call to get lab results if they haven't heard anything in the next week.      Georgian Co, PA-C [facility] [place], [place] [phone]   06/05/2023, 2:44 PM

## 2023-06-05 NOTE — Progress Notes (Signed)
Burning sensation in both feet.

## 2023-06-06 DIAGNOSIS — M1711 Unilateral primary osteoarthritis, right knee: Secondary | ICD-10-CM | POA: Diagnosis not present

## 2023-06-06 LAB — COMPREHENSIVE METABOLIC PANEL
ALT: 25 IU/L (ref 0–32)
AST: 32 IU/L (ref 0–40)
Albumin/Globulin Ratio: 1.6 (ref 1.2–2.2)
Albumin: 4.5 g/dL (ref 3.8–4.9)
Alkaline Phosphatase: 93 IU/L (ref 44–121)
BUN/Creatinine Ratio: 22 (ref 9–23)
BUN: 20 mg/dL (ref 6–24)
Bilirubin Total: 0.4 mg/dL (ref 0.0–1.2)
CO2: 26 mmol/L (ref 20–29)
Calcium: 9.8 mg/dL (ref 8.7–10.2)
Chloride: 101 mmol/L (ref 96–106)
Creatinine, Ser: 0.92 mg/dL (ref 0.57–1.00)
Globulin, Total: 2.9 g/dL (ref 1.5–4.5)
Glucose: 113 mg/dL — ABNORMAL HIGH (ref 70–99)
Potassium: 4.7 mmol/L (ref 3.5–5.2)
Sodium: 140 mmol/L (ref 134–144)
Total Protein: 7.4 g/dL (ref 6.0–8.5)
eGFR: 74 mL/min/{1.73_m2} (ref >59)

## 2023-06-19 DIAGNOSIS — M129 Arthropathy, unspecified: Secondary | ICD-10-CM | POA: Diagnosis not present

## 2023-06-19 DIAGNOSIS — E1169 Type 2 diabetes mellitus with other specified complication: Secondary | ICD-10-CM | POA: Diagnosis not present

## 2023-06-19 DIAGNOSIS — G894 Chronic pain syndrome: Secondary | ICD-10-CM | POA: Diagnosis not present

## 2023-06-19 DIAGNOSIS — Z79899 Other long term (current) drug therapy: Secondary | ICD-10-CM | POA: Diagnosis not present

## 2023-06-19 DIAGNOSIS — R030 Elevated blood-pressure reading, without diagnosis of hypertension: Secondary | ICD-10-CM | POA: Diagnosis not present

## 2023-06-19 DIAGNOSIS — M545 Low back pain, unspecified: Secondary | ICD-10-CM | POA: Diagnosis not present

## 2023-06-19 DIAGNOSIS — E559 Vitamin D deficiency, unspecified: Secondary | ICD-10-CM | POA: Diagnosis not present

## 2023-06-19 LAB — LAB REPORT - SCANNED
Creatinine, POC: 187.2 mg/dL
EGFR: 132

## 2023-06-25 DIAGNOSIS — L905 Scar conditions and fibrosis of skin: Secondary | ICD-10-CM | POA: Diagnosis not present

## 2023-07-03 ENCOUNTER — Encounter: Payer: Self-pay | Admitting: Internal Medicine

## 2023-07-08 NOTE — Patient Instructions (Incomplete)
Karen Tanner , Thank you for taking time to come for your Medicare Wellness Visit. I appreciate your ongoing commitment to your health goals. Please review the following plan we discussed and let me know if I can assist you in the future.   These are the goals we discussed:  Goals   None     This is a list of the screening recommended for you and due dates:  Health Maintenance  Topic Date Due   COVID-19 Vaccine (1) Never done   Complete foot exam   Never done   Hepatitis C Screening  Never done   Colon Cancer Screening  Never done   Eye exam for diabetics  08/03/2022   Zoster (Shingles) Vaccine (1 of 2) 10/09/2023*   Flu Shot  08/01/2023   Yearly kidney health urinalysis for diabetes  10/09/2023   Medicare Annual Wellness Visit  10/20/2023   Mammogram  12/02/2023   Hemoglobin A1C  12/05/2023   Yearly kidney function blood test for diabetes  06/18/2024   Pap Smear  12/05/2024   DTaP/Tdap/Td vaccine (2 - Td or Tdap) 01/17/2026   HIV Screening  Completed   HPV Vaccine  Aged Out  *Topic was postponed. The date shown is not the original due date.    Advanced directives: Information on Advanced Care Planning can be found at [facility] Advance Health Care Directives Advance Health Care Directives ([email or web address] Please bring a copy of your health care power of attorney and living will to the office to be added to your chart at your convenience.  Conditions/risks identified: Aim for 30 minutes of exercise or brisk walking, 6-8 glasses of water, and 5 servings of fruits and vegetables each day.  Next appointment: Follow up in one year for your annual wellness visit.   Preventive Care 40-64 Years, Female Preventive care refers to lifestyle choices and visits with your health care provider that can promote health and wellness. What does preventive care include? A yearly physical exam. This is also called an annual well check. Dental exams once or twice a year. Routine  eye exams. Ask your health care provider how often you should have your eyes checked. Personal lifestyle choices, including: Daily care of your teeth and gums. Regular physical activity. Eating a healthy diet. Avoiding tobacco and drug use. Limiting alcohol use. Practicing safe sex. Taking low-dose aspirin daily starting at age 95. Taking vitamin and mineral supplements as recommended by your health care provider. What happens during an annual well check? The services and screenings done by your health care provider during your annual well check will depend on your age, overall health, lifestyle risk factors, and family history of disease. Counseling  Your health care provider may ask you questions about your: Alcohol use. Tobacco use. Drug use. Emotional well-being. Home and relationship well-being. Sexual activity. Eating habits. Work and work Astr[employer]. Method of birth control. Menstrual cycle. Pregnancy history. Screening  You may have the following tests or measurements: Height, weight, and BMI. Blood pressure. Lipid and cholesterol levels. These may be checked every 5 years, or more frequently if you are over 34 years old. Skin check. Lung cancer screening. You may have this screening every year starting at age 77 if you have a 30-pack-year history of smoking and currently smoke or have quit within the past 15 years. Fecal occult blood test (FOBT) of the stool. You may have this test every year starting at age 22. Flexible sigmoidoscopy or colonoscopy. You may have  a sigmoidoscopy every 5 years or a colonoscopy every 10 years starting at age 90. Hepatitis C blood test. Hepatitis B blood test. Sexually transmitted disease (STD) testing. Diabetes screening. This is done by checking your blood sugar (glucose) after you have not eaten for a while (fasting). You may have this done every 1-3 years. Mammogram. This may be done every 1-2 years. Talk to your health care provider  about when you should start having regular mammograms. This may depend on whether you have a family history of breast cancer. BRCA-related cancer screening. This may be done if you have a family history of breast, ovarian, tubal, or peritoneal cancers. Pelvic exam and Pap test. This may be done every 3 years starting at age 30. Starting at age 47, this may be done every 5 years if you have a Pap test in combination with an HPV test. Bone density scan. This is done to screen for osteoporosis. You may have this scan if you are at high risk for osteoporosis. Discuss your test results, treatment options, and if necessary, the need for more tests with your health care provider. Vaccines  Your health care provider may recommend certain vaccines, such as: Influenza vaccine. This is recommended every year. Tetanus, diphtheria, and acellular pertussis (Tdap, Td) vaccine. You may need a Td booster every 10 years. Zoster vaccine. You may need this after age 68. Pneumococcal 13-valent conjugate (PCV13) vaccine. You may need this if you have certain conditions and were not previously vaccinated. Pneumococcal polysaccharide (PPSV23) vaccine. You may need one or two doses if you smoke cigarettes or if you have certain conditions. Talk to your health care provider about which screenings and vaccines you need and how often you need them. This information is not intended to replace advice given to you by your health care provider. Make sure you discuss any questions you have with your health care provider. Document Released: 01/13/2016 Document Revised: 09/05/2016 Document Reviewed: 10/18/2015 Elsevier Interactive Patient Education  2017 [employer].    Fall Prevention in the Home Falls can cause injuries. They can happen to people of all ages. There are many things you can do to make your home safe and to help prevent falls. What can I do on the outside of my home? Regularly fix the edges of walkways and  driveways and fix any cracks. Remove anything that might make you trip as you walk through a door, such as a raised step or threshold. Trim any bushes or trees on the path to your home. Use bright outdoor lighting. Clear any walking paths of anything that might make someone trip, such as rocks or tools. Regularly check to see if handrails are loose or broken. Make sure that both sides of any steps have handrails. Any raised decks and porches should have guardrails on the edges. Have any leaves, snow, or ice cleared regularly. Use sand or salt on walking paths during winter. Clean up any spills in your garage right away. This includes oil or grease spills. What can I do in the bathroom? Use night lights. Install grab bars by the toilet and in the tub and shower. Do not use towel bars as grab bars. Use non-skid mats or decals in the tub or shower. If you need to sit down in the shower, use a plastic, non-slip stool. Keep the floor dry. Clean up any water that spills on the floor as soon as it happens. Remove soap buildup in the tub or shower regularly. Attach bath  mats securely with double-sided non-slip rug tape. Do not have throw rugs and other things on the floor that can make you trip. What can I do in the bedroom? Use night lights. Make sure that you have a light by your bed that is easy to reach. Do not use any sheets or blankets that are too big for your bed. They should not hang down onto the floor. Have a firm chair that has side arms. You can use this for support while you get dressed. Do not have throw rugs and other things on the floor that can make you trip. What can I do in the kitchen? Clean up any spills right away. Avoid walking on wet floors. Keep items that you use a lot in easy-to-reach places. If you need to reach something above you, use a strong step stool that has a grab bar. Keep electrical cords out of the way. Do not use floor polish or wax that makes floors  slippery. If you must use wax, use non-skid floor wax. Do not have throw rugs and other things on the floor that can make you trip. What can I do with my stairs? Do not leave any items on the stairs. Make sure that there are handrails on both sides of the stairs and use them. Fix handrails that are broken or loose. Make sure that handrails are as long as the stairways. Check any carpeting to make sure that it is firmly attached to the stairs. Fix any carpet that is loose or worn. Avoid having throw rugs at the top or bottom of the stairs. If you do have throw rugs, attach them to the floor with carpet tape. Make sure that you have a light switch at the top of the stairs and the bottom of the stairs. If you do not have them, ask someone to add them for you. What else can I do to help prevent falls? Wear shoes that: Do not have high heels. Have rubber bottoms. Are comfortable and fit you well. Are closed at the toe. Do not wear sandals. If you use a stepladder: Make sure that it is fully opened. Do not climb a closed stepladder. Make sure that both sides of the stepladder are locked into place. Ask someone to hold it for you, if possible. Clearly mark and make sure that you can see: Any grab bars or handrails. First and last steps. Where the edge of each step is. Use tools that help you move around (mobility aids) if they are needed. These include: Canes. Walkers. Scooters. Crutches. Turn on the lights when you go into a dark area. Replace any light bulbs as soon as they burn out. Set up your furniture so you have a clear path. Avoid moving your furniture around. If any of your floors are uneven, fix them. If there are any pets around you, be aware of where they are. Review your medicines with your doctor. Some medicines can make you feel dizzy. This can increase your chance of falling. Ask your doctor what other things that you can do to help prevent falls. This information is not  intended to replace advice given to you by your health care provider. Make sure you discuss any questions you have with your health care provider. Document Released: 10/13/2009 Document Revised: 05/24/2016 Document Reviewed: 01/21/2015 Elsevier Interactive Patient Education  2017 [employer].

## 2023-07-08 NOTE — Progress Notes (Unsigned)
Subjective:   Karen Tanner is a 52 y.o. female who presents for Medicare Annual (Subsequent) preventive examination.  Visit Complete: {[phone]}  Patient Medicare AWV questionnaire was completed by the patient on ***; I have confirmed that all information answered by patient is correct and no changes since this date.  Review of Systems    ***       Objective:    There were no vitals filed for this visit. There is no height or weight on file to calculate BMI.     10/19/2022   11:36 AM 10/19/2022   11:31 AM 03/09/2022    7:45 AM 10/12/2021   10:45 AM 06/13/2016   10:33 AM 03/03/2016   11:33 AM 04/11/2015    4:36 PM  Advanced Directives  Does Patient Have a Medical Advance Directive? No No No No No No No  Does patient want to make changes to medical advance directive? Yes (MAU/Ambulatory/Procedural Areas - Information given)        Would patient like information on creating a medical advance directive?   No - Patient declined  No - patient declined information No - patient declined information Yes - Educational materials given    Current Medications (verified) Outpatient Encounter Medications as of 07/09/2023  Medication Sig   Accu-Chek Softclix Lancets lancets Use to check blood sugar once daily. Dx: E11.69 (Patient not taking: Reported on 06/05/2023)   amLODipine (NORVASC) 10 MG tablet Take 1 tablet (10 mg total) by mouth daily.   atorvastatin (LIPITOR) 20 MG tablet Take 1 tablet (20 mg total) by mouth daily.   Blood Glucose Monitoring Suppl (ACCU-CHEK GUIDE) w/Device KIT Use to check blood sugar once daily. Dx: E11.69 (Patient not taking: Reported on 06/05/2023)   Blood Pressure Monitor KIT Use to check blood pressure daily (Patient not taking: Reported on 06/05/2023)   gabapentin (NEURONTIN) 300 MG capsule Take 300 mg by mouth 2 (two) times daily. (Patient not taking: Reported on 06/05/2023)   gabapentin (NEURONTIN) 300 MG capsule gabapentin 300 mg capsule  Take 1  capsule twice a day by oral route as needed for 30 days. (Patient not taking: Reported on 06/05/2023)   glimepiride (AMARYL) 2 MG tablet Take 1 tablet (2 mg total) by mouth daily before breakfast.   glucose blood (ACCU-CHEK GUIDE) test strip Use to check blood sugar once daily. Dx: E11.69 (Patient not taking: Reported on 06/05/2023)   meloxicam (MOBIC) 15 MG tablet Take 1 tablet (15 mg total) by mouth daily.   methocarbamol (ROBAXIN) 500 MG tablet Take 1 tablet (500 mg total) by mouth 2 (two) times daily as needed for muscle spasms.   Misc. Devices MISC Blood pressure device (Patient not taking: Reported on 06/05/2023)   nicotine (NICODERM CQ - DOSED IN MG/24 HOURS) 21 mg/24hr patch Place 1 patch (21 mg total) onto the skin daily. (Patient not taking: Reported on 06/05/2023)   valsartan (DIOVAN) 160 MG tablet TAKE 1/2 TABLET (80 MG TOTAL) BY MOUTH DAILY.   No facility-administered encounter medications on file as of 07/09/2023.    Allergies (verified) Patient has no known allergies.   History: Past Medical History:  Diagnosis Date   Anemia    Arthritis    knee   Diabetes mellitus without complication (HCC)    Hypertension    Hypertensive retinopathy    Past Surgical History:  Procedure Laterality Date   BACK SURGERY     Bone fusion   BREAST LUMPECTOMY WITH RADIOACTIVE SEED LOCALIZATION Left 03/09/2022  Procedure: LEFT BREAST LUMPECTOMY WITH RADIOACTIVE SEED LOCALIZATION;  Surgeon: Griselda Miner, MD;  Location: [facility];  Service: General;  Laterality: Left;   Family History  Problem Relation Age of Onset   Diabetes Mother    Hyperlipidemia Mother    Heart disease Mother    Diabetes Father    Diabetes Maternal Grandmother    Social History   Socioeconomic History   Marital status: Single    Spouse name: Not on file   Number of children: Not on file   Years of education: Not on file   Highest education level: Not on file  Occupational History   Not on file   Tobacco Use   Smoking status: Every Day    Packs/day: 0.25    Years: 14.00    Additional pack years: 0.00    Total pack years: 3.50    Types: Cigarettes   Smokeless tobacco: Never   Tobacco comments:    Smoking 6-7 cigs per day  Vaping Use   Vaping Use: Never used  Substance and Sexual Activity   Alcohol use: Yes    Comment: 5-6 drinks per day on weekends   Drug use: Yes    Types: Marijuana   Sexual activity: Not on file  Other Topics Concern   Not on file  Social History Narrative   Not on file   Social Determinants of Health   Financial Resource Strain: Low Risk  (10/22/2022)   Overall Financial Resource Strain (CARDIA)    Difficulty of Paying Living Expenses: Not hard at all  Food Insecurity: Food Insecurity Present (10/22/2022)   Hunger Vital Sign    Worried About Running Out of Food in the Last Year: Often true    Ran Out of Food in the Last Year: Often true  Transportation Needs: No Transportation Needs (10/19/2022)   PRAPARE - Administrator, Civil Service (Medical): No    Lack of Transportation (Non-Medical): No  Physical Activity: Insufficiently Active (10/19/2022)   Exercise Vital Sign    Days of Exercise per Week: 2 days    Minutes of Exercise per Session: 30 min  Stress: No Stress Concern Present (10/22/2022)   [employer] of Occupational Health - Occupational Stress Questionnaire    Feeling of Stress : Not at all  Social Connections: Socially Isolated (10/19/2022)   Social Connection and Isolation Panel [NHANES]    Frequency of Communication with Friends and Family: Three times a week    Frequency of Social Gatherings with Friends and Family: Three times a week    Attends Religious Services: Never    Active Member of Clubs or Organizations: No    Attends Banker Meetings: Never    Marital Status: Never married    Tobacco Counseling Ready to quit: Not Answered Counseling given: Not Answered Tobacco comments: Smoking  6-7 cigs per day   Clinical Intake:                        Activities of Daily Living    10/19/2022   11:30 AM  In your present state of health, do you have any difficulty performing the following activities:  Hearing? 0  Vision? 0  Difficulty concentrating or making decisions? 0  Walking or climbing stairs? 1  Dressing or bathing? 0  Doing errands, shopping? 0  Preparing Food and eating ? N  Using the Toilet? N  In the past six months, have you accidently  leaked urine? N  Do you have problems with loss of bowel control? N  Managing your Medications? N  Managing your Finances? N    Patient Care Team: Marcine Matar, MD as PCP - General (Internal Medicine)  Indicate any recent Medical Services you may have received from other than [facility] providers in the past year (date may be approximate).     Assessment:   This is a routine wellness examination for Karen Tanner.  Hearing/Vision screen No results found.  Dietary issues and exercise activities discussed:     Goals Addressed   None    Depression Screen    06/05/2023    2:24 PM 10/19/2022   11:30 AM 06/25/2022    4:45 PM 06/01/2022   12:10 PM 02/01/2022    4:23 PM 12/05/2021    4:20 PM 04/17/2021    4:08 PM  PHQ 2/9 Scores  PHQ - 2 Score 0 0 0 0 0 0 0  PHQ- 9 Score 0   1       Fall Risk    06/05/2023    2:19 PM 10/19/2022   11:30 AM 10/08/2022    9:28 AM 06/25/2022    4:45 PM 02/01/2022    4:22 PM  Fall Risk   Falls in the past year? 0 1 0 0 0  Number falls in past yr: 0 0 0  0  Injury with Fall? 0 0 0  0  Risk for fall due to : No Fall Risks No Fall Risks No Fall Risks  No Fall Risks  Follow up Falls evaluation completed Falls evaluation completed Falls evaluation completed      MEDICARE RISK AT HOME:   TIMED UP AND GO:  Was the test performed?  No    Cognitive Function:        10/19/2022   11:47 AM  6CIT Screen  What Year? 0 points  What month? 0 points  What time? 0 points  Count  back from 20 0 points  Months in reverse 0 points  Repeat phrase 0 points  Total Score 0 points    Immunizations Immunization History  Administered Date(s) Administered   Influenza,inj,Quad PF,6+ Mos 10/26/2014, 09/22/2015, 10/08/2022   Influenza-Unspecified 11/28/2021   PNEUMOCOCCAL CONJUGATE-20 12/05/2021   Tdap 01/18/2016    TDAP status: Up to date  Pneumococcal vaccine status: Up to date  Covid-19 vaccine status: Information provided on how to obtain vaccines.   Qualifies for Shingles Vaccine? Yes   Zostavax completed No   Shingrix Completed?: No.    Education has been provided regarding the importance of this vaccine. Patient has been advised to call insurance company to determine out of pocket expense if they have not yet received this vaccine. Advised may also receive vaccine at local pharmacy or Health Dept. Verbalized acceptance and understanding.  Screening Tests Health Maintenance  Topic Date Due   COVID-19 Vaccine (1) Never done   FOOT EXAM  Never done   Hepatitis C Screening  Never done   Colonoscopy  Never done   OPHTHALMOLOGY EXAM  08/03/2022   Zoster Vaccines- Shingrix (1 of 2) 10/09/2023 (Originally [birth year]/26/1988)   INFLUENZA VACCINE  08/01/2023   Diabetic kidney evaluation - Urine ACR  10/09/2023   Medicare Annual Wellness (AWV)  10/20/2023   MAMMOGRAM  12/02/2023   HEMOGLOBIN A1C  12/05/2023   Diabetic kidney evaluation - eGFR measurement  06/18/2024   PAP SMEAR-Modifier  12/05/2024   DTaP/Tdap/Td (2 - Td or Tdap) 01/17/2026  HIV Screening  Completed   HPV VACCINES  Aged Out    Health Maintenance  Health Maintenance Due  Topic Date Due   COVID-19 Vaccine (1) Never done   FOOT EXAM  Never done   Hepatitis C Screening  Never done   Colonoscopy  Never done   OPHTHALMOLOGY EXAM  08/03/2022    {Colorectal cancer screening:[id number]}  Mammogram status: Ordered 10/08/22. Pt provided with contact info and advised to call to schedule appt.   Lung  Cancer Screening: (Low Dose CT Chest recommended if Age 67-80 years, 20 pack-year currently smoking OR have quit w/in 15years.) does not qualify.   Lung Cancer Screening Referral: n/a  Additional Screening:  Hepatitis C Screening: does qualify  Vision Screening: Recommended annual ophthalmology exams for early detection of glaucoma and other disorders of the eye. Is the patient up to date with their annual eye exam?  {YES/NO:21197} Who is the provider or what is the name of the office in which the patient attends annual eye exams? *** If pt is not established with a provider, would they like to be referred to a provider to establish care? {YES/NO:21197}.   Dental Screening: Recommended annual dental exams for proper oral hygiene  Diabetic Foot Exam: Diabetic Foot Exam: Overdue, Pt has been advised about the importance in completing this exam. Pt is scheduled for diabetic foot exam on at next office visit.  Community Resource Referral / Chronic Care Management: CRR required this visit?  {YES/NO:21197}  CCM required this visit?  {CCM Required choices:[phone]}     Plan:     I have personally reviewed and noted the following in the patient's chart:   Medical and social history Use of alcohol, tobacco or illicit drugs  Current medications and supplements including opioid prescriptions. {Opioid Prescriptions:[phone]} Functional ability and status Nutritional status Physical activity Advanced directives List of other physicians Hospitalizations, surgeries, and ER visits in previous 12 months Vitals Screenings to include cognitive, depression, and falls Referrals and appointments  In addition, I have reviewed and discussed with patient certain preventive protocols, quality metrics, and best practice recommendations. A written personalized care plan for preventive services as well as general preventive health recommendations were provided to patient.     Kandis Fantasia  [place], [place]   [birth year]   After Visit Summary: {CHL AMB AWV After Visit Summary:[phone]}  Nurse Notes: ***

## 2023-07-09 ENCOUNTER — Ambulatory Visit: Payer: 59 | Attending: Internal Medicine

## 2023-07-09 VITALS — Ht 67.0 in | Wt 245.0 lb

## 2023-07-09 DIAGNOSIS — Z0000 Encounter for general adult medical examination without abnormal findings: Secondary | ICD-10-CM

## 2023-07-12 DIAGNOSIS — E559 Vitamin D deficiency, unspecified: Secondary | ICD-10-CM | POA: Diagnosis not present

## 2023-07-12 DIAGNOSIS — M47816 Spondylosis without myelopathy or radiculopathy, lumbar region: Secondary | ICD-10-CM | POA: Diagnosis not present

## 2023-07-12 DIAGNOSIS — E1169 Type 2 diabetes mellitus with other specified complication: Secondary | ICD-10-CM | POA: Diagnosis not present

## 2023-07-12 DIAGNOSIS — Z79899 Other long term (current) drug therapy: Secondary | ICD-10-CM | POA: Diagnosis not present

## 2023-07-12 DIAGNOSIS — M545 Low back pain, unspecified: Secondary | ICD-10-CM | POA: Diagnosis not present

## 2023-07-12 DIAGNOSIS — R030 Elevated blood-pressure reading, without diagnosis of hypertension: Secondary | ICD-10-CM | POA: Diagnosis not present

## 2023-07-12 DIAGNOSIS — G894 Chronic pain syndrome: Secondary | ICD-10-CM | POA: Diagnosis not present

## 2023-07-17 DIAGNOSIS — E1169 Type 2 diabetes mellitus with other specified complication: Secondary | ICD-10-CM | POA: Diagnosis not present

## 2023-07-17 DIAGNOSIS — M545 Low back pain, unspecified: Secondary | ICD-10-CM | POA: Diagnosis not present

## 2023-07-17 DIAGNOSIS — E559 Vitamin D deficiency, unspecified: Secondary | ICD-10-CM | POA: Diagnosis not present

## 2023-07-17 DIAGNOSIS — Z79899 Other long term (current) drug therapy: Secondary | ICD-10-CM | POA: Diagnosis not present

## 2023-07-17 DIAGNOSIS — M47816 Spondylosis without myelopathy or radiculopathy, lumbar region: Secondary | ICD-10-CM | POA: Diagnosis not present

## 2023-07-17 DIAGNOSIS — G894 Chronic pain syndrome: Secondary | ICD-10-CM | POA: Diagnosis not present

## 2023-07-17 DIAGNOSIS — R030 Elevated blood-pressure reading, without diagnosis of hypertension: Secondary | ICD-10-CM | POA: Diagnosis not present

## 2023-07-31 ENCOUNTER — Institutional Professional Consult (permissible substitution): Payer: 59 | Admitting: Plastic Surgery

## 2023-08-05 DIAGNOSIS — R262 Difficulty in walking, not elsewhere classified: Secondary | ICD-10-CM | POA: Diagnosis not present

## 2023-08-05 DIAGNOSIS — M6281 Muscle weakness (generalized): Secondary | ICD-10-CM | POA: Diagnosis not present

## 2023-08-05 DIAGNOSIS — M79672 Pain in left foot: Secondary | ICD-10-CM | POA: Diagnosis not present

## 2023-08-05 DIAGNOSIS — M25561 Pain in right knee: Secondary | ICD-10-CM | POA: Diagnosis not present

## 2023-08-05 DIAGNOSIS — M545 Low back pain, unspecified: Secondary | ICD-10-CM | POA: Diagnosis not present

## 2023-08-09 DIAGNOSIS — R030 Elevated blood-pressure reading, without diagnosis of hypertension: Secondary | ICD-10-CM | POA: Diagnosis not present

## 2023-08-09 DIAGNOSIS — G894 Chronic pain syndrome: Secondary | ICD-10-CM | POA: Diagnosis not present

## 2023-08-09 DIAGNOSIS — Z79899 Other long term (current) drug therapy: Secondary | ICD-10-CM | POA: Diagnosis not present

## 2023-08-09 DIAGNOSIS — M545 Low back pain, unspecified: Secondary | ICD-10-CM | POA: Diagnosis not present

## 2023-08-09 DIAGNOSIS — E1169 Type 2 diabetes mellitus with other specified complication: Secondary | ICD-10-CM | POA: Diagnosis not present

## 2023-08-09 DIAGNOSIS — M47816 Spondylosis without myelopathy or radiculopathy, lumbar region: Secondary | ICD-10-CM | POA: Diagnosis not present

## 2023-08-09 DIAGNOSIS — E559 Vitamin D deficiency, unspecified: Secondary | ICD-10-CM | POA: Diagnosis not present

## 2023-08-12 DIAGNOSIS — M25561 Pain in right knee: Secondary | ICD-10-CM | POA: Diagnosis not present

## 2023-08-12 DIAGNOSIS — M545 Low back pain, unspecified: Secondary | ICD-10-CM | POA: Diagnosis not present

## 2023-08-12 DIAGNOSIS — M6281 Muscle weakness (generalized): Secondary | ICD-10-CM | POA: Diagnosis not present

## 2023-08-12 DIAGNOSIS — R262 Difficulty in walking, not elsewhere classified: Secondary | ICD-10-CM | POA: Diagnosis not present

## 2023-08-12 DIAGNOSIS — M79672 Pain in left foot: Secondary | ICD-10-CM | POA: Diagnosis not present

## 2023-08-14 DIAGNOSIS — R262 Difficulty in walking, not elsewhere classified: Secondary | ICD-10-CM | POA: Diagnosis not present

## 2023-08-14 DIAGNOSIS — M79672 Pain in left foot: Secondary | ICD-10-CM | POA: Diagnosis not present

## 2023-08-14 DIAGNOSIS — M6281 Muscle weakness (generalized): Secondary | ICD-10-CM | POA: Diagnosis not present

## 2023-08-14 DIAGNOSIS — M545 Low back pain, unspecified: Secondary | ICD-10-CM | POA: Diagnosis not present

## 2023-08-14 DIAGNOSIS — M25561 Pain in right knee: Secondary | ICD-10-CM | POA: Diagnosis not present

## 2023-08-16 ENCOUNTER — Telehealth: Payer: Self-pay | Admitting: Internal Medicine

## 2023-08-16 ENCOUNTER — Other Ambulatory Visit: Payer: Self-pay

## 2023-08-16 ENCOUNTER — Other Ambulatory Visit: Payer: Self-pay | Admitting: Physician Assistant

## 2023-08-16 DIAGNOSIS — M545 Low back pain, unspecified: Secondary | ICD-10-CM

## 2023-08-16 DIAGNOSIS — I10 Essential (primary) hypertension: Secondary | ICD-10-CM

## 2023-08-16 DIAGNOSIS — Z79899 Other long term (current) drug therapy: Secondary | ICD-10-CM | POA: Diagnosis not present

## 2023-08-16 NOTE — Telephone Encounter (Signed)
Copied from CRM [id number]. Topic: General - Other >> Aug 16, 2023  3:31 PM Phill Myron wrote: Attn: Vic Blackbird, RN Ms. Gilberti ask that you call her back, she apologizes she missed your call, she was in the shower.

## 2023-08-16 NOTE — Telephone Encounter (Signed)
Pt is calling because she was told by the Pharmacy that a PA was needed for the medication gabapentin (NEURONTIN) 300 MG capsule [[id number]]

## 2023-08-19 DIAGNOSIS — M6281 Muscle weakness (generalized): Secondary | ICD-10-CM | POA: Diagnosis not present

## 2023-08-19 DIAGNOSIS — M79672 Pain in left foot: Secondary | ICD-10-CM | POA: Diagnosis not present

## 2023-08-19 DIAGNOSIS — R262 Difficulty in walking, not elsewhere classified: Secondary | ICD-10-CM | POA: Diagnosis not present

## 2023-08-19 DIAGNOSIS — M25561 Pain in right knee: Secondary | ICD-10-CM | POA: Diagnosis not present

## 2023-08-19 DIAGNOSIS — M545 Low back pain, unspecified: Secondary | ICD-10-CM | POA: Diagnosis not present

## 2023-08-20 NOTE — Telephone Encounter (Signed)
Dr. Smitty Cords with [facility]  filled  Rx.  Informed no PA is needed, per previous message.  Advised to f/u with prescribe provider.  Patient verbalized understanding.

## 2023-08-21 DIAGNOSIS — M6281 Muscle weakness (generalized): Secondary | ICD-10-CM | POA: Diagnosis not present

## 2023-08-21 DIAGNOSIS — M79672 Pain in left foot: Secondary | ICD-10-CM | POA: Diagnosis not present

## 2023-08-21 DIAGNOSIS — M545 Low back pain, unspecified: Secondary | ICD-10-CM | POA: Diagnosis not present

## 2023-08-21 DIAGNOSIS — M25561 Pain in right knee: Secondary | ICD-10-CM | POA: Diagnosis not present

## 2023-08-21 DIAGNOSIS — R262 Difficulty in walking, not elsewhere classified: Secondary | ICD-10-CM | POA: Diagnosis not present

## 2023-08-26 DIAGNOSIS — M545 Low back pain, unspecified: Secondary | ICD-10-CM | POA: Diagnosis not present

## 2023-08-26 DIAGNOSIS — M6281 Muscle weakness (generalized): Secondary | ICD-10-CM | POA: Diagnosis not present

## 2023-08-26 DIAGNOSIS — R262 Difficulty in walking, not elsewhere classified: Secondary | ICD-10-CM | POA: Diagnosis not present

## 2023-08-26 DIAGNOSIS — M25561 Pain in right knee: Secondary | ICD-10-CM | POA: Diagnosis not present

## 2023-08-26 DIAGNOSIS — M79672 Pain in left foot: Secondary | ICD-10-CM | POA: Diagnosis not present

## 2023-09-04 DIAGNOSIS — M25561 Pain in right knee: Secondary | ICD-10-CM | POA: Diagnosis not present

## 2023-09-04 DIAGNOSIS — M545 Low back pain, unspecified: Secondary | ICD-10-CM | POA: Diagnosis not present

## 2023-09-04 DIAGNOSIS — R262 Difficulty in walking, not elsewhere classified: Secondary | ICD-10-CM | POA: Diagnosis not present

## 2023-09-04 DIAGNOSIS — M6281 Muscle weakness (generalized): Secondary | ICD-10-CM | POA: Diagnosis not present

## 2023-09-04 DIAGNOSIS — M79672 Pain in left foot: Secondary | ICD-10-CM | POA: Diagnosis not present

## 2023-09-05 ENCOUNTER — Ambulatory Visit: Payer: 59 | Attending: Internal Medicine | Admitting: Internal Medicine

## 2023-09-05 ENCOUNTER — Encounter: Payer: Self-pay | Admitting: Internal Medicine

## 2023-09-05 VITALS — BP 167/78 | HR 62 | Temp 97.8°F | Ht 67.0 in | Wt 245.0 lb

## 2023-09-05 DIAGNOSIS — Z1211 Encounter for screening for malignant neoplasm of colon: Secondary | ICD-10-CM

## 2023-09-05 DIAGNOSIS — E119 Type 2 diabetes mellitus without complications: Secondary | ICD-10-CM | POA: Diagnosis not present

## 2023-09-05 DIAGNOSIS — Z1231 Encounter for screening mammogram for malignant neoplasm of breast: Secondary | ICD-10-CM

## 2023-09-05 DIAGNOSIS — E785 Hyperlipidemia, unspecified: Secondary | ICD-10-CM | POA: Diagnosis not present

## 2023-09-05 DIAGNOSIS — F17200 Nicotine dependence, unspecified, uncomplicated: Secondary | ICD-10-CM

## 2023-09-05 DIAGNOSIS — E1159 Type 2 diabetes mellitus with other circulatory complications: Secondary | ICD-10-CM | POA: Diagnosis not present

## 2023-09-05 DIAGNOSIS — Z23 Encounter for immunization: Secondary | ICD-10-CM | POA: Diagnosis not present

## 2023-09-05 DIAGNOSIS — E1142 Type 2 diabetes mellitus with diabetic polyneuropathy: Secondary | ICD-10-CM

## 2023-09-05 DIAGNOSIS — I152 Hypertension secondary to endocrine disorders: Secondary | ICD-10-CM

## 2023-09-05 DIAGNOSIS — E1169 Type 2 diabetes mellitus with other specified complication: Secondary | ICD-10-CM | POA: Diagnosis not present

## 2023-09-05 DIAGNOSIS — E669 Obesity, unspecified: Secondary | ICD-10-CM | POA: Diagnosis not present

## 2023-09-05 DIAGNOSIS — Z7984 Long term (current) use of oral hypoglycemic drugs: Secondary | ICD-10-CM

## 2023-09-05 LAB — POCT GLYCOSYLATED HEMOGLOBIN (HGB A1C): HbA1c, POC (controlled diabetic range): 6.6 % (ref 0.0–7.0)

## 2023-09-05 LAB — GLUCOSE, POCT (MANUAL RESULT ENTRY): POC Glucose: 123 mg/dL — AB (ref 70–99)

## 2023-09-05 MED ORDER — GABAPENTIN 300 MG PO CAPS
300.0000 mg | ORAL_CAPSULE | Freq: Two times a day (BID) | ORAL | 2 refills | Status: DC
Start: 2023-09-05 — End: 2023-11-14

## 2023-09-05 NOTE — Patient Instructions (Signed)
Call 1-800-QuitNOW to request nicotine patches for free.

## 2023-09-05 NOTE — Progress Notes (Signed)
Patient ID: Karen Tanner, female    DOB: July 23, 1971  MRN: [id number]  CC: Diabetes (Dm f/u. Ottis Stain on R & L feet - suspects plantar fasciitus and meds to easing pain. Reports pain management appt 09/07/23/Discuss restarting gabapentin/Yes to flu vax. )   Subjective: Karen Tanner is a 52 y.o. female who presents for chronic ds management. Her concerns today include:  Patient with history of HTN, diabetes, fibroid, HL, anemia, tob dep, chronic LBP had fusion surgery 2004, OA RT knee, history of papilloma LT breast 02/2022 (does not need chemoprophylaxis per onc Dr. Al Pimple)   HYPERTENSION  Should be on Norvasc 10 mg and Diovan 40 mg daily Has a pill box but forgot to put Diovan in it this wk.  Taking Norvasc  Tob dep: Smoking 1 pack a day.  Prescribed nicotine patches on last visit with me in October.  Did not get it because it was not covered by her insurance.  She would still like to try to quit smoking or at least cut back.  DM/Obesity: Results for orders placed or performed in visit on 09/05/23  POCT glycosylated hemoglobin (Hb A1C)  Result Value Ref Range   Hemoglobin A1C     HbA1c POC (<> result, manual entry)     HbA1c, POC (prediabetic range)     HbA1c, POC (controlled diabetic range) 6.6 0.0 - 7.0 %  POCT glucose (manual entry)  Result Value Ref Range   POC Glucose 123 (A) 70 - 99 mg/dl  taking Amaryl 2mg  consistently Not checking   Wgh was 259 on last visit in Oct 2023.  Down 14 lbs Doing ok with habits but still drinks juice a few times a wk C/o burning in feet x few mths. Out of Gabapentin Taking Lipitor as prescribed.  Reports being dx with plantar faciitis by Ortho; doing P.T 2x/wk for feet, RT knee and back. Followed at [facility] for knee and back.  Has appt with Pain Management later this wk - [facility]  HM:  Did get cologuard but does not want to do it; prefers c-scope. Never had MMG is ordered on last visit.  Agrees to flu vaccine Patient Active Problem  List   Diagnosis Date Noted   Intraductal papilloma 12/20/2021   Drinking binge 04/17/2021   Tobacco dependence 04/17/2021   Hyperlipidemia associated with type 2 diabetes mellitus (HCC) 04/17/2021   Uterine leiomyoma 08/21/2018   Type 2 diabetes mellitus with obesity (HCC) 05/21/2016   History of back surgery 10/26/2014   History of anemia 10/26/2014   Essential hypertension 10/26/2014   Chronic low back pain 10/26/2014   Menorrhagia with regular cycle 10/26/2014     Current Outpatient Medications on File Prior to Visit  Medication Sig Dispense Refill   amLODipine (NORVASC) 10 MG tablet Take 1 tablet (10 mg total) by mouth daily. 30 tablet 6   atorvastatin (LIPITOR) 20 MG tablet Take 1 tablet (20 mg total) by mouth daily. 90 tablet 3   glimepiride (AMARYL) 2 MG tablet Take 1 tablet (2 mg total) by mouth daily before breakfast. 30 tablet 4   meloxicam (MOBIC) 15 MG tablet Take 1 tablet (15 mg total) by mouth daily. 30 tablet 4   methocarbamol (ROBAXIN) 500 MG tablet TAKE 1 TABLET (500 MG TOTAL) BY MOUTH 2 (TWO) TIMES DAILY AS NEEDED FOR MUSCLE SPASMS. 60 tablet 0   traMADol (ULTRAM) 50 MG tablet Take 50 mg by mouth 2 (two) times daily as needed.  valsartan (DIOVAN) 160 MG tablet TAKE 1/2 TABLET (80 MG TOTAL) BY MOUTH DAILY. 45 tablet 0   Accu-Chek Softclix Lancets lancets Use to check blood sugar once daily. Dx: E11.69 (Patient not taking: Reported on 06/05/2023) 100 each 2   Blood Glucose Monitoring Suppl (ACCU-CHEK GUIDE) w/Device KIT Use to check blood sugar once daily. Dx: E11.69 (Patient not taking: Reported on 06/05/2023) 1 kit 0   Blood Pressure Monitor KIT Use to check blood pressure daily (Patient not taking: Reported on 06/05/2023) 1 kit 0   gabapentin (NEURONTIN) 300 MG capsule Take 300 mg by mouth 2 (two) times daily.     gabapentin (NEURONTIN) 300 MG capsule gabapentin 300 mg capsule  Take 1 capsule twice a day by oral route as needed for 30 days. (Patient not taking: Reported  on 06/05/2023)     glucose blood (ACCU-CHEK GUIDE) test strip Use to check blood sugar once daily. Dx: E11.69 (Patient not taking: Reported on 06/05/2023) 100 each 12   Misc. Devices MISC Blood pressure device (Patient not taking: Reported on 06/05/2023) 1 Device 0   nicotine (NICODERM CQ - DOSED IN MG/24 HOURS) 21 mg/24hr patch Place 1 patch (21 mg total) onto the skin daily. (Patient not taking: Reported on 06/05/2023) 28 patch 1   No current facility-administered medications on file prior to visit.    No Known Allergies  Social History   Socioeconomic History   Marital status: Single    Spouse name: Not on file   Number of children: Not on file   Years of education: Not on file   Highest education level: Not on file  Occupational History   Not on file  Tobacco Use   Smoking status: Every Day    Current packs/day: 0.25    Average packs/day: 0.3 packs/day for 14.0 years (3.5 ttl pk-yrs)    Types: Cigarettes   Smokeless tobacco: Never   Tobacco comments:    Smoking 6-7 cigs per day  Vaping Use   Vaping status: Never Used  Substance and Sexual Activity   Alcohol use: Yes    Comment: 5-6 drinks per day on weekends   Drug use: Yes    Types: Marijuana   Sexual activity: Not on file  Other Topics Concern   Not on file  Social History Narrative   Not on file   Social Determinants of Health   Financial Resource Strain: Low Risk  (07/09/2023)   Overall Financial Resource Strain (CARDIA)    Difficulty of Paying Living Expenses: Not hard at all  Food Insecurity: No Food Insecurity (07/09/2023)   Hunger Vital Sign    Worried About Running Out of Food in the Last Year: Never true    Ran Out of Food in the Last Year: Never true  Transportation Needs: No Transportation Needs (07/09/2023)   PRAPARE - Administrator, Civil Service (Medical): No    Lack of Transportation (Non-Medical): No  Physical Activity: Insufficiently Active (07/09/2023)   Exercise Vital Sign    Days of  Exercise per Week: 3 days    Minutes of Exercise per Session: 30 min  Stress: No Stress Concern Present (07/09/2023)   [employer] of Occupational Health - Occupational Stress Questionnaire    Feeling of Stress : Not at all  Social Connections: Socially Isolated (07/09/2023)   Social Connection and Isolation Panel [NHANES]    Frequency of Communication with Friends and Family: More than three times a week    Frequency of Social Gatherings  with Friends and Family: Three times a week    Attends Religious Services: Never    Active Member of Clubs or Organizations: No    Attends Banker Meetings: Never    Marital Status: Never married  Intimate Partner Violence: Not At Risk (07/09/2023)   Humiliation, Afraid, Rape, and Kick questionnaire    Fear of Current or Ex-Partner: No    Emotionally Abused: No    Physically Abused: No    Sexually Abused: No    Family History  Problem Relation Age of Onset   Diabetes Mother    Hyperlipidemia Mother    Heart disease Mother    Diabetes Father    Diabetes Maternal Grandmother     Past Surgical History:  Procedure Laterality Date   BACK SURGERY     Bone fusion   BREAST LUMPECTOMY WITH RADIOACTIVE SEED LOCALIZATION Left 03/09/2022   Procedure: LEFT BREAST LUMPECTOMY WITH RADIOACTIVE SEED LOCALIZATION;  Surgeon: Chevis Pretty III, MD;  Location: [facility];  Service: General;  Laterality: Left;    ROS: Review of Systems Negative except as stated above  PHYSICAL EXAM: BP (!) 167/78 (BP Location: Left Arm, Patient Position: Sitting, Cuff Size: Large)   Pulse 62   Temp 97.8 F (36.6 C) (Oral)   Ht 5\' 7"  (1.702 m)   Wt 245 lb (111.1 kg)   SpO2 100%   BMI 38.37 kg/m   Wt Readings from Last 3 Encounters:  09/05/23 245 lb (111.1 kg)  07/09/23 245 lb (111.1 kg)  06/05/23 245 lb 3.2 oz (111.2 kg)    Physical Exam  General appearance - alert, well appearing, and in no distress.  During the visit, patient was on the  phone trying to arrange her transportation back home for about 10 minutes Mental status - normal mood, behavior, speech, dress, motor activity, and thought processes Neck - supple, no significant adenopathy Chest - clear to auscultation, no wheezes, rales or rhonchi, symmetric air entry Heart - normal rate, regular rhythm, normal S1, S2, no murmurs, rubs, clicks or gallops Extremities - peripheral pulses normal, no pedal edema, no clubbing or cyanosis      Latest Ref Rng & Units 06/05/2023    3:00 PM 09/18/2022    4:53 PM 09/07/2022    1:37 AM  CMP  Glucose 70 - 99 mg/dL 952  78  841   BUN 6 - 24 mg/dL 20  11  13    Creatinine 0.57 - 1.00 mg/dL 3.24  4.01  0.27   Sodium 134 - 144 mmol/L 140  139  142   Potassium 3.5 - 5.2 mmol/L 4.7  3.4  4.1   Chloride 96 - 106 mmol/L 101  102  101   CO2 20 - 29 mmol/L 26  28  25    Calcium 8.7 - 10.2 mg/dL 9.8  9.6  9.7   Total Protein 6.0 - 8.5 g/dL 7.4  7.4  8.1   Total Bilirubin 0.0 - 1.2 mg/dL 0.4  0.5  0.2   Alkaline Phos 44 - 121 IU/L 93  61  74   AST 0 - 40 IU/L 32  23  25   ALT 0 - 32 IU/L 25  20  21     Lipid Panel     Component Value Date/Time   CHOL 202 (H) 03/28/2022 1411   TRIG 61 03/28/2022 1411   HDL 82 03/28/2022 1411   CHOLHDL 2.5 03/28/2022 1411   CHOLHDL 3.2 01/23/2016 0956  VLDL 9 01/23/2016 0956   LDLCALC 109 (H) 03/28/2022 1411    CBC    Component Value Date/Time   WBC 6.8 09/18/2022 1653   RBC 4.82 09/18/2022 1653   HGB 13.4 09/18/2022 1653   HGB 14.2 03/28/2022 1411   HCT 41.1 09/18/2022 1653   HCT 41.4 03/28/2022 1411   PLT 240 09/18/2022 1653   PLT 245 03/28/2022 1411   MCV 85.3 09/18/2022 1653   MCV 86 03/28/2022 1411   MCH 27.8 09/18/2022 1653   MCHC 32.6 09/18/2022 1653   RDW 13.9 09/18/2022 1653   RDW 13.0 03/28/2022 1411   LYMPHSABS 1.3 09/07/2022 0137   MONOABS 0.8 09/07/2022 0137   EOSABS 0.0 09/07/2022 0137   BASOSABS 0.1 09/07/2022 0137    ASSESSMENT AND PLAN: 1. Type 2 diabetes mellitus  with obesity (HCC) A1c at goal. Continue Amaryl. Commended her on weight loss.  Encouraged her to continue to try to eat healthy - POCT glycosylated hemoglobin (Hb A1C) - POCT glucose (manual entry) - Microalbumin / creatinine urine ratio - CBC - Lipid panel  2. Diabetes mellitus treated with oral medication (HCC) See #1 above.  3. Diabetic peripheral neuropathy (HCC) Restart gabapentin.  Advised to take 300 mg once a day for 1 week then after that increased to twice a day. - gabapentin (NEURONTIN) 300 MG capsule; Take 1 capsule (300 mg total) by mouth 2 (two) times daily.  Dispense: 180 capsule; Refill: 2  4. Hyperlipidemia associated with type 2 diabetes mellitus (HCC) Continue atorvastatin.  Check lipid profile today.  5. Hypertension associated with type 2 diabetes mellitus (HCC) Not at goal.  She mistakenly forgot to put Diovan in her med box for this past week.  She will resume taking and put it in her med box.    Continue Norvasc  6. Tobacco dependence Advised to quit.  Given information for 1 800 quit NOW to call to request free nicotine patches and gum.  7. Encounter for screening mammogram for malignant neoplasm of breast - MM Digital Screening; Future  8. Screening for colon cancer - Ambulatory referral to Gastroenterology  9. Encounter for immunization - Flu vaccine trivalent PF, 6mos and older(Flulaval,Afluria,Fluarix,Fluzone)    Patient was given the opportunity to ask questions.  Patient verbalized understanding of the plan and was able to repeat key elements of the plan.   This documentation was completed using Paediatric nurse.  Any transcriptional errors are unintentional.  Orders Placed This Encounter  Procedures   POCT glycosylated hemoglobin (Hb A1C)   POCT glucose (manual entry)     Requested Prescriptions    No prescriptions requested or ordered in this encounter    No follow-ups on file.  Jonah Blue, MD, FACP

## 2023-09-06 LAB — CBC
Hematocrit: 39.7 % (ref 34.0–46.6)
Hemoglobin: 13.2 g/dL (ref 11.1–15.9)
MCH: 27.9 pg (ref 26.6–33.0)
MCHC: 33.2 g/dL (ref 31.5–35.7)
MCV: 84 fL (ref 79–97)
Platelets: 257 10*3/uL (ref 150–450)
RBC: 4.73 x10E6/uL (ref 3.77–5.28)
RDW: 13.5 % (ref 11.7–15.4)
WBC: 6.3 10*3/uL (ref 3.4–10.8)

## 2023-09-06 LAB — MICROALBUMIN / CREATININE URINE RATIO
Creatinine, Urine: 121.5 mg/dL
Microalb/Creat Ratio: 11 mg/g{creat} (ref 0–29)
Microalbumin, Urine: 13.8 ug/mL

## 2023-09-06 LAB — LIPID PANEL
Chol/HDL Ratio: 2.8 ratio (ref 0.0–4.4)
Cholesterol, Total: 154 mg/dL (ref 100–199)
HDL: 56 mg/dL (ref >39)
LDL Chol Calc (NIH): 75 mg/dL (ref 0–99)
Triglycerides: 133 mg/dL (ref 0–149)
VLDL Cholesterol Cal: 23 mg/dL (ref 5–40)

## 2023-09-07 DIAGNOSIS — G894 Chronic pain syndrome: Secondary | ICD-10-CM | POA: Diagnosis not present

## 2023-09-07 DIAGNOSIS — F17210 Nicotine dependence, cigarettes, uncomplicated: Secondary | ICD-10-CM | POA: Diagnosis not present

## 2023-09-07 DIAGNOSIS — E1169 Type 2 diabetes mellitus with other specified complication: Secondary | ICD-10-CM | POA: Diagnosis not present

## 2023-09-07 DIAGNOSIS — M79672 Pain in left foot: Secondary | ICD-10-CM | POA: Diagnosis not present

## 2023-09-07 DIAGNOSIS — Z0000 Encounter for general adult medical examination without abnormal findings: Secondary | ICD-10-CM | POA: Diagnosis not present

## 2023-09-07 DIAGNOSIS — M79671 Pain in right foot: Secondary | ICD-10-CM | POA: Diagnosis not present

## 2023-09-07 DIAGNOSIS — M47816 Spondylosis without myelopathy or radiculopathy, lumbar region: Secondary | ICD-10-CM | POA: Diagnosis not present

## 2023-09-07 DIAGNOSIS — M25561 Pain in right knee: Secondary | ICD-10-CM | POA: Diagnosis not present

## 2023-09-07 DIAGNOSIS — Z79899 Other long term (current) drug therapy: Secondary | ICD-10-CM | POA: Diagnosis not present

## 2023-09-10 DIAGNOSIS — M545 Low back pain, unspecified: Secondary | ICD-10-CM | POA: Diagnosis not present

## 2023-09-10 DIAGNOSIS — R262 Difficulty in walking, not elsewhere classified: Secondary | ICD-10-CM | POA: Diagnosis not present

## 2023-09-10 DIAGNOSIS — M25561 Pain in right knee: Secondary | ICD-10-CM | POA: Diagnosis not present

## 2023-09-10 DIAGNOSIS — M6281 Muscle weakness (generalized): Secondary | ICD-10-CM | POA: Diagnosis not present

## 2023-09-10 DIAGNOSIS — M79672 Pain in left foot: Secondary | ICD-10-CM | POA: Diagnosis not present

## 2023-09-19 DIAGNOSIS — M6281 Muscle weakness (generalized): Secondary | ICD-10-CM | POA: Diagnosis not present

## 2023-09-19 DIAGNOSIS — M25561 Pain in right knee: Secondary | ICD-10-CM | POA: Diagnosis not present

## 2023-09-19 DIAGNOSIS — M545 Low back pain, unspecified: Secondary | ICD-10-CM | POA: Diagnosis not present

## 2023-09-19 DIAGNOSIS — R262 Difficulty in walking, not elsewhere classified: Secondary | ICD-10-CM | POA: Diagnosis not present

## 2023-09-19 DIAGNOSIS — M79672 Pain in left foot: Secondary | ICD-10-CM | POA: Diagnosis not present

## 2023-09-26 DIAGNOSIS — M79672 Pain in left foot: Secondary | ICD-10-CM | POA: Diagnosis not present

## 2023-09-26 DIAGNOSIS — R262 Difficulty in walking, not elsewhere classified: Secondary | ICD-10-CM | POA: Diagnosis not present

## 2023-09-26 DIAGNOSIS — M6281 Muscle weakness (generalized): Secondary | ICD-10-CM | POA: Diagnosis not present

## 2023-09-26 DIAGNOSIS — M545 Low back pain, unspecified: Secondary | ICD-10-CM | POA: Diagnosis not present

## 2023-09-26 DIAGNOSIS — M25561 Pain in right knee: Secondary | ICD-10-CM | POA: Diagnosis not present

## 2023-10-05 DIAGNOSIS — E1169 Type 2 diabetes mellitus with other specified complication: Secondary | ICD-10-CM | POA: Diagnosis not present

## 2023-10-05 DIAGNOSIS — G894 Chronic pain syndrome: Secondary | ICD-10-CM | POA: Diagnosis not present

## 2023-10-05 DIAGNOSIS — Z79899 Other long term (current) drug therapy: Secondary | ICD-10-CM | POA: Diagnosis not present

## 2023-10-05 DIAGNOSIS — M1711 Unilateral primary osteoarthritis, right knee: Secondary | ICD-10-CM | POA: Diagnosis not present

## 2023-10-05 DIAGNOSIS — Z9181 History of falling: Secondary | ICD-10-CM | POA: Diagnosis not present

## 2023-10-05 DIAGNOSIS — M7730 Calcaneal spur, unspecified foot: Secondary | ICD-10-CM | POA: Diagnosis not present

## 2023-10-05 DIAGNOSIS — I10 Essential (primary) hypertension: Secondary | ICD-10-CM | POA: Diagnosis not present

## 2023-10-05 DIAGNOSIS — M47816 Spondylosis without myelopathy or radiculopathy, lumbar region: Secondary | ICD-10-CM | POA: Diagnosis not present

## 2023-10-09 DIAGNOSIS — Z79899 Other long term (current) drug therapy: Secondary | ICD-10-CM | POA: Diagnosis not present

## 2023-10-15 ENCOUNTER — Telehealth: Payer: Self-pay | Admitting: Internal Medicine

## 2023-10-15 ENCOUNTER — Telehealth (INDEPENDENT_AMBULATORY_CARE_PROVIDER_SITE_OTHER): Payer: Self-pay | Admitting: Internal Medicine

## 2023-10-15 NOTE — Telephone Encounter (Signed)
Copied from CRM [id number]. Topic: General - Other >> Oct 15, 2023  2:47 PM Everette C wrote: Reason for CRM: Bronson Ing with [facility] has called to follow up on a request for an order for a bone density screening that was faxed to the practice yesterday 10/14/23   The urgency of the order has been stressed due to the patient being scheduled for 10/16/23   Please contact further when possible phone [phone] ext [phone]

## 2023-10-15 NOTE — Telephone Encounter (Signed)
Copied from CRM [id number]. Topic: General - Other >> Oct 15, 2023 10:19 AM Everette C wrote: Reason for CRM: [facility] has called to follow up on a request for an order for a bone density screening that was faxed to the practice yesterday 10/14/23  The urgency of the order has been stressed due to the patient being scheduled for 10/16/23  Please contact further when possible phone [phone] ext [phone] fax [phone]

## 2023-10-16 ENCOUNTER — Inpatient Hospital Stay: Admission: RE | Admit: 2023-10-16 | Payer: 59 | Source: Ambulatory Visit

## 2023-10-16 ENCOUNTER — Other Ambulatory Visit: Payer: Self-pay | Admitting: Internal Medicine

## 2023-10-16 DIAGNOSIS — E1169 Type 2 diabetes mellitus with other specified complication: Secondary | ICD-10-CM

## 2023-10-16 MED ORDER — ACCU-CHEK GUIDE W/DEVICE KIT
PACK | 0 refills | Status: DC
Start: 2023-10-16 — End: 2024-05-05

## 2023-10-16 MED ORDER — ACCU-CHEK GUIDE VI STRP
ORAL_STRIP | 12 refills | Status: AC
Start: 2023-10-16 — End: ?

## 2023-10-16 MED ORDER — ACCU-CHEK SOFTCLIX LANCETS MISC: 2 refills | Status: AC

## 2023-10-16 NOTE — Telephone Encounter (Signed)
Please refer to my documentation in the chart from this a.m.

## 2023-10-16 NOTE — Addendum Note (Signed)
Addended by: Jonah Blue B on: 10/16/2023 08:47 AM   Modules accepted: Orders

## 2023-10-16 NOTE — Telephone Encounter (Signed)
Dr.Johnson called [facility] and spoke with Sue Lush and had her cancel the bone density study. Patient agreed and was made aware. No further assistance required at this time. Please see previous telephone encounter for further details.

## 2023-10-16 NOTE — Telephone Encounter (Addendum)
PC place to pt this a.m.  I inquired who had ordered bone density.  I had ordered MMG on our last visit not bone density.  Pt states she was not certain and had forgotten about BMD appt today and did not plan to keep the appt.. Advised that she may qualify for bone density study based on being postmenopausal with cigarette smoking.  However we decided to hold off on bone density study for now and we will have them reach out to her to schedule her mammogram.  I called [facility] and spoke with Sue Lush and had her cancel the bone density study.  Patient also reported to me that she needs new diabetic testing supplies including glucometer as her current one no longer works.  She had episode of low blood sugar 52 when she was at her pain management appointment about a month ago.  States that they had to give her candy to get her blood sugar back up.  She has not been able to check blood sugars recently because her device quit working.  I told her that I will send prescription to her pharmacy for a glucometer with testing supplies.  If she finds that blood sugars are running low, she should let me know because we can have her decrease the dose of the Amaryl.  Patient expressed understanding.

## 2023-10-24 ENCOUNTER — Encounter: Payer: Self-pay | Admitting: Internal Medicine

## 2023-10-28 ENCOUNTER — Other Ambulatory Visit: Payer: Self-pay | Admitting: Internal Medicine

## 2023-10-28 ENCOUNTER — Ambulatory Visit: Payer: Self-pay | Admitting: *Deleted

## 2023-10-28 DIAGNOSIS — M545 Low back pain, unspecified: Secondary | ICD-10-CM

## 2023-10-28 DIAGNOSIS — I10 Essential (primary) hypertension: Secondary | ICD-10-CM

## 2023-10-28 NOTE — Telephone Encounter (Signed)
Summary: toenail problem diabetic   Pt called saying her toe was burning yesterday and she noticed that the toenail came off and she is diabetic .  Does she need to be seen or what should she look for.  [phone]     Reason for Disposition  Toenail is completely torn off (toenail avulsion)  Answer Assessment - Initial Assessment Questions 1. MECHANISM: "How did the injury happen?"      Toenail came off 2. ONSET: "When did the injury happen?" (Minutes or hours ago)      Saturday- had some burning pain- very sore 3. LOCATION: "What part of the toe is injured?" "Is the nail damaged?"      Small toe- left foot- patient notice half of nail was gone- she removed the other half 4. APPEARANCE of TOE INJURY: "What does the injury look like?"      Half of toe nail was gone- patient removed the other half 5. SEVERITY: "Can you use the foot normally?" "Can you walk?"      No- unable to were shoe on foot- very sore  7. PAIN: "Is there pain?" If Yes, ask: "How bad is the pain?"   (e.g., Scale 1-10; or mild, moderate, severe)     7/10 8. TETANUS: For any breaks in the skin, ask: "When was the last tetanus booster?"     2017 9. DIABETES: "Do you have a history of diabetes or poor circulation in the feet?"     Yes- type 2  Protocols used: Toe Injury-A-AH

## 2023-10-28 NOTE — Telephone Encounter (Signed)
Call to patient she has been notified of appointment in the morning- only to address this problem.

## 2023-10-28 NOTE — Telephone Encounter (Signed)
  Chief Complaint: pain- nail off small toe- left foot Symptoms: nail off toe- painful unable to wear shoe Frequency: Saturday  Disposition: [] ED /[] Urgent Care (no appt availability in office) / [] Appointment(In office/virtual)/ []  [facility]/ [] Home Care/ [x] Refused Recommended Disposition /[] [facility]/ []  Follow-up with PCP Additional Notes: No open appointment- advised mobile unit- but patient does not have transportation- she request office appointment. Patient is missing work- she needs to return to work- but needs to be seen first.

## 2023-10-29 ENCOUNTER — Other Ambulatory Visit: Payer: Self-pay | Admitting: Internal Medicine

## 2023-10-29 ENCOUNTER — Ambulatory Visit: Payer: 59 | Attending: Family Medicine | Admitting: Internal Medicine

## 2023-10-29 ENCOUNTER — Encounter: Payer: Self-pay | Admitting: Internal Medicine

## 2023-10-29 VITALS — BP 135/82 | HR 66 | Temp 98.1°F | Ht 67.0 in | Wt 240.0 lb

## 2023-10-29 DIAGNOSIS — M79675 Pain in left toe(s): Secondary | ICD-10-CM

## 2023-10-29 DIAGNOSIS — E11649 Type 2 diabetes mellitus with hypoglycemia without coma: Secondary | ICD-10-CM | POA: Diagnosis not present

## 2023-10-29 DIAGNOSIS — E1142 Type 2 diabetes mellitus with diabetic polyneuropathy: Secondary | ICD-10-CM | POA: Diagnosis not present

## 2023-10-29 DIAGNOSIS — F17210 Nicotine dependence, cigarettes, uncomplicated: Secondary | ICD-10-CM | POA: Diagnosis not present

## 2023-10-29 DIAGNOSIS — Z7984 Long term (current) use of oral hypoglycemic drugs: Secondary | ICD-10-CM | POA: Diagnosis not present

## 2023-10-29 MED ORDER — FREESTYLE LIBRE 3 PLUS SENSOR MISC: 11 refills | Status: AC

## 2023-10-29 MED ORDER — GLIMEPIRIDE 2 MG PO TABS
2.0000 mg | ORAL_TABLET | Freq: Every day | ORAL | 1 refills | Status: DC
Start: 2023-10-29 — End: 2023-11-26

## 2023-10-29 MED ORDER — FREESTYLE LIBRE 3 READER DEVI: 0 refills | Status: AC

## 2023-10-29 NOTE — Telephone Encounter (Signed)
Requested medication (s) are due for refill today: Yes  Requested medication (s) are on the active medication list: Yes  Last refill:  08/16/23  Future visit scheduled: Yes  Notes to clinic:  Not delegated.    Requested Prescriptions  Pending Prescriptions Disp Refills   methocarbamol (ROBAXIN) 500 MG tablet [Pharmacy Med Name: METHOCARBAMOL 500 MG TABLET] 60 tablet 0    Sig: TAKE 1 TABLET (500 MG TOTAL) BY MOUTH 2 (TWO) TIMES DAILY AS NEEDED FOR MUSCLE SPASMS.     Not Delegated - Analgesics:  Muscle Relaxants Failed - 10/28/2023 11:22 AM      Failed - This refill cannot be delegated      Passed - Valid encounter within last 6 months    Recent Outpatient Visits           Today Toe pain, left   [place] [facility] Jonah Blue B, MD   1 month ago Type 2 diabetes mellitus with obesity [facility])   [place] [facility] Jonah Blue B, MD   4 months ago Type 2 diabetes mellitus with obesity [facility])   [facility] [place], [place]   1 year ago Type 2 diabetes mellitus with morbid obesity [facility])   [facility] Jonah Blue B, MD   1 year ago Type 2 diabetes mellitus with morbid obesity [facility])   [place] [facility] Marcine Matar, MD       Future Appointments             In 2 months Marcine Matar, MD [facility]             valsartan (DIOVAN) 160 MG tablet [Pharmacy Med Name: VALSARTAN 160 MG TABLET] 15 tablet 2    Sig: TAKE 1/2 TABLET (80 MG TOTAL) BY MOUTH DAILY.     Cardiovascular:  Angiotensin Receptor Blockers Failed - 10/28/2023 11:22 AM      Failed - Last BP in normal range    BP Readings from Last 1 Encounters:  10/29/23 (!) 147/86         Passed - Cr in normal range and within 180 days    Creat  Date Value Ref Range Status  06/13/2016 0.59 0.50 - 1.10 mg/dL  Final   Creatinine, Ser  Date Value Ref Range Status  06/05/2023 0.92 0.57 - 1.00 mg/dL Final   Creatinine, POC  Date Value Ref Range Status  06/19/2023 187.2 mg/dL Final    Comment:    Abstracted by HIM         Passed - K in normal range and within 180 days    Potassium  Date Value Ref Range Status  06/05/2023 4.7 3.5 - 5.2 mmol/L Final         Passed - Patient is not pregnant      Passed - Valid encounter within last 6 months    Recent Outpatient Visits           Today Toe pain, left   [place] [facility] Jonah Blue B, MD   1 month ago Type 2 diabetes mellitus with obesity [facility])   [place] [facility] Jonah Blue B, MD   4 months ago Type 2 diabetes mellitus with obesity [facility])   [place] [facility], [place], [place]  1 year ago Type 2 diabetes mellitus with morbid obesity [facility])   [place] [facility] Jonah Blue B, MD   1 year ago Type 2 diabetes mellitus with morbid obesity [facility])   [place] [facility] Marcine Matar, MD       Future Appointments             In 2 months Laural Benes, Binnie Rail, MD [facility]

## 2023-10-29 NOTE — Progress Notes (Signed)
Patient ID: Karen Tanner, female    DOB: April 27, 1971  MRN: [id number]  CC: Toe Pain (Toenail on L foot fell off X3 days, redness, painful / irritation /Requesting a work note )   Subjective: Karen Tanner is a 52 y.o. female who presents for UC visit. Her concerns today include:  Patient with history of HTN, diabetes, fibroid, HL, anemia, tob dep, chronic LBP had fusion surgery 2004, OA RT knee, history of papilloma LT breast 02/2022 (does not need chemoprophylaxis per onc Karen Tanner)   C/o pain in LT 5th toe Started 3 days ago with burning in the toe while at work.  When she took shoe off, she noticed that toenail had come off but there was still a small piece in there -using Neosporin abx cream so it will not get infected Still bothering her when she puts shoe on Out of work yesterday and today because of the discomfort  Feet feel like they are on fire when she walks. I restarted her on Gabapentin 300 mg BID on last visit 09/05/2023 for this.  She has not been taking because she is on Hydrocodone/Tylenol for knee/back pain through [facility] pain clinic.  She was not sure whether she should be taking it with the hydrocodone.  However the hydrocodone does not help with the burning sensation in her feet..    Reports BS had dropped while at pain management last mth. BS was in the 50s.  Had to be given some candy to get it up.  Did not feel that her BS was that low.  Currently on Amaryl 2 mg daily Not checking BS because current device no longer works; not sure if it is low battery.  We sent rxn for new testing supplies about 2 wks ago to her pharmacy.  Plans to pick up the end of this wk.   Patient Active Problem List   Diagnosis Date Noted   Intraductal papilloma 12/20/2021   Drinking binge 04/17/2021   Tobacco dependence 04/17/2021   Hyperlipidemia associated with type 2 diabetes mellitus (HCC) 04/17/2021   Uterine leiomyoma 08/21/2018   Type 2 diabetes mellitus with obesity (HCC)  05/21/2016   History of back surgery 10/26/2014   History of anemia 10/26/2014   Essential hypertension 10/26/2014   Chronic low back pain 10/26/2014   Menorrhagia with regular cycle 10/26/2014     Current Outpatient Medications on File Prior to Visit  Medication Sig Dispense Refill   Accu-Chek Softclix Lancets lancets Use to check blood sugar once daily. Dx: E11.69 100 each 2   amLODipine (NORVASC) 10 MG tablet Take 1 tablet (10 mg total) by mouth daily. 30 tablet 6   atorvastatin (LIPITOR) 20 MG tablet Take 1 tablet (20 mg total) by mouth daily. 90 tablet 3   Blood Glucose Monitoring Suppl (ACCU-CHEK GUIDE) w/Device KIT Use to check blood sugar once daily. Dx: E11.69 1 kit 0   Blood Pressure Monitor KIT Use to check blood pressure daily 1 kit 0   gabapentin (NEURONTIN) 300 MG capsule Take 1 capsule (300 mg total) by mouth 2 (two) times daily. 180 capsule 2   glucose blood (ACCU-CHEK GUIDE) test strip Use to check blood sugar once daily. Dx: E11.69 100 each 12   meloxicam (MOBIC) 15 MG tablet Take 1 tablet (15 mg total) by mouth daily. 30 tablet 4   methocarbamol (ROBAXIN) 500 MG tablet TAKE 1 TABLET (500 MG TOTAL) BY MOUTH 2 (TWO) TIMES DAILY AS NEEDED FOR MUSCLE SPASMS. 60  tablet 0   Misc. Devices MISC Blood pressure device 1 Device 0   valsartan (DIOVAN) 160 MG tablet TAKE 1/2 TABLET (80 MG TOTAL) BY MOUTH DAILY. 45 tablet 0   nicotine (NICODERM CQ - DOSED IN MG/24 HOURS) 21 mg/24hr patch Place 1 patch (21 mg total) onto the skin daily. (Patient not taking: Reported on 10/29/2023) 28 patch 1   No current facility-administered medications on file prior to visit.    No Known Allergies  Social History   Socioeconomic History   Marital status: Single    Spouse name: Not on file   Number of children: Not on file   Years of education: Not on file   Highest education level: Not on file  Occupational History   Not on file  Tobacco Use   Smoking status: Every Day    Current  packs/day: 0.25    Average packs/day: 0.3 packs/day for 14.0 years (3.5 ttl pk-yrs)    Types: Cigarettes   Smokeless tobacco: Never   Tobacco comments:    Smoking 6-7 cigs per day  Vaping Use   Vaping status: Never Used  Substance and Sexual Activity   Alcohol use: Yes    Comment: 5-6 drinks per day on weekends   Drug use: Yes    Types: Marijuana   Sexual activity: Not on file  Other Topics Concern   Not on file  Social History Narrative   Not on file   Social Determinants of Health   Financial Resource Strain: Low Risk  (10/29/2023)   Overall Financial Resource Strain (CARDIA)    Difficulty of Paying Living Expenses: Not very hard  Food Insecurity: Food Insecurity Present (10/29/2023)   Hunger Vital Sign    Worried About Running Out of Food in the Last Year: Sometimes true    Ran Out of Food in the Last Year: Often true  Transportation Needs: No Transportation Needs (10/29/2023)   PRAPARE - Administrator, Civil Service (Medical): No    Lack of Transportation (Non-Medical): No  Physical Activity: Inactive (10/29/2023)   Exercise Vital Sign    Days of Exercise per Week: 0 days    Minutes of Exercise per Session: 0 min  Stress: No Stress Concern Present (10/29/2023)   [employer] of Occupational Health - Occupational Stress Questionnaire    Feeling of Stress : Not at all  Social Connections: Socially Isolated (10/29/2023)   Social Connection and Isolation Panel [NHANES]    Frequency of Communication with Friends and Family: Three times a week    Frequency of Social Gatherings with Friends and Family: Once a week    Attends Religious Services: Never    Database administrator or Organizations: No    Attends Banker Meetings: Never    Marital Status: Never married  Intimate Partner Violence: Not At Risk (10/29/2023)   Humiliation, Afraid, Rape, and Kick questionnaire    Fear of Current or Ex-Partner: No    Emotionally Abused: No     Physically Abused: No    Sexually Abused: No    Family History  Problem Relation Age of Onset   Diabetes Mother    Hyperlipidemia Mother    Heart disease Mother    Diabetes Father    Diabetes Maternal Grandmother     Past Surgical History:  Procedure Laterality Date   BACK SURGERY     Bone fusion   BREAST LUMPECTOMY WITH RADIOACTIVE SEED LOCALIZATION Left 03/09/2022   Procedure: LEFT BREAST LUMPECTOMY  WITH RADIOACTIVE SEED LOCALIZATION;  Surgeon: Griselda Miner, MD;  Location: [facility];  Service: General;  Laterality: Left;    ROS: Review of Systems Negative except as stated above  PHYSICAL EXAM: BP 135/82   Pulse 66   Temp 98.1 F (36.7 C) (Oral)   Ht 5\' 7"  (1.702 m)   Wt 240 lb (108.9 kg)   SpO2 98%   BMI 37.59 kg/m   Physical Exam  General appearance - alert, well appearing, and in no distress Mental status - normal mood, behavior, speech, dress, motor activity, and thought processes Skin - Lt foot: She has some thickness and discoloration of the nails of the big toe and second toe.  Nail on the second toe is hyperpigmented. 5th toe -about 30% of the nail is gone.  No edema or erythema noted.  Nailbed is tender to touch.      Latest Ref Rng & Units 06/05/2023    3:00 PM 09/18/2022    4:53 PM 09/07/2022    1:37 AM  CMP  Glucose 70 - 99 mg/dL 454  78  098   BUN 6 - 24 mg/dL 20  11  13    Creatinine 0.57 - 1.00 mg/dL 1.19  1.47  8.29   Sodium 134 - 144 mmol/L 140  139  142   Potassium 3.5 - 5.2 mmol/L 4.7  3.4  4.1   Chloride 96 - 106 mmol/L 101  102  101   CO2 20 - 29 mmol/L 26  28  25    Calcium 8.7 - 10.2 mg/dL 9.8  9.6  9.7   Total Protein 6.0 - 8.5 g/dL 7.4  7.4  8.1   Total Bilirubin 0.0 - 1.2 mg/dL 0.4  0.5  0.2   Alkaline Phos 44 - 121 IU/L 93  61  74   AST 0 - 40 IU/L 32  23  25   ALT 0 - 32 IU/L 25  20  21     Lipid Panel     Component Value Date/Time   CHOL 154 09/05/2023 1523   TRIG 133 09/05/2023 1523   HDL 56 09/05/2023 1523    CHOLHDL 2.8 09/05/2023 1523   CHOLHDL 3.2 01/23/2016 0956   VLDL 9 01/23/2016 0956   LDLCALC 75 09/05/2023 1523    CBC    Component Value Date/Time   WBC 6.3 09/05/2023 1523   WBC 6.8 09/18/2022 1653   RBC 4.73 09/05/2023 1523   RBC 4.82 09/18/2022 1653   HGB 13.2 09/05/2023 1523   HCT 39.7 09/05/2023 1523   PLT 257 09/05/2023 1523   MCV 84 09/05/2023 1523   MCH 27.9 09/05/2023 1523   MCH 27.8 09/18/2022 1653   MCHC 33.2 09/05/2023 1523   MCHC 32.6 09/18/2022 1653   RDW 13.5 09/05/2023 1523   LYMPHSABS 1.3 09/07/2022 0137   MONOABS 0.8 09/07/2022 0137   EOSABS 0.0 09/07/2022 0137   BASOSABS 0.1 09/07/2022 0137    ASSESSMENT AND PLAN: 1. Toe pain, left No signs of infection at this time on the fifth toe.  The nail is chipped and part of the nail is missing.  Will refer her to podiatry.  Most likely will have to have the nail removed.  I have given her some 2 x 2 gauze to tape around the toe before putting on her socks.  Hopefully this will help to decrease friction and pain when she puts her shoes on.  Work excuse given for yesterday and today. -  Ambulatory referral to Podiatry  2. Diabetic peripheral neuropathy (HCC) Advised patient to move forward with restarting the gabapentin.  Advised that it can cause some drowsiness. - glimepiride (AMARYL) 2 MG tablet; Take 1 tablet (2 mg total) by mouth daily before breakfast.  Dispense: 90 tablet; Refill: 1  3. Hypoglycemia due to type 2 diabetes mellitus [facility]) Patient described an episode of hypoglycemic unawareness which is concerning.  Discussed getting her a CGM if it is covered by her insurance.  She is agreeable to it.  I have sent prescription to her pharmacy for the freestyle libre 3.  If it is dispensed, advised that she has the pharmacist show her how to set it up or she can come to see our clinical pharmacist.  She expressed understanding.  If it is not covered by her insurance, then I recommend that she checks blood sugars at  least twice a day before meals with goal being 90-130.  Went over signs and symptoms of hypoglycemia and how to treat. - Continuous Glucose Sensor (FREESTYLE LIBRE 3 PLUS SENSOR) MISC; Change sensor every 15 days.  Dispense: 2 each; Refill: 11 - Continuous Glucose Receiver (FREESTYLE LIBRE 3 READER) DEVI; UAD to check blood sugars  Dispense: 1 each; Refill: 0    Patient was given the opportunity to ask questions.  Patient verbalized understanding of the plan and was able to repeat key elements of the plan.   This documentation was completed using Paediatric nurse.  Any transcriptional errors are unintentional.  Orders Placed This Encounter  Procedures   Ambulatory referral to Podiatry     Requested Prescriptions   Signed Prescriptions Disp Refills   glimepiride (AMARYL) 2 MG tablet 90 tablet 1    Sig: Take 1 tablet (2 mg total) by mouth daily before breakfast.   Continuous Glucose Sensor (FREESTYLE LIBRE 3 PLUS SENSOR) MISC 2 each 11    Sig: Change sensor every 15 days.   Continuous Glucose Receiver (FREESTYLE LIBRE 3 READER) DEVI 1 each 0    Sig: UAD to check blood sugars    No follow-ups on file.  Jonah Blue, MD, FACP

## 2023-10-29 NOTE — Patient Instructions (Signed)
You should start your gabapentin to help decrease the neuropathy symptoms in your feet.  The gabapentin can cause some drowsiness.  I have sent a prescription to your pharmacy for the continuous glucose monitor called the freestyle libre 3.  Please have the pharmacist show you how to set it up if it is approved by your insurance.

## 2023-11-02 DIAGNOSIS — Z79899 Other long term (current) drug therapy: Secondary | ICD-10-CM | POA: Diagnosis not present

## 2023-11-02 DIAGNOSIS — E1169 Type 2 diabetes mellitus with other specified complication: Secondary | ICD-10-CM | POA: Diagnosis not present

## 2023-11-02 DIAGNOSIS — G894 Chronic pain syndrome: Secondary | ICD-10-CM | POA: Diagnosis not present

## 2023-11-02 DIAGNOSIS — M47816 Spondylosis without myelopathy or radiculopathy, lumbar region: Secondary | ICD-10-CM | POA: Diagnosis not present

## 2023-11-02 DIAGNOSIS — I10 Essential (primary) hypertension: Secondary | ICD-10-CM | POA: Diagnosis not present

## 2023-11-02 DIAGNOSIS — M7730 Calcaneal spur, unspecified foot: Secondary | ICD-10-CM | POA: Diagnosis not present

## 2023-11-06 DIAGNOSIS — Z79899 Other long term (current) drug therapy: Secondary | ICD-10-CM | POA: Diagnosis not present

## 2023-11-14 ENCOUNTER — Ambulatory Visit: Payer: 59 | Admitting: Podiatry

## 2023-11-14 ENCOUNTER — Encounter: Payer: Self-pay | Admitting: Podiatry

## 2023-11-14 DIAGNOSIS — E1142 Type 2 diabetes mellitus with diabetic polyneuropathy: Secondary | ICD-10-CM | POA: Diagnosis not present

## 2023-11-14 MED ORDER — GABAPENTIN 300 MG PO CAPS
600.0000 mg | ORAL_CAPSULE | Freq: Three times a day (TID) | ORAL | 2 refills | Status: DC
Start: 2023-12-14 — End: 2023-12-12

## 2023-11-14 MED ORDER — LIDOCAINE 5 % EX OINT: 1.0000 | TOPICAL_OINTMENT | CUTANEOUS | 0 refills | Status: DC | PRN

## 2023-11-14 MED ORDER — GABAPENTIN 300 MG PO CAPS: ORAL_CAPSULE | ORAL | 0 refills | Status: DC

## 2023-11-14 NOTE — Progress Notes (Signed)
  Subjective:  Patient ID: Karen Tanner, female    DOB: 1971-10-08,  MRN: [id number]  Chief Complaint  Patient presents with   Foot Pain    5th Left toe pain;partial toenail came off with a burning sensation and some redness*diabetic*;pt also having diabetic neuropathy sensation of burning sensation bottom of both feet-Dr. Jonah Blue refer //offered pt appt on 11/7 at 8:15 but pt declined saying that was too early of an appt time. Patient states having difficulty walking feels like bottom of feet burning.    Discussed the use of AI scribe software for clinical note transcription with the patient, who gave verbal consent to proceed.  History of Present Illness   The patient, with a history of diabetes and hypertension, presents with burning foot pain for the past 1-2 months. The pain is so severe that she has difficulty walking and has had to use a cane. The pain is described as a burning sensation, particularly when walking, and is located on the bottom of both feet. The patient's blood sugar is reportedly well-controlled, with a recent normal A1C, but she reports a recent increase in blood pressure. She is currently taking gabapentin 300mg  twice daily, which she reports helps with the foot pain. She also reports a recent toe injury, which is healing well. The patient also mentions having been prescribed hydrocodone 10mg  by a pain management clinic, but this only helps with back and knee pain, not the foot pain.          Objective:    Physical Exam   VITALS: Palpable pulses, feet are warm and well perfused. MUSCULOSKELETAL: Right toe with nail avulsion, healing, no signs of infection. NEUROLOGICAL: Decreased peripheral sensation, peripheral neuropathy with tenderness in plantar surfaces.       No images are attached to the encounter.    Results          Assessment:   1. Diabetic peripheral neuropathy associated with type 2 diabetes mellitus (HCC)   2. Diabetic peripheral  neuropathy (HCC)      Plan:  Patient was evaluated and treated and all questions answered.  Assessment and Plan    Peripheral Neuropathy   She reports increased burning pain in her feet over the past 1-2 months while on Gabapentin 300mg  twice daily, with no recent changes in blood sugar control. We will increase Gabapentin to 600mg  at night and 300mg  in the morning and afternoon for 2-3 weeks. After this period, if tolerated, the dose will increase to 600mg  three times daily for another 2-3 weeks, aiming for a maintenance dose of 600mg  three times daily. Should side effects arise, we will maintain the current dose and reassess. Additionally, Lidocaine cream is prescribed for topical application on her feet.  Toe Injury   Her toe is healing well with no signs of infection. Current care will continue.  Work-related stress   She requested a day off due to foot pain. A doctor's note will be provided for a day off work on 11/14/2023.  Follow-up   Dr. Laural Benes will be notified about the changes in Gabapentin dose.          Return if symptoms worsen or fail to improve.

## 2023-11-14 NOTE — Patient Instructions (Signed)
VISIT SUMMARY:  During today's visit, we discussed your ongoing foot pain, which has been affecting your ability to walk. We also reviewed your recent toe injury and addressed your request for a day off work due to foot pain. Your blood sugar levels are well-controlled, but there has been a recent increase in your blood pressure.  YOUR PLAN:  -PERIPHERAL NEUROPATHY: Peripheral neuropathy is a condition where the nerves in your feet are damaged, causing pain and burning sensations. We will increase your Gabapentin dose to 600mg  at night and 300mg  in the morning and afternoon for 2-3 weeks. If tolerated, the dose will then increase to 600mg  three times daily for another 2-3 weeks, aiming for a maintenance dose of 600mg  three times daily. If you experience any side effects, we will maintain the current dose and reassess. Additionally, you are prescribed Lidocaine cream to apply topically on your feet.  -TOE INJURY: Your toe injury is healing well with no signs of infection. Continue with the current care plan.  -WORK-RELATED STRESS: You requested a day off due to foot pain. A doctor's note will be provided for a day off work on 11/14/2023.  INSTRUCTIONS:  Please follow up with Dr. Laural Benes regarding the changes in your Gabapentin dose.

## 2023-11-26 ENCOUNTER — Other Ambulatory Visit: Payer: Self-pay | Admitting: Physician Assistant

## 2023-11-26 DIAGNOSIS — E1142 Type 2 diabetes mellitus with diabetic polyneuropathy: Secondary | ICD-10-CM

## 2023-11-26 NOTE — Telephone Encounter (Signed)
Filled with remainder of previous reorder Requested Prescriptions  Pending Prescriptions Disp Refills   glimepiride (AMARYL) 2 MG tablet [Pharmacy Med Name: GLIMEPIRIDE 2 MG TABLET] 30 tablet 4    Sig: TAKE 1 TABLET BY MOUTH DAILY BEFORE BREAKFAST.     Endocrinology:  Diabetes - Sulfonylureas Passed - 11/26/2023  1:38 AM      Passed - HBA1C is between 0 and 7.9 and within 180 days    HbA1c, POC (controlled diabetic range)  Date Value Ref Range Status  09/05/2023 6.6 0.0 - 7.0 % Final         Passed - Cr in normal range and within 360 days    Creat  Date Value Ref Range Status  06/13/2016 0.59 0.50 - 1.10 mg/dL Final   Creatinine, Ser  Date Value Ref Range Status  06/05/2023 0.92 0.57 - 1.00 mg/dL Final   Creatinine, POC  Date Value Ref Range Status  06/19/2023 187.2 mg/dL Final    Comment:    Abstracted by HIM         Passed - Valid encounter within last 6 months    Recent Outpatient Visits           4 weeks ago Toe pain, left   [place] [facility] A Dept Of [place]. [facility] Jonah Blue B, MD   2 months ago Type 2 diabetes mellitus with obesity [facility])   [place] [facility] - A Dept Of [place]. [facility] Jonah Blue B, MD   5 months ago Type 2 diabetes mellitus with obesity [facility])   [place] [facility] - A Dept Of [place]. [facility] [place], [place], [place]   1 year ago Type 2 diabetes mellitus with morbid obesity [facility])   [place] [facility] - A Dept Of [place]. [facility] Jonah Blue B, MD   1 year ago Type 2 diabetes mellitus with morbid obesity [facility])   [place] [facility] - A Dept Of . [facility] Marcine Matar, MD       Future Appointments             In 1 month Laural Benes Binnie Rail, MD [facility] [place] - A Dept Of [place]igh[place] Bridegroom. [facility]

## 2023-12-02 DIAGNOSIS — G894 Chronic pain syndrome: Secondary | ICD-10-CM | POA: Diagnosis not present

## 2023-12-02 DIAGNOSIS — R030 Elevated blood-pressure reading, without diagnosis of hypertension: Secondary | ICD-10-CM | POA: Diagnosis not present

## 2023-12-02 DIAGNOSIS — Z79899 Other long term (current) drug therapy: Secondary | ICD-10-CM | POA: Diagnosis not present

## 2023-12-02 DIAGNOSIS — M47816 Spondylosis without myelopathy or radiculopathy, lumbar region: Secondary | ICD-10-CM | POA: Diagnosis not present

## 2023-12-04 ENCOUNTER — Other Ambulatory Visit: Payer: Self-pay | Admitting: Internal Medicine

## 2023-12-04 DIAGNOSIS — E1142 Type 2 diabetes mellitus with diabetic polyneuropathy: Secondary | ICD-10-CM

## 2023-12-04 MED ORDER — GLIMEPIRIDE 2 MG PO TABS: 2.0000 mg | ORAL_TABLET | Freq: Every day | ORAL | 4 refills | Status: DC

## 2023-12-06 DIAGNOSIS — Z79899 Other long term (current) drug therapy: Secondary | ICD-10-CM | POA: Diagnosis not present

## 2023-12-11 ENCOUNTER — Other Ambulatory Visit: Payer: Self-pay | Admitting: Physician Assistant

## 2023-12-11 ENCOUNTER — Other Ambulatory Visit: Payer: Self-pay | Admitting: Podiatry

## 2023-12-11 DIAGNOSIS — M1711 Unilateral primary osteoarthritis, right knee: Secondary | ICD-10-CM

## 2023-12-11 DIAGNOSIS — G8929 Other chronic pain: Secondary | ICD-10-CM

## 2023-12-11 DIAGNOSIS — I10 Essential (primary) hypertension: Secondary | ICD-10-CM

## 2023-12-29 DIAGNOSIS — M7730 Calcaneal spur, unspecified foot: Secondary | ICD-10-CM | POA: Diagnosis not present

## 2023-12-29 DIAGNOSIS — M47816 Spondylosis without myelopathy or radiculopathy, lumbar region: Secondary | ICD-10-CM | POA: Diagnosis not present

## 2023-12-29 DIAGNOSIS — E1169 Type 2 diabetes mellitus with other specified complication: Secondary | ICD-10-CM | POA: Diagnosis not present

## 2023-12-29 DIAGNOSIS — M1711 Unilateral primary osteoarthritis, right knee: Secondary | ICD-10-CM | POA: Diagnosis not present

## 2023-12-29 DIAGNOSIS — G629 Polyneuropathy, unspecified: Secondary | ICD-10-CM | POA: Diagnosis not present

## 2023-12-29 DIAGNOSIS — I10 Essential (primary) hypertension: Secondary | ICD-10-CM | POA: Diagnosis not present

## 2023-12-29 DIAGNOSIS — Z79899 Other long term (current) drug therapy: Secondary | ICD-10-CM | POA: Diagnosis not present

## 2023-12-29 DIAGNOSIS — Z9181 History of falling: Secondary | ICD-10-CM | POA: Diagnosis not present

## 2024-01-06 ENCOUNTER — Encounter: Payer: Self-pay | Admitting: Internal Medicine

## 2024-01-06 ENCOUNTER — Ambulatory Visit: Payer: 59 | Attending: Internal Medicine | Admitting: Internal Medicine

## 2024-01-06 DIAGNOSIS — E785 Hyperlipidemia, unspecified: Secondary | ICD-10-CM

## 2024-01-06 DIAGNOSIS — E66812 Obesity, class 2: Secondary | ICD-10-CM

## 2024-01-06 DIAGNOSIS — Z6839 Body mass index (BMI) 39.0-39.9, adult: Secondary | ICD-10-CM

## 2024-01-06 DIAGNOSIS — E1169 Type 2 diabetes mellitus with other specified complication: Secondary | ICD-10-CM | POA: Diagnosis not present

## 2024-01-06 DIAGNOSIS — I152 Hypertension secondary to endocrine disorders: Secondary | ICD-10-CM

## 2024-01-06 DIAGNOSIS — L84 Corns and callosities: Secondary | ICD-10-CM | POA: Diagnosis not present

## 2024-01-06 DIAGNOSIS — Z7984 Long term (current) use of oral hypoglycemic drugs: Secondary | ICD-10-CM

## 2024-01-06 DIAGNOSIS — F17210 Nicotine dependence, cigarettes, uncomplicated: Secondary | ICD-10-CM | POA: Diagnosis not present

## 2024-01-06 DIAGNOSIS — E1142 Type 2 diabetes mellitus with diabetic polyneuropathy: Secondary | ICD-10-CM | POA: Diagnosis not present

## 2024-01-06 DIAGNOSIS — Z1231 Encounter for screening mammogram for malignant neoplasm of breast: Secondary | ICD-10-CM

## 2024-01-06 DIAGNOSIS — E119 Type 2 diabetes mellitus without complications: Secondary | ICD-10-CM

## 2024-01-06 DIAGNOSIS — Z1211 Encounter for screening for malignant neoplasm of colon: Secondary | ICD-10-CM

## 2024-01-06 DIAGNOSIS — F17200 Nicotine dependence, unspecified, uncomplicated: Secondary | ICD-10-CM

## 2024-01-06 DIAGNOSIS — E1159 Type 2 diabetes mellitus with other circulatory complications: Secondary | ICD-10-CM | POA: Diagnosis not present

## 2024-01-06 LAB — POCT GLYCOSYLATED HEMOGLOBIN (HGB A1C): HbA1c, POC (controlled diabetic range): 6.9 % (ref 0.0–7.0)

## 2024-01-06 LAB — GLUCOSE, POCT (MANUAL RESULT ENTRY)
POC Glucose: 180 mg/dL — AB (ref 70–99)
POC Glucose: 52 mg/dL — AB (ref 70–99)

## 2024-01-06 MED ORDER — LIDOCAINE 5 % EX OINT
1.0000 | TOPICAL_OINTMENT | CUTANEOUS | 0 refills | Status: DC | PRN
Start: 2024-01-06 — End: 2024-01-28

## 2024-01-06 NOTE — Progress Notes (Signed)
 Patient ID: Karen Tanner, female    DOB: 08-27-1971  MRN: [id number]  CC: Diabetes (DM f/u. Med refill./Neuropathy pain on R & L feet, sores on both feet/Requesting PT referral for feet, knee & back/Yes to mammogram referral)   Subjective: Karen Tanner is a 53 y.o. female who presents for chronic ds management. Her concerns today include:  Patient with history of HTN, diabetes, fibroid, HL, anemia, tob dep, chronic LBP had fusion surgery 2004, OA RT knee, history of papilloma LT breast 02/2022 (does not need chemoprophylaxis per onc Dr. Loretha)   Discussed the use of AI scribe software for clinical note transcription with the patient, who gave verbal consent to proceed.  History of Present Illness   [name], a patient with a history of diabetes, hypertension, hyperlipidemia, and DM neuropathy, presents for routine follow-up visit.    DM: Results for orders placed or performed in visit on 01/06/24  POCT glucose (manual entry)   Collection Time: 01/06/24  3:43 PM  Result Value Ref Range   POC Glucose 52 (A) 70 - 99 mg/dl  POCT glycosylated hemoglobin (Hb A1C)   Collection Time: 01/06/24  3:53 PM  Result Value Ref Range   Hemoglobin A1C     HbA1c POC (<> result, manual entry)     HbA1c, POC (prediabetic range)     HbA1c, POC (controlled diabetic range) 6.9 0.0 - 7.0 %  POCT glucose (manual entry)   Collection Time: 01/06/24  4:21 PM  Result Value Ref Range   POC Glucose 180 (A) 70 - 99 mg/dl  Presents with blood sugars in the 50s today.  Did not eat lunch. She denies feeling symptomatic during this episode. She has a history of similar episodes, one of which occurred during a visit to a pain specialist. -Prescribed continuous glucose monitor on last visit with me in October.  Did get it but states she has just recently learned how to use it.  She has not started using it as yet but states she will put it on tomorrow.  Her recent HbA1c was 6.9, slightly increased from 6.6  in September. She admits to not checking her blood sugars regularly and has been struggling with her diet. She often skips meals and snacks on unhealthy foods. She has gained nine pounds since her last visit in October.   In addition to her diabetes management, she has been dealing with painful calluses on her feet and burning in her feet which she attributes to her neuropathy. She also reports increased pain in her feet, which she describes as a burning sensation, especially after work.  On last visit with me, we left her on gabapentin  300 mg twice a day.  She has seen the podiatrist and he has increased it to 300 mg 2 tabs TID.  He also prescribed lidocaine  cream to use on the bottom of her feet.  She finds it helpful and requests refill. She has been taking it as 300 mg in the morning and 2 tablets at night.  She states that the medication makes her drowsy.  -She has not worked for the past 2 months due to the burning in her feet that gets worse when she is up working.  She cleans houses for living.  She states that her boss told her not to come back until she is 100% back to her baseline.  Tob: She states she has cut down to 1 pack lasting 2 to 3 days.  Not ready to  quit.   HL: She is also on atorvastatin  for her hyperlipidemia, which she takes consistently.     HTN: Reports compliance with Norvasc  10 mg daily and Diovan  40 mg daily.  Took medicines already for today.  Does not check blood pressure as she does not have a device.  She limits salt in the foods. Patient Active Problem List   Diagnosis Date Noted   Intraductal papilloma 12/20/2021   Drinking binge 04/17/2021   Tobacco dependence 04/17/2021   Hyperlipidemia associated with type 2 diabetes mellitus (HCC) 04/17/2021   Uterine leiomyoma 08/21/2018   Type 2 diabetes mellitus with obesity (HCC) 05/21/2016   History of back surgery 10/26/2014   History of anemia 10/26/2014   Essential hypertension 10/26/2014   Chronic low back pain  10/26/2014   Menorrhagia with regular cycle 10/26/2014     Current Outpatient Medications on File Prior to Visit  Medication Sig Dispense Refill   Accu-Chek Softclix Lancets lancets Use to check blood sugar once daily. Dx: E11.69 100 each 2   amLODipine  (NORVASC ) 10 MG tablet TAKE 1 TABLET BY MOUTH DAILY 100 tablet 0   atorvastatin  (LIPITOR) 20 MG tablet Take 1 tablet (20 mg total) by mouth daily. 90 tablet 3   Blood Glucose Monitoring Suppl (ACCU-CHEK GUIDE) w/Device KIT Use to check blood sugar once daily. Dx: E11.69 1 kit 0   Blood Pressure Monitor KIT Use to check blood pressure daily 1 kit 0   Continuous Glucose Receiver (FREESTYLE LIBRE 3 READER) DEVI UAD to check blood sugars 1 each 0   Continuous Glucose Sensor (FREESTYLE LIBRE 3 PLUS SENSOR) MISC Change sensor every 15 days. 2 each 11   gabapentin  (NEURONTIN ) 300 MG capsule TAKE 2 CAPSULES BY MOUTH 3 TIMES DAILY 360 capsule 5   glimepiride  (AMARYL ) 2 MG tablet Take 1 tablet (2 mg total) by mouth daily with breakfast. 30 tablet 4   glucose blood (ACCU-CHEK GUIDE) test strip Use to check blood sugar once daily. Dx: E11.69 100 each 12   meloxicam  (MOBIC ) 15 MG tablet TAKE 1 TABLET BY MOUTH DAILY 100 tablet 0   methocarbamol  (ROBAXIN ) 500 MG tablet TAKE 1 TABLET (500 MG TOTAL) BY MOUTH 2 (TWO) TIMES DAILY AS NEEDED FOR MUSCLE SPASMS. 60 tablet 0   Misc. Devices MISC Blood pressure device 1 Device 0   nicotine  (NICODERM CQ  - DOSED IN MG/24 HOURS) 21 mg/24hr patch Place 1 patch (21 mg total) onto the skin daily. 28 patch 1   valsartan  (DIOVAN ) 160 MG tablet TAKE 1/2 TABLET (80 MG TOTAL) BY MOUTH DAILY. 45 tablet 1   gabapentin  (NEURONTIN ) 300 MG capsule Take 1 capsule (300 mg total) by mouth 2 (two) times daily AND 2 capsules (600 mg total) at bedtime. Do all this for 14 days. 56 capsule 0   No current facility-administered medications on file prior to visit.    No Known Allergies  Social History   Socioeconomic History   Marital  status: Single    Spouse name: Not on file   Number of children: Not on file   Years of education: Not on file   Highest education level: GED or equivalent  Occupational History   Not on file  Tobacco Use   Smoking status: Every Day    Current packs/day: 0.25    Average packs/day: 0.3 packs/day for 14.0 years (3.5 ttl pk-yrs)    Types: Cigarettes   Smokeless tobacco: Never   Tobacco comments:    Smoking 6-7 cigs per day  Vaping Use   Vaping status: Never Used  Substance and Sexual Activity   Alcohol use: Yes    Comment: 5-6 drinks per day on weekends   Drug use: Yes    Types: Marijuana   Sexual activity: Not on file  Other Topics Concern   Not on file  Social History Narrative   Not on file   Social Drivers of Health   Financial Resource Strain: Medium Risk (01/05/2024)   Overall Financial Resource Strain (CARDIA)    Difficulty of Paying Living Expenses: Somewhat hard  Food Insecurity: Food Insecurity Present (01/05/2024)   Hunger Vital Sign    Worried About Running Out of Food in the Last Year: Sometimes true    Ran Out of Food in the Last Year: Sometimes true  Transportation Needs: No Transportation Needs (01/05/2024)   PRAPARE - Administrator, Civil Service (Medical): No    Lack of Transportation (Non-Medical): No  Physical Activity: Inactive (01/05/2024)   Exercise Vital Sign    Days of Exercise per Week: 0 days    Minutes of Exercise per Session: 0 min  Stress: No Stress Concern Present (01/05/2024)   [employer] of Occupational Health - Occupational Stress Questionnaire    Feeling of Stress : Not at all  Social Connections: Moderately Isolated (01/05/2024)   Social Connection and Isolation Panel [NHANES]    Frequency of Communication with Friends and Family: Three times a week    Frequency of Social Gatherings with Friends and Family: Twice a week    Attends Religious Services: More than 4 times per year    Active Member of [employer] or Organizations: No     Attends Banker Meetings: Never    Marital Status: Never married  Intimate Partner Violence: Not At Risk (10/29/2023)   Humiliation, Afraid, Rape, and Kick questionnaire    Fear of Current or Ex-Partner: No    Emotionally Abused: No    Physically Abused: No    Sexually Abused: No    Family History  Problem Relation Age of Onset   Diabetes Mother    Hyperlipidemia Mother    Heart disease Mother    Diabetes Father    Diabetes Maternal Grandmother     Past Surgical History:  Procedure Laterality Date   BACK SURGERY     Bone fusion   BREAST LUMPECTOMY WITH RADIOACTIVE SEED LOCALIZATION Left 03/09/2022   Procedure: LEFT BREAST LUMPECTOMY WITH RADIOACTIVE SEED LOCALIZATION;  Surgeon: Curvin Mt III, MD;  Location: [facility];  Service: General;  Laterality: Left;    ROS: Review of Systems Negative except as stated above  PHYSICAL EXAM: BP 135/77   Pulse 61   Temp 98.2 F (36.8 C) (Oral)   Ht 5' 7 (1.702 m)   Wt 249 lb (112.9 kg)   SpO2 100%   BMI 39.00 kg/m   Wt Readings from Last 3 Encounters:  01/06/24 249 lb (112.9 kg)  10/29/23 240 lb (108.9 kg)  09/05/23 245 lb (111.1 kg)    Physical Exam  General appearance - alert, well appearing, and in no distress Mental status - normal mood, behavior, speech, dress, motor activity, and thought processes Neck - supple, no significant adenopathy Chest - clear to auscultation, no wheezes, rales or rhonchi, symmetric air entry Heart - normal rate, regular rhythm, normal S1, S2, no murmurs, rubs, clicks or gallops Extremities - peripheral pulses normal, no pedal edema, no clubbing or cyanosis Diabetic Foot Exam - Simple  Simple Foot Form Diabetic Foot exam was performed with the following findings: Yes 01/06/2024  6:14 PM  Visual Inspection See comments: Yes Sensation Testing Intact to touch and monofilament testing bilaterally: Yes Pulse Check Posterior Tibialis and Dorsalis pulse intact  bilaterally: Yes Comments Flat-footed.  On the sole of the right foot she has 3 calluses about 3 cm in size with a hardcore that is tender to touch.  1 is located on the plantar surface of the big toe, the other 2 are located on the ball of the foot 1 medially and 1 laterally. On the left foot, she has 1 callus on the ball of the foot. Toenails are slightly overgrown.         Latest Ref Rng & Units 06/05/2023    3:00 PM 09/18/2022    4:53 PM 09/07/2022    1:37 AM  CMP  Glucose 70 - 99 mg/dL 886  78  818   BUN 6 - 24 mg/dL 20  11  13    Creatinine 0.57 - 1.00 mg/dL 9.07  9.41  8.95   Sodium 134 - 144 mmol/L 140  139  142   Potassium 3.5 - 5.2 mmol/L 4.7  3.4  4.1   Chloride 96 - 106 mmol/L 101  102  101   CO2 20 - 29 mmol/L 26  28  25    Calcium  8.7 - 10.2 mg/dL 9.8  9.6  9.7   Total Protein 6.0 - 8.5 g/dL 7.4  7.4  8.1   Total Bilirubin 0.0 - 1.2 mg/dL 0.4  0.5  0.2   Alkaline Phos 44 - 121 IU/L 93  61  74   AST 0 - 40 IU/L 32  23  25   ALT 0 - 32 IU/L 25  20  21     Lipid Panel     Component Value Date/Time   CHOL 154 09/05/2023 1523   TRIG 133 09/05/2023 1523   HDL 56 09/05/2023 1523   CHOLHDL 2.8 09/05/2023 1523   CHOLHDL 3.2 01/23/2016 0956   VLDL 9 01/23/2016 0956   LDLCALC 75 09/05/2023 1523    CBC    Component Value Date/Time   WBC 6.3 09/05/2023 1523   WBC 6.8 09/18/2022 1653   RBC 4.73 09/05/2023 1523   RBC 4.82 09/18/2022 1653   HGB 13.2 09/05/2023 1523   HCT 39.7 09/05/2023 1523   PLT 257 09/05/2023 1523   MCV 84 09/05/2023 1523   MCH 27.9 09/05/2023 1523   MCH 27.8 09/18/2022 1653   MCHC 33.2 09/05/2023 1523   MCHC 32.6 09/18/2022 1653   RDW 13.5 09/05/2023 1523   LYMPHSABS 1.3 09/07/2022 0137   MONOABS 0.8 09/07/2022 0137   EOSABS 0.0 09/07/2022 0137   BASOSABS 0.1 09/07/2022 0137    ASSESSMENT AND PLAN: 1. Type 2 diabetes mellitus with morbid obesity (HCC) (Primary) -Given a pop tart today to increase her blood sugar before she left. -Start  using continuous glucose monitor tomorrow. -Check blood sugars manually until continuous glucose monitor is in use. -Encourage regular meals and healthier snacks.  Avoid skipping meals. -If hypoglycemia continues, decrease Glimepiride  2mg  to half a tablet daily. - POCT glycosylated hemoglobin (Hb A1C) - POCT glucose (manual entry) - Ambulatory referral to Ophthalmology - POCT glucose (manual entry)  2. Diabetes mellitus treated with oral medication (HCC) See #1 above  3. Diabetic peripheral neuropathy [facility]) Advised patient that it is unlikely that the neuropathy will completely resolve and that she would be back to  100% of her baseline.  Recommend taking the gabapentin  300 mg 1 tablet in the morning, 1 tablet at noon and 2 at bedtime. - lidocaine  (XYLOCAINE ) 5 % ointment; Apply 1 Application topically as needed.  Dispense: 35.44 g; Refill: 0  4. Hypertension associated with type 2 diabetes mellitus (HCC) Repeat reading today closer to goal.  Continue Norvasc  and Diovan .  5. Tobacco dependence Commended her on cutting back.  Strongly encouraged her to quit  6. Hyperlipidemia associated with type 2 diabetes mellitus (HCC) Continue atorvastatin  20 mg daily  7. Pre-ulcerative corn or callous We will get her back in with podiatry to have this callus shaved down  8. Encounter for screening mammogram for malignant neoplasm of breast - MM Digital Screening; Future  9. Screening for colon cancer - Ambulatory referral to Gastroenterology   Patient was given the opportunity to ask questions.  Patient verbalized understanding of the plan and was able to repeat key elements of the plan.   This documentation was completed using Paediatric nurse.  Any transcriptional errors are unintentional.  Orders Placed This Encounter  Procedures   MM Digital Screening   Ambulatory referral to Ophthalmology   Ambulatory referral to Gastroenterology   POCT glycosylated hemoglobin  (Hb A1C)   POCT glucose (manual entry)   POCT glucose (manual entry)     Requested Prescriptions   Signed Prescriptions Disp Refills   lidocaine  (XYLOCAINE ) 5 % ointment 35.44 g 0    Sig: Apply 1 Application topically as needed.    Return in about 4 months (around 05/05/2024).  Barnie Louder, MD, FACP

## 2024-01-06 NOTE — Patient Instructions (Signed)
 Please start using your continuous glucose monitor. Start taking the Gabapentin as 1 tablet in morning, one at noon and 2 at bedtime. Referred for eye exam, for mammogram, back to podiatry.

## 2024-01-15 ENCOUNTER — Ambulatory Visit
Admission: RE | Admit: 2024-01-15 | Discharge: 2024-01-15 | Disposition: A | Discharge status: Home / Self Care | Payer: 59 | Source: Ambulatory Visit | Attending: Internal Medicine | Admitting: Internal Medicine

## 2024-01-15 DIAGNOSIS — Z1231 Encounter for screening mammogram for malignant neoplasm of breast: Secondary | ICD-10-CM

## 2024-01-19 ENCOUNTER — Other Ambulatory Visit: Payer: Self-pay | Admitting: Internal Medicine

## 2024-01-19 DIAGNOSIS — I10 Essential (primary) hypertension: Secondary | ICD-10-CM

## 2024-01-21 ENCOUNTER — Ambulatory Visit: Payer: 59 | Admitting: Podiatry

## 2024-01-26 DIAGNOSIS — Z79899 Other long term (current) drug therapy: Secondary | ICD-10-CM | POA: Diagnosis not present

## 2024-01-26 DIAGNOSIS — F17210 Nicotine dependence, cigarettes, uncomplicated: Secondary | ICD-10-CM | POA: Diagnosis not present

## 2024-01-26 DIAGNOSIS — Z9181 History of falling: Secondary | ICD-10-CM | POA: Diagnosis not present

## 2024-01-26 DIAGNOSIS — Z0000 Encounter for general adult medical examination without abnormal findings: Secondary | ICD-10-CM | POA: Diagnosis not present

## 2024-01-26 DIAGNOSIS — G629 Polyneuropathy, unspecified: Secondary | ICD-10-CM | POA: Diagnosis not present

## 2024-01-26 DIAGNOSIS — M1711 Unilateral primary osteoarthritis, right knee: Secondary | ICD-10-CM | POA: Diagnosis not present

## 2024-01-26 DIAGNOSIS — M47816 Spondylosis without myelopathy or radiculopathy, lumbar region: Secondary | ICD-10-CM | POA: Diagnosis not present

## 2024-01-26 DIAGNOSIS — E1169 Type 2 diabetes mellitus with other specified complication: Secondary | ICD-10-CM | POA: Diagnosis not present

## 2024-01-28 ENCOUNTER — Ambulatory Visit (INDEPENDENT_AMBULATORY_CARE_PROVIDER_SITE_OTHER): Payer: 59 | Admitting: Podiatry

## 2024-01-28 ENCOUNTER — Encounter: Payer: Self-pay | Admitting: Podiatry

## 2024-01-28 DIAGNOSIS — E1169 Type 2 diabetes mellitus with other specified complication: Secondary | ICD-10-CM

## 2024-01-28 DIAGNOSIS — E669 Obesity, unspecified: Secondary | ICD-10-CM

## 2024-01-28 DIAGNOSIS — M2142 Flat foot [pes planus] (acquired), left foot: Secondary | ICD-10-CM

## 2024-01-28 DIAGNOSIS — E1142 Type 2 diabetes mellitus with diabetic polyneuropathy: Secondary | ICD-10-CM

## 2024-01-28 DIAGNOSIS — M2141 Flat foot [pes planus] (acquired), right foot: Secondary | ICD-10-CM

## 2024-01-28 DIAGNOSIS — L84 Corns and callosities: Secondary | ICD-10-CM | POA: Diagnosis not present

## 2024-01-28 MED ORDER — LIDOCAINE 5 % EX OINT: 1.0000 | TOPICAL_OINTMENT | CUTANEOUS | 6 refills | Status: DC | PRN

## 2024-01-30 NOTE — Progress Notes (Signed)
  Subjective:  Patient ID: Karen Tanner, female    DOB: 12-25-1971,  MRN: [id number]  Chief Complaint  Patient presents with   Callouses    Patient states that her bilateral feet she has Callouses on the bottom of bilateral feet, and they really hurt a lot, patient is taking gabapentin for pain. Patient ran out of the cream the doctor gave her for her feet.    53 y.o. female presents with the above complaint. History confirmed with patient.  The lidocaine ointment was helpful.  Objective:  Physical Exam: warm, good capillary refill, no trophic changes or ulcerative lesions, normal DP and PT pulses, and diffuse polyneuropathy with paresthesias and decreased sensation, she has pes planus deformity and multiple painful hyperkeratotic lesions left submetatarsal 5, right submetatarsal 5 submetatarsal 4 submetatarsal 1 and medial hallux.  Assessment:   1. Callus of foot   2. Diabetic peripheral neuropathy (HCC)   3. Type 2 diabetes mellitus with obesity (HCC)   4. Pes planus of both feet      Plan:  Patient was evaluated and treated and all questions answered.  All symptomatic hyperkeratoses were safely debrided with a sterile #15 blade to patient's level of comfort without incident. We discussed preventative and palliative care of these lesions including supportive and accommodative shoegear, padding, prefabricated and custom molded accommodative orthoses, use of a pumice stone and lotions/creams daily.  May benefit from diabetic shoes and scheduled for fitting for these and regular debridement of these.  Refill lidocaine 1% to pharmacy to use as needed for neuropathy pain  Return in about 3 months (around 04/27/2024) for at risk diabetic foot care.

## 2024-01-31 DIAGNOSIS — Z79899 Other long term (current) drug therapy: Secondary | ICD-10-CM | POA: Diagnosis not present

## 2024-02-22 DIAGNOSIS — E1169 Type 2 diabetes mellitus with other specified complication: Secondary | ICD-10-CM | POA: Diagnosis not present

## 2024-02-22 DIAGNOSIS — Z9181 History of falling: Secondary | ICD-10-CM | POA: Diagnosis not present

## 2024-02-22 DIAGNOSIS — M1711 Unilateral primary osteoarthritis, right knee: Secondary | ICD-10-CM | POA: Diagnosis not present

## 2024-02-22 DIAGNOSIS — F17210 Nicotine dependence, cigarettes, uncomplicated: Secondary | ICD-10-CM | POA: Diagnosis not present

## 2024-02-22 DIAGNOSIS — Z79899 Other long term (current) drug therapy: Secondary | ICD-10-CM | POA: Diagnosis not present

## 2024-02-22 DIAGNOSIS — M47816 Spondylosis without myelopathy or radiculopathy, lumbar region: Secondary | ICD-10-CM | POA: Diagnosis not present

## 2024-02-22 DIAGNOSIS — G629 Polyneuropathy, unspecified: Secondary | ICD-10-CM | POA: Diagnosis not present

## 2024-02-26 ENCOUNTER — Ambulatory Visit: Payer: 59

## 2024-02-26 DIAGNOSIS — Z79899 Other long term (current) drug therapy: Secondary | ICD-10-CM | POA: Diagnosis not present

## 2024-02-26 DIAGNOSIS — E1142 Type 2 diabetes mellitus with diabetic polyneuropathy: Secondary | ICD-10-CM

## 2024-02-26 DIAGNOSIS — M2141 Flat foot [pes planus] (acquired), right foot: Secondary | ICD-10-CM

## 2024-02-26 DIAGNOSIS — L84 Corns and callosities: Secondary | ICD-10-CM

## 2024-02-26 NOTE — Progress Notes (Signed)
 Patient presents to the office today for diabetic shoe and insole measuring.  Patient was measured with brannock device to determine size and width for 1 pair of extra depth shoes and foam casted for 3 pair of insoles.   Documentation of medical necessity will be sent to patient's treating diabetic doctor to verify and sign.   Patient's diabetic provider: Jonah Blue MD   Shoes and insoles will be ordered at that time and patient will be notified for an appointment for fitting when they arrive.   Shoe size (per patient): 12 WD Patient shoe selection- Shoe choice:   [id number] / Purple [place] Shoe size ordered: 12d  Ppw and ABN signed

## 2024-03-03 ENCOUNTER — Telehealth: Payer: Self-pay

## 2024-03-03 NOTE — Telephone Encounter (Signed)
 Copied from CRM [id number]. Topic: General - Other >> Mar 03, 2024  2:40 PM Fredrica W wrote: Reason for CRM: Patient called states she was seen last week at [facility] and they want her to get diabetic shoes but need authorization from pcp. Patient states they were supposed to send request to pcp. Is checking to see if that has been received and in process or what next steps should be. Would like a call back. Thank You

## 2024-03-04 NOTE — Telephone Encounter (Signed)
 Form completed and will be left on your desk.

## 2024-03-06 NOTE — Telephone Encounter (Signed)
 Form completed and successfully faxed to [facility] on 03/05/2024.

## 2024-03-11 ENCOUNTER — Other Ambulatory Visit: Payer: Self-pay | Admitting: Physician Assistant

## 2024-03-11 DIAGNOSIS — E1169 Type 2 diabetes mellitus with other specified complication: Secondary | ICD-10-CM

## 2024-03-12 NOTE — Telephone Encounter (Signed)
 Requested Prescriptions  Refused Prescriptions Disp Refills   atorvastatin (LIPITOR) 20 MG tablet [Pharmacy Med Name: Atorvastatin Calcium 20 MG Oral Tablet] 100 tablet 2    Sig: TAKE 1 TABLET BY MOUTH DAILY     Cardiovascular:  Antilipid - Statins Failed - 03/12/2024 11:52 AM      Failed - Lipid Panel in normal range within the last 12 months    Cholesterol, Total  Date Value Ref Range Status  09/05/2023 154 100 - 199 mg/dL Final   LDL Chol Calc (NIH)  Date Value Ref Range Status  09/05/2023 75 0 - 99 mg/dL Final   HDL  Date Value Ref Range Status  09/05/2023 56 >39 mg/dL Final   Triglycerides  Date Value Ref Range Status  09/05/2023 133 0 - 149 mg/dL Final         Passed - Patient is not pregnant      Passed - Valid encounter within last 12 months    Recent Outpatient Visits           2 months ago Type 2 diabetes mellitus with morbid obesity (HCC)   [place] [facility] Dept Of [place]. [facility] Marcine Matar, MD   4 months ago Toe pain, left   [place] [facility] [place] - A Dept Of [place]. [facility] Jonah Blue B, MD   6 months ago Type 2 diabetes mellitus with obesity [facility])   [place] [facility] - A Dept Of [place]. [facility] Jonah Blue B, MD   9 months ago Type 2 diabetes mellitus with obesity [facility])   [place] [facility] - A Dept Of [place]. [facility] [place], [place], [place]   1 year ago Type 2 diabetes mellitus with morbid obesity [facility])    [facility] - A Dept Of [place]. [facility] Marcine Matar, MD       Future Appointments             In 1 month Laural Benes Binnie Rail, MD [facility] [place] - A Dept Of Eligha Bridegroom. [facility]

## 2024-03-19 DIAGNOSIS — Z9181 History of falling: Secondary | ICD-10-CM | POA: Diagnosis not present

## 2024-03-19 DIAGNOSIS — M47816 Spondylosis without myelopathy or radiculopathy, lumbar region: Secondary | ICD-10-CM | POA: Diagnosis not present

## 2024-03-19 DIAGNOSIS — Z79899 Other long term (current) drug therapy: Secondary | ICD-10-CM | POA: Diagnosis not present

## 2024-03-19 DIAGNOSIS — F17210 Nicotine dependence, cigarettes, uncomplicated: Secondary | ICD-10-CM | POA: Diagnosis not present

## 2024-03-20 LAB — FECAL OCCULT BLOOD, IMMUNOCHEMICAL: IFOBT: NEGATIVE

## 2024-03-21 ENCOUNTER — Other Ambulatory Visit: Payer: Self-pay | Admitting: Internal Medicine

## 2024-03-21 DIAGNOSIS — I10 Essential (primary) hypertension: Secondary | ICD-10-CM

## 2024-03-23 DIAGNOSIS — Z79899 Other long term (current) drug therapy: Secondary | ICD-10-CM | POA: Diagnosis not present

## 2024-03-23 NOTE — Telephone Encounter (Signed)
 Requested Prescriptions  Pending Prescriptions Disp Refills   amLODipine (NORVASC) 10 MG tablet [Pharmacy Med Name: amLODIPine Besylate 10 MG Oral Tablet] 100 tablet 0    Sig: TAKE 1 TABLET BY MOUTH DAILY     Cardiovascular: Calcium Channel Blockers 2 Passed - 03/23/2024  4:24 PM      Passed - Last BP in normal range    BP Readings from Last 1 Encounters:  01/06/24 135/77         Passed - Last Heart Rate in normal range    Pulse Readings from Last 1 Encounters:  01/06/24 61         Passed - Valid encounter within last 6 months    Recent Outpatient Visits           2 months ago Type 2 diabetes mellitus with morbid obesity (HCC)   [place] [facility] - A Dept Of [place]. [facility] Marcine Matar, MD   4 months ago Toe pain, left   [place] [facility] [place] - A Dept Of [place]. [facility] Jonah Blue B, MD   6 months ago Type 2 diabetes mellitus with obesity [facility])   [place] [facility] - A Dept Of [place]. [facility] Jonah Blue B, MD   9 months ago Type 2 diabetes mellitus with obesity [facility])   [place] [facility] - A Dept Of [place]. [facility] [place], [place], [place]   1 year ago Type 2 diabetes mellitus with morbid obesity [facility])   [place] [facility] - A Dept Of . [facility] Marcine Matar, MD       Future Appointments             In 1 month Laural Benes Binnie Rail, MD [facility] [place] - A Dept Of [place] Bridegroom. [facility]

## 2024-03-24 ENCOUNTER — Other Ambulatory Visit: Payer: Self-pay | Admitting: Physician Assistant

## 2024-03-24 DIAGNOSIS — I10 Essential (primary) hypertension: Secondary | ICD-10-CM

## 2024-03-24 DIAGNOSIS — E1169 Type 2 diabetes mellitus with other specified complication: Secondary | ICD-10-CM

## 2024-04-05 ENCOUNTER — Other Ambulatory Visit: Payer: Self-pay | Admitting: Internal Medicine

## 2024-04-05 DIAGNOSIS — M545 Low back pain, unspecified: Secondary | ICD-10-CM

## 2024-04-05 DIAGNOSIS — M1711 Unilateral primary osteoarthritis, right knee: Secondary | ICD-10-CM

## 2024-04-06 NOTE — Telephone Encounter (Signed)
 Requested Prescriptions  Pending Prescriptions Disp Refills   meloxicam (MOBIC) 15 MG tablet [Pharmacy Med Name: Meloxicam 15 MG Oral Tablet] 100 tablet 0    Sig: TAKE 1 TABLET BY MOUTH DAILY     Analgesics:  COX2 Inhibitors Failed - 04/06/2024  5:27 PM      Failed - Manual Review: Labs are only required if the patient has taken medication for more than 8 weeks.      Passed - HGB in normal range and within 360 days    Hemoglobin  Date Value Ref Range Status  09/05/2023 13.2 11.1 - 15.9 g/dL Final         Passed - Cr in normal range and within 360 days    Creat  Date Value Ref Range Status  06/13/2016 0.59 0.50 - 1.10 mg/dL Final   Creatinine, Ser  Date Value Ref Range Status  06/05/2023 0.92 0.57 - 1.00 mg/dL Final   Creatinine, POC  Date Value Ref Range Status  06/19/2023 187.2 mg/dL Final    Comment:    Abstracted by HIM         Passed - HCT in normal range and within 360 days    Hematocrit  Date Value Ref Range Status  09/05/2023 39.7 34.0 - 46.6 % Final         Passed - AST in normal range and within 360 days    AST  Date Value Ref Range Status  06/05/2023 32 0 - 40 IU/L Final         Passed - ALT in normal range and within 360 days    ALT  Date Value Ref Range Status  06/05/2023 25 0 - 32 IU/L Final         Passed - eGFR is 30 or above and within 360 days    GFR, Est African American  Date Value Ref Range Status  03/18/2015 >89 mL/min Final   GFR calc Af Amer  Date Value Ref Range Status  08/21/2018 115 >59 mL/min/1.73 Final   GFR, Est Non African American  Date Value Ref Range Status  03/18/2015 >89 mL/min Final    Comment:      The estimated GFR is a calculation valid for adults (>=49 years old) that uses the CKD-EPI algorithm to adjust for age and sex. It is   not to be used for children, pregnant women, hospitalized patients,    patients on dialysis, or with rapidly changing kidney function. According to the NKDEP, eGFR >89 is normal, 60-89  shows mild impairment, 30-59 shows moderate impairment, 15-29 shows severe impairment and <15 is ESRD.      GFR, Estimated  Date Value Ref Range Status  09/18/2022 >60 >60 mL/min Final    Comment:    (NOTE) Calculated using the CKD-EPI Creatinine Equation (2021)    eGFR  Date Value Ref Range Status  06/05/2023 74 >59 mL/min/1.73 Final   EGFR  Date Value Ref Range Status  06/19/2023 132.0  Final         Passed - Patient is not pregnant      Passed - Valid encounter within last 12 months    Recent Outpatient Visits           3 months ago Type 2 diabetes mellitus with morbid obesity (HCC)   [place] [facility] Dept Of [place]. [facility] Jonah Blue B, MD   5 months ago Toe pain, left   [place] [facility] - A Dept Of [place]. [facility] Jonah Blue B, MD   7 months ago Type 2 diabetes mellitus with obesity [facility])   [place] [facility] Proud - A Dept Of [place]. [facility] Jonah Blue B, MD   10 months ago Type 2 diabetes mellitus with obesity [facility])   [place] [facility] - A Dept Of [place]. [facility] [place], [place], [place]   1 year ago Type 2 diabetes mellitus with morbid obesity [facility])   [place] [facility] - A Dept Of . [facility] Marcine Matar, MD       Future Appointments             In 4 weeks Marcine Matar, MD [facility] [place] - A Dept Of [place]igha Bridegroom. [place]

## 2024-04-14 ENCOUNTER — Encounter: Payer: Self-pay | Admitting: Pharmacist

## 2024-04-14 ENCOUNTER — Other Ambulatory Visit (HOSPITAL_BASED_OUTPATIENT_CLINIC_OR_DEPARTMENT_OTHER): Admitting: Pharmacist

## 2024-04-14 ENCOUNTER — Other Ambulatory Visit: Payer: Self-pay | Admitting: Internal Medicine

## 2024-04-14 DIAGNOSIS — I10 Essential (primary) hypertension: Secondary | ICD-10-CM

## 2024-04-14 DIAGNOSIS — E1142 Type 2 diabetes mellitus with diabetic polyneuropathy: Secondary | ICD-10-CM

## 2024-04-14 NOTE — Progress Notes (Signed)
 Patient appeared on insurance report for not passing the quality metrics in 2024: Medication Adherence for Hypertension [facility])   Outreach to the patient was Successful. Patient has filled valsartan once so far in 2025 (02/05/2024) for a 90-ds. Reminder set to contact patient next month for refills.   Marene Shape, PharmD, Becky Bowels, CPP Clinical Pharmacist [facility] & [facility] [phone]

## 2024-04-15 NOTE — Telephone Encounter (Signed)
 Requested Prescriptions  Pending Prescriptions Disp Refills   glimepiride (AMARYL) 2 MG tablet [Pharmacy Med Name: Glimepiride 2 MG Oral Tablet] 100 tablet 0    Sig: TAKE 1 TABLET BY MOUTH DAILY  WITH BREAKFAST     Endocrinology:  Diabetes - Sulfonylureas Passed - 04/15/2024  8:39 AM      Passed - HBA1C is between 0 and 7.9 and within 180 days    HbA1c, POC (controlled diabetic range)  Date Value Ref Range Status  01/06/2024 6.9 0.0 - 7.0 % Final         Passed - Cr in normal range and within 360 days    Creat  Date Value Ref Range Status  06/13/2016 0.59 0.50 - 1.10 mg/dL Final   Creatinine, Ser  Date Value Ref Range Status  06/05/2023 0.92 0.57 - 1.00 mg/dL Final   Creatinine, POC  Date Value Ref Range Status  06/19/2023 187.2 mg/dL Final    Comment:    Abstracted by HIM         Passed - Valid encounter within last 6 months    Recent Outpatient Visits           3 months ago Type 2 diabetes mellitus with morbid obesity (HCC)   [place] [facility] Dept Of [place]. [facility] Lawrance Presume, MD   5 months ago Toe pain, left   [place] [facility] - A Dept Of [place]. [facility] Concetta Dee B, MD   7 months ago Type 2 diabetes mellitus with obesity [facility])   [place] [facility] Grout - A Dept Of [place]. [facility] Concetta Dee B, MD   10 months ago Type 2 diabetes mellitus with obesity [facility])   [place] [facility] Gr[facility] - A Dept Of [place]. [facility] [place], [place], [place]   1 year ago Type 2 diabetes mellitus with morbid obesity [facility])    Comm Health Vivien Grout - A Dept Of [place]. [facility] Lawrance Presume, MD       Future Appointments             In 2 weeks Lawrance Presume, MD [facility] [place] - A Dept Of [place]. [facility]

## 2024-04-16 ENCOUNTER — Ambulatory Visit

## 2024-04-16 DIAGNOSIS — L84 Corns and callosities: Secondary | ICD-10-CM | POA: Diagnosis not present

## 2024-04-16 DIAGNOSIS — E1142 Type 2 diabetes mellitus with diabetic polyneuropathy: Secondary | ICD-10-CM | POA: Diagnosis not present

## 2024-04-16 DIAGNOSIS — M2142 Flat foot [pes planus] (acquired), left foot: Secondary | ICD-10-CM | POA: Diagnosis not present

## 2024-04-16 DIAGNOSIS — M2141 Flat foot [pes planus] (acquired), right foot: Secondary | ICD-10-CM

## 2024-04-17 NOTE — Progress Notes (Signed)
 Patient presents today to pick up diabetic shoes and insoles.  Patient was dispensed 1 pair of diabetic shoes and 3 pairs of total contact diabetic insoles. Fit was satisfactory. Instructions for break-in and wear was reviewed and a copy was given to the patient.   Re-appointment for regularly scheduled diabetic foot care visits or if they should experience any trouble with the shoes or insoles.  Britton Cane CPed, CFo, CFm

## 2024-04-20 DIAGNOSIS — G629 Polyneuropathy, unspecified: Secondary | ICD-10-CM | POA: Diagnosis not present

## 2024-04-20 DIAGNOSIS — M1711 Unilateral primary osteoarthritis, right knee: Secondary | ICD-10-CM | POA: Diagnosis not present

## 2024-04-20 DIAGNOSIS — F17210 Nicotine dependence, cigarettes, uncomplicated: Secondary | ICD-10-CM | POA: Diagnosis not present

## 2024-04-20 DIAGNOSIS — Z79899 Other long term (current) drug therapy: Secondary | ICD-10-CM | POA: Diagnosis not present

## 2024-04-20 DIAGNOSIS — M47816 Spondylosis without myelopathy or radiculopathy, lumbar region: Secondary | ICD-10-CM | POA: Diagnosis not present

## 2024-04-20 DIAGNOSIS — E1169 Type 2 diabetes mellitus with other specified complication: Secondary | ICD-10-CM | POA: Diagnosis not present

## 2024-04-20 DIAGNOSIS — Z9181 History of falling: Secondary | ICD-10-CM | POA: Diagnosis not present

## 2024-04-27 DIAGNOSIS — Z79899 Other long term (current) drug therapy: Secondary | ICD-10-CM | POA: Diagnosis not present

## 2024-04-28 ENCOUNTER — Encounter: Payer: Self-pay | Admitting: Podiatry

## 2024-04-28 ENCOUNTER — Ambulatory Visit (INDEPENDENT_AMBULATORY_CARE_PROVIDER_SITE_OTHER): Payer: 59 | Admitting: Podiatry

## 2024-04-28 DIAGNOSIS — E1142 Type 2 diabetes mellitus with diabetic polyneuropathy: Secondary | ICD-10-CM

## 2024-04-28 DIAGNOSIS — E669 Obesity, unspecified: Secondary | ICD-10-CM

## 2024-04-28 DIAGNOSIS — E1169 Type 2 diabetes mellitus with other specified complication: Secondary | ICD-10-CM

## 2024-04-28 DIAGNOSIS — L84 Corns and callosities: Secondary | ICD-10-CM

## 2024-04-28 MED ORDER — LIDOCAINE 5 % EX OINT: 1.0000 | TOPICAL_OINTMENT | CUTANEOUS | 6 refills | Status: DC | PRN

## 2024-04-28 NOTE — Progress Notes (Signed)
 This patient presents to the office for painful callus on both feet.  Patient says the callus are painful walking and wearing her shoes.  She was treated with debridement previous visit.She also has obtained diabetic shoes.  She has history of diabetes.  She was also prescribed lidocaine  cream.  She presents for evaluation and treatment.  General Appearance  Alert, conversant and in no acute stress.  Vascular  Dorsalis pedis and posterior tibial  pulses are palpable  bilaterally.  Capillary return is within normal limits  bilaterally. Temperature is within normal limits  bilaterally.  Neurologic  Senn-Weinstein monofilament wire test within normal limits  bilaterally. Muscle power within normal limits bilaterally.  Nails Thick disfigured discolored nails with subungual debris  from hallux to fifth toes bilaterally. No evidence of bacterial infection or drainage bilaterally.  Orthopedic  No limitations of motion  feet .  No crepitus or effusions noted.  No bony pathology or digital deformities noted. Plantar flexed 1st ray  B/L.  Sesamoiditis left foot.  Skin  normotropic skin with no porokeratosis noted bilaterally.  No signs of infections or ulcers noted.   Callus left hallux and sub sesamoid right foot.  No callus left sesamoid.    Callus B/L.    Debride callus with # 15 blade and dremel tool.  Padding applied to her diabetic insoles.   Prescribe lidocaine  cream.  RTC 3 months    .GAM

## 2024-05-05 ENCOUNTER — Ambulatory Visit: Payer: 59 | Attending: Internal Medicine | Admitting: Internal Medicine

## 2024-05-05 ENCOUNTER — Encounter: Payer: Self-pay | Admitting: Internal Medicine

## 2024-05-05 ENCOUNTER — Other Ambulatory Visit: Payer: Self-pay | Admitting: Internal Medicine

## 2024-05-05 DIAGNOSIS — E1169 Type 2 diabetes mellitus with other specified complication: Secondary | ICD-10-CM

## 2024-05-05 DIAGNOSIS — Z6841 Body Mass Index (BMI) 40.0 and over, adult: Secondary | ICD-10-CM

## 2024-05-05 DIAGNOSIS — Z7984 Long term (current) use of oral hypoglycemic drugs: Secondary | ICD-10-CM | POA: Diagnosis not present

## 2024-05-05 DIAGNOSIS — E119 Type 2 diabetes mellitus without complications: Secondary | ICD-10-CM

## 2024-05-05 DIAGNOSIS — Z2821 Immunization not carried out because of patient refusal: Secondary | ICD-10-CM

## 2024-05-05 DIAGNOSIS — Z7985 Long-term (current) use of injectable non-insulin antidiabetic drugs: Secondary | ICD-10-CM | POA: Diagnosis not present

## 2024-05-05 DIAGNOSIS — F17210 Nicotine dependence, cigarettes, uncomplicated: Secondary | ICD-10-CM | POA: Diagnosis not present

## 2024-05-05 DIAGNOSIS — E1142 Type 2 diabetes mellitus with diabetic polyneuropathy: Secondary | ICD-10-CM

## 2024-05-05 DIAGNOSIS — M1711 Unilateral primary osteoarthritis, right knee: Secondary | ICD-10-CM | POA: Diagnosis not present

## 2024-05-05 DIAGNOSIS — I10 Essential (primary) hypertension: Secondary | ICD-10-CM | POA: Diagnosis not present

## 2024-05-05 DIAGNOSIS — E1159 Type 2 diabetes mellitus with other circulatory complications: Secondary | ICD-10-CM

## 2024-05-05 DIAGNOSIS — E785 Hyperlipidemia, unspecified: Secondary | ICD-10-CM

## 2024-05-05 DIAGNOSIS — F17200 Nicotine dependence, unspecified, uncomplicated: Secondary | ICD-10-CM

## 2024-05-05 LAB — POCT GLYCOSYLATED HEMOGLOBIN (HGB A1C): HbA1c, POC (controlled diabetic range): 6.9 % (ref 0.0–7.0)

## 2024-05-05 LAB — GLUCOSE, POCT (MANUAL RESULT ENTRY): POC Glucose: 143 mg/dL — AB (ref 70–99)

## 2024-05-05 MED ORDER — VALSARTAN 80 MG PO TABS: 80.0000 mg | ORAL_TABLET | Freq: Every day | ORAL | 1 refills | Status: DC

## 2024-05-05 MED ORDER — ACCU-CHEK GUIDE W/DEVICE KIT: PACK | 0 refills | Status: AC

## 2024-05-05 MED ORDER — SEMAGLUTIDE(0.25 OR 0.5MG/DOS) 2 MG/3ML ~~LOC~~ SOPN: 0.2500 mg | PEN_INJECTOR | SUBCUTANEOUS | 3 refills | Status: DC

## 2024-05-05 NOTE — Patient Instructions (Addendum)
 Call 1-800QUITNOW to get nicotine  patches and gum for free.   Please call [facility] to schedule your eye appointment.  You no-showed appointment with them last month.  Ph # [phone] address [address]   We are starting you on Ozempic 0.25 mg to be injected in the abdomen once a week. The medication can cause nausea, vomiting, diarrhea/constipation, pancreatitis and bowel blockage.  Once you start the medication, if you develop any vomiting, severe diarrhea, abdominal pain, you should stop the medicine and come on in to be seen. Once you have been on Ozempic for 1 month, if you are tolerating the medication, please call and let me know so that we can increase the dose to the next level which would be 0.5 mg once a month.   You should restart the gabapentin  to help with your neuropathy symptoms.  Refill sent on valsartan  80 mg daily.

## 2024-05-05 NOTE — Progress Notes (Signed)
 Patient ID: [name], female    DOB: November 25, 1971  MRN: [id number]  CC: Diabetes (DM f/u. Med refills. /On-going neuropathy - diabetic shoes are not helping with pain/Right knee is "locking-up & giving out" - possible due to weight gain/Requesting disposable lancets/No to shingles vax)   Subjective: Kathlynn Partridge is a 53 y.o. female who presents for chronic ds management. Her concerns today include:  Patient with history of HTN, diabetes 2 with peripheral neuropathy, HL, tob dep, anemia, chronic LBP had fusion surgery 2004, OA RT knee, history of papilloma LT breast 02/2022 (does not need chemoprophylaxis per onc Dr. Arno Bibles), fibroid   HTN: Should be on amlodipine  10 mg daily and Diovan  160 mg 1/2 daily. Ran out of the 160 mg of Diovan  x 1 mth; went back to taking just 40 mg one tab daily as she still had some of the 40 mg tab at home.  Took both meds already for today.  Not checking BP but has device  HL: Taking and tolerating atorvastatin  20 mg daily. Last LDL was 75 in Sept 2024.  Tob Dep: has cut back on smoking.  Trying to quit.  Would like to try the nicotine  patches but reports they are too costly.  DM: Results for orders placed or performed in visit on 05/05/24  POCT glucose (manual entry)   Collection Time: 05/05/24  1:54 PM  Result Value Ref Range   POC Glucose 143 (A) 70 - 99 mg/dl  POCT glycosylated hemoglobin (Hb A1C)   Collection Time: 05/05/24  2:00 PM  Result Value Ref Range   Hemoglobin A1C     HbA1c POC (<> result, manual entry)     HbA1c, POC (prediabetic range)     HbA1c, POC (controlled diabetic range) 6.9 0.0 - 7.0 %  On Amaryl  2 mg daily; taking and tolerating. Told me on last visit that she had the CGM; however today she tells me that she does not have one. Needs new glucometer as the lancet device that came with it no longer works. -up 14 lbs since Jan. Eating habits not the best. Snacks on donuts sticks, chips etc Still bothered by neuropathy  symptoms in feet, burning in feet.  Followed by [facility].  Has DM shoes. On last visit, we left her with gabapentin  300 mg in the a.m. and at noon and 600 mg at nights.  She has not been taking the Gabapentin ; taking Robaxin  but not helping. She was also prescribed lidocaine  ointment by the podiatrist. Goes to pain management with [facility], on Hydrocodone and reports taking 10 mg BID for pain in knees, back.  She no-showed appointment with [facility] for diabetic eye exam last month.  She is agreeable to calling them to reschedule.  RT knee has been giving a lot of problems; giving out at times.  Would like to get back to [facility] to get injection.  HM: She did the fit test in March of this year instead of colonoscopy.  It was negative.  Declines shingles vaccine. Patient Active Problem List   Diagnosis Date Noted   Intraductal papilloma 12/20/2021   Drinking binge 04/17/2021   Tobacco dependence 04/17/2021   Hyperlipidemia associated with type 2 diabetes mellitus (HCC) 04/17/2021   Uterine leiomyoma 08/21/2018   Type 2 diabetes mellitus with obesity (HCC) 05/21/2016   History of back surgery 10/26/2014   History of anemia 10/26/2014   Essential hypertension 10/26/2014   Chronic low back pain 10/26/2014  Menorrhagia with regular cycle 10/26/2014     Current Outpatient Medications on File Prior to Visit  Medication Sig Dispense Refill   Accu-Chek Softclix Lancets lancets Use to check blood sugar once daily. Dx: E11.69 100 each 2   amLODipine  (NORVASC ) 10 MG tablet TAKE 1 TABLET BY MOUTH EVERY DAY 90 tablet 2   atorvastatin  (LIPITOR) 20 MG tablet TAKE 1 TABLET BY MOUTH EVERY DAY 90 tablet 2   Blood Pressure Monitor KIT Use to check blood pressure daily 1 kit 0   Continuous Glucose Receiver (FREESTYLE LIBRE 3 READER) DEVI UAD to check blood sugars 1 each 0   Continuous Glucose Sensor (FREESTYLE LIBRE 3 PLUS SENSOR) MISC Change sensor every 15 days. 2 each 11   gabapentin   (NEURONTIN ) 300 MG capsule TAKE 2 CAPSULES BY MOUTH 3 TIMES DAILY 360 capsule 5   glucose blood (ACCU-CHEK GUIDE) test strip Use to check blood sugar once daily. Dx: E11.69 100 each 12   HYDROcodone-acetaminophen  (NORCO) 10-325 MG tablet Take 1 tablet by mouth 3 (three) times daily as needed.     lidocaine  (XYLOCAINE ) 5 % ointment Apply 1 Application topically as needed. 50 g 6   meloxicam  (MOBIC ) 15 MG tablet TAKE 1 TABLET BY MOUTH DAILY 100 tablet 0   methocarbamol  (ROBAXIN ) 500 MG tablet TAKE 1 TABLET (500 MG TOTAL) BY MOUTH 2 (TWO) TIMES DAILY AS NEEDED FOR MUSCLE SPASMS. 60 tablet 0   Misc. Devices MISC Blood pressure device 1 Device 0   No current facility-administered medications on file prior to visit.    No Known Allergies  Social History   Socioeconomic History   Marital status: Single    Spouse name: Not on file   Number of children: Not on file   Years of education: Not on file   Highest education level: GED or equivalent  Occupational History   Not on file  Tobacco Use   Smoking status: Every Day    Current packs/day: 0.25    Average packs/day: 0.3 packs/day for 14.0 years (3.5 ttl pk-yrs)    Types: Cigarettes   Smokeless tobacco: Never   Tobacco comments:    Smoking 6-7 cigs per day  Vaping Use   Vaping status: Never Used  Substance and Sexual Activity   Alcohol use: Yes    Comment: 5-6 drinks per day on weekends   Drug use: Yes    Types: Marijuana   Sexual activity: Not on file  Other Topics Concern   Not on file  Social History Narrative   Not on file   Social Drivers of Health   Financial Resource Strain: Medium Risk (01/05/2024)   Overall Financial Resource Strain (CARDIA)    Difficulty of Paying Living Expenses: Somewhat hard  Food Insecurity: Food Insecurity Present (01/05/2024)   Hunger Vital Sign    Worried About Running Out of Food in the Last Year: Sometimes true    Ran Out of Food in the Last Year: Sometimes true  Transportation Needs: No  Transportation Needs (01/05/2024)   PRAPARE - Administrator, Civil Service (Medical): No    Lack of Transportation (Non-Medical): No  Physical Activity: Inactive (01/05/2024)   Exercise Vital Sign    Days of Exercise per Week: 0 days    Minutes of Exercise per Session: 0 min  Stress: No Stress Concern Present (01/05/2024)   [employer] of Occupational Health - Occupational Stress Questionnaire    Feeling of Stress : Not at all  Social Connections: Moderately  Isolated (01/05/2024)   Social Connection and Isolation Panel [NHANES]    Frequency of Communication with Friends and Family: Three times a week    Frequency of Social Gatherings with Friends and Family: Twice a week    Attends Religious Services: More than 4 times per year    Active Member of [employer] or Organizations: No    Attends Banker Meetings: Never    Marital Status: Never married  Intimate Partner Violence: Not At Risk (10/29/2023)   Humiliation, Afraid, Rape, and Kick questionnaire    Fear of Current or Ex-Partner: No    Emotionally Abused: No    Physically Abused: No    Sexually Abused: No    Family History  Problem Relation Age of Onset   Diabetes Mother    Hyperlipidemia Mother    Heart disease Mother    Diabetes Father    Diabetes Maternal Grandmother    BRCA 1/2 Neg Hx    Breast cancer Neg Hx     Past Surgical History:  Procedure Laterality Date   BACK SURGERY     Bone fusion   BREAST LUMPECTOMY Left 03/09/2022   intraductal papilloma with calcifications   BREAST LUMPECTOMY WITH RADIOACTIVE SEED LOCALIZATION Left 03/09/2022   Procedure: LEFT BREAST LUMPECTOMY WITH RADIOACTIVE SEED LOCALIZATION;  Surgeon: Caralyn Chandler, MD;  Location: [facility];  Service: General;  Laterality: Left;    ROS: Review of Systems Negative except as stated above  PHYSICAL EXAM: BP 137/79   Pulse (!) 59   Temp 97.8 F (36.6 C) (Oral)   Ht 5\' 7"  (1.702 m)   Wt 263 lb (119.3 kg)    LMP  (LMP Unknown) Comment: Patient states last period was "over a year ago"  SpO2 99%   BMI 41.19 kg/m   Physical Exam Wt Readings from Last 3 Encounters:  05/05/24 263 lb (119.3 kg)  01/06/24 249 lb (112.9 kg)  10/29/23 240 lb (108.9 kg)    General appearance - alert, well appearing, and in no distress Mental status - normal mood, behavior, speech, dress, motor activity, and thought processes Neck - supple, no significant adenopathy Chest - clear to auscultation, no wheezes, rales or rhonchi, symmetric air entry Heart - normal rate, regular rhythm, normal S1, S2, no murmurs, rubs, clicks or gallops Extremities - peripheral pulses normal, no pedal edema, no clubbing or cyanosis      Latest Ref Rng & Units 06/05/2023    3:00 PM 09/18/2022    4:53 PM 09/07/2022    1:37 AM  CMP  Glucose 70 - 99 mg/dL 161  78  096   BUN 6 - 24 mg/dL 20  11  13    Creatinine 0.57 - 1.00 mg/dL 0.45  4.09  8.11   Sodium 134 - 144 mmol/L 140  139  142   Potassium 3.5 - 5.2 mmol/L 4.7  3.4  4.1   Chloride 96 - 106 mmol/L 101  102  101   CO2 20 - 29 mmol/L 26  28  25    Calcium  8.7 - 10.2 mg/dL 9.8  9.6  9.7   Total Protein 6.0 - 8.5 g/dL 7.4  7.4  8.1   Total Bilirubin 0.0 - 1.2 mg/dL 0.4  0.5  0.2   Alkaline Phos 44 - 121 IU/L 93  61  74   AST 0 - 40 IU/L 32  23  25   ALT 0 - 32 IU/L 25  20  21  Lipid Panel     Component Value Date/Time   CHOL 154 09/05/2023 1523   TRIG 133 09/05/2023 1523   HDL 56 09/05/2023 1523   CHOLHDL 2.8 09/05/2023 1523   CHOLHDL 3.2 01/23/2016 0956   VLDL 9 01/23/2016 0956   LDLCALC 75 09/05/2023 1523    CBC    Component Value Date/Time   WBC 6.3 09/05/2023 1523   WBC 6.8 09/18/2022 1653   RBC 4.73 09/05/2023 1523   RBC 4.82 09/18/2022 1653   HGB 13.2 09/05/2023 1523   HCT 39.7 09/05/2023 1523   PLT 257 09/05/2023 1523   MCV 84 09/05/2023 1523   MCH 27.9 09/05/2023 1523   MCH 27.8 09/18/2022 1653   MCHC 33.2 09/05/2023 1523   MCHC 32.6 09/18/2022 1653    RDW 13.5 09/05/2023 1523   LYMPHSABS 1.3 09/07/2022 0137   MONOABS 0.8 09/07/2022 0137   EOSABS 0.0 09/07/2022 0137   BASOSABS 0.1 09/07/2022 0137    ASSESSMENT AND PLAN: 1. Type 2 diabetes mellitus with morbid obesity (HCC) (Primary) At goal. Discussed and encouraged healthy eating habits including recommending healthier snacks like fruits or nuts. Discussed trying her with Ozempic to help decrease her appetite and bring about some weight loss.  I went over with her how the medication works and potential side effects including bowel blockage, pancreatitis, severe diarrhea/constipation.  Advised to expect some nausea the first several weeks of being on the medicine.  However if she develops any vomiting, severe abdominal pain or severe diarrhea/constipation, she should stop the medicine and be seen immediately.  She is willing to try the medicine.  If our clinical pharmacist was not available to teach administration, advised that she can have the pharmacist show her how to do it when she picks up the medicine.  We will start with the 0.25 mg once a week.  Advised to take the medicine once a week on the same day of the week.  After being on the medicine for 1 month, if she is tolerating this dose, she can call and let me know so that we can increase the dose on the next refill.  After being on Ozempic for 1 month, she can stop the Amaryl .  Will bring her back in 2 months to see how she is doing. - POCT glycosylated hemoglobin (Hb A1C) - POCT glucose (manual entry) - Blood Glucose Monitoring Suppl (ACCU-CHEK GUIDE) w/Device KIT; Use to check blood sugar once daily. Dx: E11.69  Dispense: 1 kit; Refill: 0 - Semaglutide,0.25 or 0.5MG /DOS, 2 MG/3ML SOPN; Inject 0.25 mg into the skin once a week.  Dispense: 3 mL; Refill: 3  2. Diabetes mellitus treated with oral medication (HCC) See #1 above.  3. Diabetic peripheral neuropathy (HCC) Recommend that she restart the gabapentin  to help decrease neuropathy  symptoms.  4. Hypertension associated with type 2 diabetes mellitus (HCC) Not at goal.  She should be on valsartan  80 mg daily but ran out and reverted back to taking 40 mg of pills that she had left.  Refill sent to the pharmacy for the 80 mg tablet.  Continue Norvasc  10 mg daily. - valsartan  (DIOVAN ) 80 MG tablet; Take 1 tablet (80 mg total) by mouth daily.  Dispense: 90 tablet; Refill: 1  5. Hyperlipidemia associated with type 2 diabetes mellitus (HCC) Continue atorvastatin .  6. Tobacco dependence Strongly advised to quit.  She is wanting to try nicotine  patches but not covered by her insurance.  Given information to call 1 800 quit NOW.  7. Primary osteoarthritis of right knee Stressed the importance of weight loss to help take mechanical strain of the joint.  She is on hydrocodone through pain management with [facility] medical.  Referral submitted to orthopedics per her request. - AMB referral to orthopedics  8. Herpes zoster vaccination declined Recommended.  Patient declined.     Patient was given the opportunity to ask questions.  Patient verbalized understanding of the plan and was able to repeat key elements of the plan.   This documentation was completed using Paediatric nurse.  Any transcriptional errors are unintentional.  Orders Placed This Encounter  Procedures   AMB referral to orthopedics   POCT glycosylated hemoglobin (Hb A1C)   POCT glucose (manual entry)     Requested Prescriptions   Signed Prescriptions Disp Refills   valsartan  (DIOVAN ) 80 MG tablet 90 tablet 1    Sig: Take 1 tablet (80 mg total) by mouth daily.   Blood Glucose Monitoring Suppl (ACCU-CHEK GUIDE) w/Device KIT 1 kit 0    Sig: Use to check blood sugar once daily. Dx: E11.69   Semaglutide,0.25 or 0.5MG /DOS, 2 MG/3ML SOPN 3 mL 3    Sig: Inject 0.25 mg into the skin once a week.    Return in about 2 months (around 07/05/2024) for f/u on obesity/ozempic.  Concetta Dee,  MD, FACP

## 2024-05-06 ENCOUNTER — Encounter: Payer: Self-pay | Admitting: Pharmacist

## 2024-05-06 ENCOUNTER — Other Ambulatory Visit (HOSPITAL_BASED_OUTPATIENT_CLINIC_OR_DEPARTMENT_OTHER): Admitting: Pharmacist

## 2024-05-06 DIAGNOSIS — I10 Essential (primary) hypertension: Secondary | ICD-10-CM

## 2024-05-06 NOTE — Progress Notes (Signed)
 Patient appeared on insurance report for not passing the quality metrics in 2024: Medication Adherence for Hypertension [facility])   Outreach to the patient was Successful. Patient has filled valsartan  at the new dose of 80mg  daily on 05/05/2024 for a 90-ds. Reminder set to contact patient for refills.   Marene Shape, PharmD, Becky Bowels, CPP Clinical Pharmacist [facility] & [facility] [phone]

## 2024-05-19 DIAGNOSIS — Z9181 History of falling: Secondary | ICD-10-CM | POA: Diagnosis not present

## 2024-05-19 DIAGNOSIS — Z79899 Other long term (current) drug therapy: Secondary | ICD-10-CM | POA: Diagnosis not present

## 2024-05-19 DIAGNOSIS — E1169 Type 2 diabetes mellitus with other specified complication: Secondary | ICD-10-CM | POA: Diagnosis not present

## 2024-05-19 DIAGNOSIS — M47816 Spondylosis without myelopathy or radiculopathy, lumbar region: Secondary | ICD-10-CM | POA: Diagnosis not present

## 2024-05-19 DIAGNOSIS — F17210 Nicotine dependence, cigarettes, uncomplicated: Secondary | ICD-10-CM | POA: Diagnosis not present

## 2024-05-19 DIAGNOSIS — M1711 Unilateral primary osteoarthritis, right knee: Secondary | ICD-10-CM | POA: Diagnosis not present

## 2024-05-19 DIAGNOSIS — G629 Polyneuropathy, unspecified: Secondary | ICD-10-CM | POA: Diagnosis not present

## 2024-05-21 DIAGNOSIS — Z79899 Other long term (current) drug therapy: Secondary | ICD-10-CM | POA: Diagnosis not present

## 2024-05-24 ENCOUNTER — Other Ambulatory Visit: Payer: Self-pay | Admitting: Physician Assistant

## 2024-05-24 DIAGNOSIS — E1169 Type 2 diabetes mellitus with other specified complication: Secondary | ICD-10-CM

## 2024-05-30 ENCOUNTER — Other Ambulatory Visit: Payer: Self-pay | Admitting: Internal Medicine

## 2024-05-30 DIAGNOSIS — I10 Essential (primary) hypertension: Secondary | ICD-10-CM

## 2024-06-01 NOTE — Telephone Encounter (Signed)
 Too soon for refill.  Requested Prescriptions  Pending Prescriptions Disp Refills   amLODipine  (NORVASC ) 10 MG tablet [Pharmacy Med Name: amLODIPine  Besylate 10 MG Oral Tablet] 100 tablet 2    Sig: TAKE 1 TABLET BY MOUTH DAILY     Cardiovascular: Calcium  Channel Blockers 2 Passed - 06/01/2024  3:41 PM      Passed - Last BP in normal range    BP Readings from Last 1 Encounters:  05/05/24 137/79         Passed - Last Heart Rate in normal range    Pulse Readings from Last 1 Encounters:  05/05/24 (!) 59         Passed - Valid encounter within last 6 months    Recent Outpatient Visits           3 weeks ago Type 2 diabetes mellitus with morbid obesity (HCC)   [facility] - A Dept Of [place]. [facility] Concetta Dee B, MD   4 months ago Type 2 diabetes mellitus with morbid obesity [facility])   [facility] - A Dept Of [place]. [facility] Lawrance Presume, MD   7 months ago Toe pain, left   [facility] - A Dept Of [place]. [facility] Concetta Dee B, MD   9 months ago Type 2 diabetes mellitus with obesity [facility])   [facility] - A Dept Of [place]. [facility] Concetta Dee B, MD   12 months ago Type 2 diabetes mellitus with obesity [facility])   [facility] - A Dept Of [place]. [facility], [place], PA-C

## 2024-06-16 ENCOUNTER — Other Ambulatory Visit: Payer: Self-pay | Admitting: Internal Medicine

## 2024-06-16 DIAGNOSIS — M1711 Unilateral primary osteoarthritis, right knee: Secondary | ICD-10-CM | POA: Diagnosis not present

## 2024-06-16 DIAGNOSIS — M545 Low back pain, unspecified: Secondary | ICD-10-CM

## 2024-06-16 DIAGNOSIS — M25562 Pain in left knee: Secondary | ICD-10-CM | POA: Diagnosis not present

## 2024-06-18 ENCOUNTER — Other Ambulatory Visit: Payer: Self-pay | Admitting: Internal Medicine

## 2024-06-18 DIAGNOSIS — I10 Essential (primary) hypertension: Secondary | ICD-10-CM

## 2024-06-19 DIAGNOSIS — M47816 Spondylosis without myelopathy or radiculopathy, lumbar region: Secondary | ICD-10-CM | POA: Diagnosis not present

## 2024-06-19 DIAGNOSIS — E1169 Type 2 diabetes mellitus with other specified complication: Secondary | ICD-10-CM | POA: Diagnosis not present

## 2024-06-19 DIAGNOSIS — Z9181 History of falling: Secondary | ICD-10-CM | POA: Diagnosis not present

## 2024-06-19 DIAGNOSIS — G629 Polyneuropathy, unspecified: Secondary | ICD-10-CM | POA: Diagnosis not present

## 2024-06-19 DIAGNOSIS — M1711 Unilateral primary osteoarthritis, right knee: Secondary | ICD-10-CM | POA: Diagnosis not present

## 2024-06-19 DIAGNOSIS — Z79899 Other long term (current) drug therapy: Secondary | ICD-10-CM | POA: Diagnosis not present

## 2024-06-19 DIAGNOSIS — F17210 Nicotine dependence, cigarettes, uncomplicated: Secondary | ICD-10-CM | POA: Diagnosis not present

## 2024-06-25 DIAGNOSIS — Z79899 Other long term (current) drug therapy: Secondary | ICD-10-CM | POA: Diagnosis not present

## 2024-07-07 ENCOUNTER — Ambulatory Visit: Attending: Internal Medicine | Admitting: Internal Medicine

## 2024-07-07 ENCOUNTER — Encounter: Payer: Self-pay | Admitting: Internal Medicine

## 2024-07-07 DIAGNOSIS — F17200 Nicotine dependence, unspecified, uncomplicated: Secondary | ICD-10-CM

## 2024-07-07 DIAGNOSIS — Z6841 Body Mass Index (BMI) 40.0 and over, adult: Secondary | ICD-10-CM | POA: Diagnosis not present

## 2024-07-07 DIAGNOSIS — E1169 Type 2 diabetes mellitus with other specified complication: Secondary | ICD-10-CM | POA: Diagnosis not present

## 2024-07-07 DIAGNOSIS — Z7985 Long-term (current) use of injectable non-insulin antidiabetic drugs: Secondary | ICD-10-CM

## 2024-07-07 DIAGNOSIS — F17210 Nicotine dependence, cigarettes, uncomplicated: Secondary | ICD-10-CM | POA: Diagnosis not present

## 2024-07-07 NOTE — Progress Notes (Deleted)
 Patient ID: Karen Tanner, female    DOB: 1971/04/29  MRN: [id number]  CC: Obesity (Ozempic  f/u./Reports not taking Ozempic  at all due to possible side effects - pt concerned about injecting herself, smoking, & fried foods/)   Subjective: Karen Tanner is a 53 y.o. female who presents for chronic ds management. Her concerns today include:  Patient with history of HTN, diabetes 2 with peripheral neuropathy, HL, tob dep, anemia, chronic LBP had fusion surgery 2004, OA RT knee, history of papilloma LT breast 02/2022 (does not need chemoprophylaxis per onc Dr. Loretha), fibroid   Discussed the use of AI scribe software for clinical note transcription with the patient, who gave verbal consent to proceed.  History of Present Illness Karen Tanner is a 53 year old female with diabetes who presents for follow-up on Ozempic  treatment.  She is concerned about self-injecting Ozempic  and has brought the medication to learn proper administration. She is cautious about dietary restrictions and smoking while on Ozempic . Reports being told by friends that she would not be able to smoke or eat fried foods while on Ozempic . She does not consume sweets frequently and prefers baked foods over fried, except for fried fish. She smokes and is open to cutting back.  A recent cortisone injection in her knee has improved her ability to walk and reduced foot problems. She had all her teeth extracted and is scheduled to receive dentures tomorrow.    Patient Active Problem List   Diagnosis Date Noted   Intraductal papilloma 12/20/2021   Drinking binge 04/17/2021   Tobacco dependence 04/17/2021   Hyperlipidemia associated with type 2 diabetes mellitus (HCC) 04/17/2021   Uterine leiomyoma 08/21/2018   Type 2 diabetes mellitus with obesity (HCC) 05/21/2016   History of back surgery 10/26/2014   History of anemia 10/26/2014   Essential hypertension 10/26/2014   Chronic low back pain 10/26/2014   Menorrhagia  with regular cycle 10/26/2014     Current Outpatient Medications on File Prior to Visit  Medication Sig Dispense Refill   Accu-Chek Softclix Lancets lancets Use to check blood sugar once daily. Dx: E11.69 100 each 2   amLODipine  (NORVASC ) 10 MG tablet TAKE 1 TABLET BY MOUTH DAILY 100 tablet 2   atorvastatin  (LIPITOR) 20 MG tablet TAKE 1 TABLET BY MOUTH EVERY DAY 90 tablet 2   Blood Glucose Monitoring Suppl (ACCU-CHEK GUIDE) w/Device KIT Use to check blood sugar once daily. Dx: E11.69 1 kit 0   Blood Pressure Monitor KIT Use to check blood pressure daily 1 kit 0   Continuous Glucose Receiver (FREESTYLE LIBRE 3 READER) DEVI UAD to check blood sugars 1 each 0   Continuous Glucose Sensor (FREESTYLE LIBRE 3 PLUS SENSOR) MISC Change sensor every 15 days. 2 each 11   gabapentin  (NEURONTIN ) 300 MG capsule TAKE 2 CAPSULES BY MOUTH 3 TIMES DAILY 360 capsule 5   glucose blood (ACCU-CHEK GUIDE) test strip Use to check blood sugar once daily. Dx: E11.69 100 each 12   HYDROcodone-acetaminophen  (NORCO) 10-325 MG tablet Take 1 tablet by mouth 3 (three) times daily as needed.     lidocaine  (XYLOCAINE ) 5 % ointment Apply 1 Application topically as needed. 50 g 6   meloxicam  (MOBIC ) 15 MG tablet TAKE 1 TABLET BY MOUTH DAILY 100 tablet 0   methocarbamol  (ROBAXIN ) 500 MG tablet TAKE 1 TABLET (500 MG TOTAL) BY MOUTH 2 (TWO) TIMES DAILY AS NEEDED FOR MUSCLE SPASMS. 60 tablet 0   Misc. Devices MISC Blood pressure device  1 Device 0   Semaglutide ,0.25 or 0.5MG /DOS, 2 MG/3ML SOPN Inject 0.25 mg into the skin once a week. 3 mL 3   valsartan  (DIOVAN ) 80 MG tablet Take 1 tablet (80 mg total) by mouth daily. 90 tablet 1   No current facility-administered medications on file prior to visit.    No Known Allergies  Social History   Socioeconomic History   Marital status: Single    Spouse name: Not on file   Number of children: Not on file   Years of education: Not on file   Highest education level: GED or equivalent   Occupational History   Not on file  Tobacco Use   Smoking status: Every Day    Current packs/day: 0.25    Average packs/day: 0.3 packs/day for 14.0 years (3.5 ttl pk-yrs)    Types: Cigarettes   Smokeless tobacco: Never   Tobacco comments:    Smoking 6-7 cigs per day  Vaping Use   Vaping status: Never Used  Substance and Sexual Activity   Alcohol use: Yes    Comment: 5-6 drinks per day on weekends   Drug use: Yes    Types: Marijuana   Sexual activity: Not on file  Other Topics Concern   Not on file  Social History Narrative   Not on file   Social Drivers of Health   Financial Resource Strain: Medium Risk (01/05/2024)   Overall Financial Resource Strain (CARDIA)    Difficulty of Paying Living Expenses: Somewhat hard  Food Insecurity: Food Insecurity Present (01/05/2024)   Hunger Vital Sign    Worried About Running Out of Food in the Last Year: Sometimes true    Ran Out of Food in the Last Year: Sometimes true  Transportation Needs: No Transportation Needs (01/05/2024)   PRAPARE - Administrator, Civil Service (Medical): No    Lack of Transportation (Non-Medical): No  Physical Activity: Inactive (01/05/2024)   Exercise Vital Sign    Days of Exercise per Week: 0 days    Minutes of Exercise per Session: 0 min  Stress: No Stress Concern Present (01/05/2024)   [employer] of Occupational Health - Occupational Stress Questionnaire    Feeling of Stress : Not at all  Social Connections: Moderately Isolated (01/05/2024)   Social Connection and Isolation Panel    Frequency of Communication with Friends and Family: Three times a week    Frequency of Social Gatherings with Friends and Family: Twice a week    Attends Religious Services: More than 4 times per year    Active Member of [employer] or Organizations: No    Attends Banker Meetings: Never    Marital Status: Never married  Intimate Partner Violence: Not At Risk (10/29/2023)   Humiliation, Afraid, Rape, and  Kick questionnaire    Fear of Current or Ex-Partner: No    Emotionally Abused: No    Physically Abused: No    Sexually Abused: No    Family History  Problem Relation Age of Onset   Diabetes Mother    Hyperlipidemia Mother    Heart disease Mother    Diabetes Father    Diabetes Maternal Grandmother    BRCA 1/2 Neg Hx    Breast cancer Neg Hx     Past Surgical History:  Procedure Laterality Date   BACK SURGERY     Bone fusion   BREAST LUMPECTOMY Left 03/09/2022   intraductal papilloma with calcifications   BREAST LUMPECTOMY WITH RADIOACTIVE SEED LOCALIZATION Left 03/09/2022  Procedure: LEFT BREAST LUMPECTOMY WITH RADIOACTIVE SEED LOCALIZATION;  Surgeon: Curvin Deward MOULD, MD;  Location: [facility];  Service: General;  Laterality: Left;    ROS: Review of Systems Negative except as stated above  PHYSICAL EXAM: BP 127/78 (BP Location: Left Arm, Patient Position: Sitting, Cuff Size: Large)   Pulse 63   Temp 98.1 F (36.7 C) (Oral)   Ht 5' 7 (1.702 m)   Wt 264 lb (119.7 kg)   LMP  (LMP Unknown) Comment: Patient states last period was over a year ago  SpO2 98%   BMI 41.35 kg/m   Wt Readings from Last 3 Encounters:  07/07/24 264 lb (119.7 kg)  05/05/24 263 lb (119.3 kg)  01/06/24 249 lb (112.9 kg)    Physical Exam  General appearance - alert, well appearing, and in no distress Mental status - alert, oriented to person, place, and time      Latest Ref Rng & Units 06/05/2023    3:00 PM 09/18/2022    4:53 PM 09/07/2022    1:37 AM  CMP  Glucose 70 - 99 mg/dL 886  78  818   BUN 6 - 24 mg/dL 20  11  13    Creatinine 0.57 - 1.00 mg/dL 9.07  9.41  8.95   Sodium 134 - 144 mmol/L 140  139  142   Potassium 3.5 - 5.2 mmol/L 4.7  3.4  4.1   Chloride 96 - 106 mmol/L 101  102  101   CO2 20 - 29 mmol/L 26  28  25    Calcium  8.7 - 10.2 mg/dL 9.8  9.6  9.7   Total Protein 6.0 - 8.5 g/dL 7.4  7.4  8.1   Total Bilirubin 0.0 - 1.2 mg/dL 0.4  0.5  0.2   Alkaline Phos 44  - 121 IU/L 93  61  74   AST 0 - 40 IU/L 32  23  25   ALT 0 - 32 IU/L 25  20  21     Lipid Panel     Component Value Date/Time   CHOL 154 09/05/2023 1523   TRIG 133 09/05/2023 1523   HDL 56 09/05/2023 1523   CHOLHDL 2.8 09/05/2023 1523   CHOLHDL 3.2 01/23/2016 0956   VLDL 9 01/23/2016 0956   LDLCALC 75 09/05/2023 1523    CBC    Component Value Date/Time   WBC 6.3 09/05/2023 1523   WBC 6.8 09/18/2022 1653   RBC 4.73 09/05/2023 1523   RBC 4.82 09/18/2022 1653   HGB 13.2 09/05/2023 1523   HCT 39.7 09/05/2023 1523   PLT 257 09/05/2023 1523   MCV 84 09/05/2023 1523   MCH 27.9 09/05/2023 1523   MCH 27.8 09/18/2022 1653   MCHC 33.2 09/05/2023 1523   MCHC 32.6 09/18/2022 1653   RDW 13.5 09/05/2023 1523   LYMPHSABS 1.3 09/07/2022 0137   MONOABS 0.8 09/07/2022 0137   EOSABS 0.0 09/07/2022 0137   BASOSABS 0.1 09/07/2022 0137    ASSESSMENT AND PLAN: 1. Type 2 diabetes mellitus with morbid obesity (HCC) (Primary) As patient that Ozempic  does not have any adverse interaction with cigarette smoking though I encouraged her to discontinue smoking for her overall health.  There is also no contraindication to her eating certain foods while on Ozempic .  However I told her that the Ozempic  will decrease her appetite.  I went over with pt again how the medication works and potential side effects including nausea, vomiting, diarrhea/constipation, bowel blockage, palpitations and pancreatitis.  Advised to stop the medicine and be seen if pt develops any abdominal pain, vomiting, severe diarrhea/constipation or palpitations. - Clinical pharmacist met with her today to show her how to give herself the 0.25 mg dose which she will do once a week for the next 4 weeks.  Once she has been on this dose for 1 month and tolerating it well, she should let me know so that we can increase it to the 0.5 mg dose. Stop Amaryl  once she starts the Ozempic  - Now that her knee is feeling better, I also encouraged her  to start walking if only 15 minutes 3 days a week.   2. Long-term (current) use of injectable non-insulin antidiabetic drugs See #1 above.  3. Tobacco dependence Strongly advised to quit smoking.   Patient was given the opportunity to ask questions.  Patient verbalized understanding of the plan and was able to repeat key elements of the plan.   This documentation was completed using Paediatric nurse.  Any transcriptional errors are unintentional.  No orders of the defined types were placed in this encounter.    Requested Prescriptions    No prescriptions requested or ordered in this encounter    Return in about 2 months (around 09/07/2024).  Barnie Louder, MD, FACP

## 2024-07-07 NOTE — Progress Notes (Signed)
 Patient ID: Karen Tanner, female    DOB: 1971/04/29  MRN: [id number]  CC: Obesity (Ozempic  f/u./Reports not taking Ozempic  at all due to possible side effects - pt concerned about injecting herself, smoking, & fried foods/)   Subjective: Karen Tanner is a 53 y.o. female who presents for chronic ds management. Her concerns today include:  Patient with history of HTN, diabetes 2 with peripheral neuropathy, HL, tob dep, anemia, chronic LBP had fusion surgery 2004, OA RT knee, history of papilloma LT breast 02/2022 (does not need chemoprophylaxis per onc Dr. Loretha), fibroid   Discussed the use of AI scribe software for clinical note transcription with the patient, who gave verbal consent to proceed.  History of Present Illness Karen Tanner is a 53 year old female with diabetes who presents for follow-up on Ozempic  treatment.  She is concerned about self-injecting Ozempic  and has brought the medication to learn proper administration. She is cautious about dietary restrictions and smoking while on Ozempic . Reports being told by friends that she would not be able to smoke or eat fried foods while on Ozempic . She does not consume sweets frequently and prefers baked foods over fried, except for fried fish. She smokes and is open to cutting back.  A recent cortisone injection in her knee has improved her ability to walk and reduced foot problems. She had all her teeth extracted and is scheduled to receive dentures tomorrow.    Patient Active Problem List   Diagnosis Date Noted   Intraductal papilloma 12/20/2021   Drinking binge 04/17/2021   Tobacco dependence 04/17/2021   Hyperlipidemia associated with type 2 diabetes mellitus (HCC) 04/17/2021   Uterine leiomyoma 08/21/2018   Type 2 diabetes mellitus with obesity (HCC) 05/21/2016   History of back surgery 10/26/2014   History of anemia 10/26/2014   Essential hypertension 10/26/2014   Chronic low back pain 10/26/2014   Menorrhagia  with regular cycle 10/26/2014     Current Outpatient Medications on File Prior to Visit  Medication Sig Dispense Refill   Accu-Chek Softclix Lancets lancets Use to check blood sugar once daily. Dx: E11.69 100 each 2   amLODipine  (NORVASC ) 10 MG tablet TAKE 1 TABLET BY MOUTH DAILY 100 tablet 2   atorvastatin  (LIPITOR) 20 MG tablet TAKE 1 TABLET BY MOUTH EVERY DAY 90 tablet 2   Blood Glucose Monitoring Suppl (ACCU-CHEK GUIDE) w/Device KIT Use to check blood sugar once daily. Dx: E11.69 1 kit 0   Blood Pressure Monitor KIT Use to check blood pressure daily 1 kit 0   Continuous Glucose Receiver (FREESTYLE LIBRE 3 READER) DEVI UAD to check blood sugars 1 each 0   Continuous Glucose Sensor (FREESTYLE LIBRE 3 PLUS SENSOR) MISC Change sensor every 15 days. 2 each 11   gabapentin  (NEURONTIN ) 300 MG capsule TAKE 2 CAPSULES BY MOUTH 3 TIMES DAILY 360 capsule 5   glucose blood (ACCU-CHEK GUIDE) test strip Use to check blood sugar once daily. Dx: E11.69 100 each 12   HYDROcodone-acetaminophen  (NORCO) 10-325 MG tablet Take 1 tablet by mouth 3 (three) times daily as needed.     lidocaine  (XYLOCAINE ) 5 % ointment Apply 1 Application topically as needed. 50 g 6   meloxicam  (MOBIC ) 15 MG tablet TAKE 1 TABLET BY MOUTH DAILY 100 tablet 0   methocarbamol  (ROBAXIN ) 500 MG tablet TAKE 1 TABLET (500 MG TOTAL) BY MOUTH 2 (TWO) TIMES DAILY AS NEEDED FOR MUSCLE SPASMS. 60 tablet 0   Misc. Devices MISC Blood pressure device  1 Device 0   Semaglutide ,0.25 or 0.5MG /DOS, 2 MG/3ML SOPN Inject 0.25 mg into the skin once a week. 3 mL 3   valsartan  (DIOVAN ) 80 MG tablet Take 1 tablet (80 mg total) by mouth daily. 90 tablet 1   No current facility-administered medications on file prior to visit.    No Known Allergies  Social History   Socioeconomic History   Marital status: Single    Spouse name: Not on file   Number of children: Not on file   Years of education: Not on file   Highest education level: GED or equivalent   Occupational History   Not on file  Tobacco Use   Smoking status: Every Day    Current packs/day: 0.25    Average packs/day: 0.3 packs/day for 14.0 years (3.5 ttl pk-yrs)    Types: Cigarettes   Smokeless tobacco: Never   Tobacco comments:    Smoking 6-7 cigs per day  Vaping Use   Vaping status: Never Used  Substance and Sexual Activity   Alcohol use: Yes    Comment: 5-6 drinks per day on weekends   Drug use: Yes    Types: Marijuana   Sexual activity: Not on file  Other Topics Concern   Not on file  Social History Narrative   Not on file   Social Drivers of Health   Financial Resource Strain: Medium Risk (01/05/2024)   Overall Financial Resource Strain (CARDIA)    Difficulty of Paying Living Expenses: Somewhat hard  Food Insecurity: Food Insecurity Present (01/05/2024)   Hunger Vital Sign    Worried About Running Out of Food in the Last Year: Sometimes true    Ran Out of Food in the Last Year: Sometimes true  Transportation Needs: No Transportation Needs (01/05/2024)   PRAPARE - Administrator, Civil Service (Medical): No    Lack of Transportation (Non-Medical): No  Physical Activity: Inactive (01/05/2024)   Exercise Vital Sign    Days of Exercise per Week: 0 days    Minutes of Exercise per Session: 0 min  Stress: No Stress Concern Present (01/05/2024)   [employer] of Occupational Health - Occupational Stress Questionnaire    Feeling of Stress : Not at all  Social Connections: Moderately Isolated (01/05/2024)   Social Connection and Isolation Panel    Frequency of Communication with Friends and Family: Three times a week    Frequency of Social Gatherings with Friends and Family: Twice a week    Attends Religious Services: More than 4 times per year    Active Member of [employer] or Organizations: No    Attends Banker Meetings: Never    Marital Status: Never married  Intimate Partner Violence: Not At Risk (10/29/2023)   Humiliation, Afraid, Rape, and  Kick questionnaire    Fear of Current or Ex-Partner: No    Emotionally Abused: No    Physically Abused: No    Sexually Abused: No    Family History  Problem Relation Age of Onset   Diabetes Mother    Hyperlipidemia Mother    Heart disease Mother    Diabetes Father    Diabetes Maternal Grandmother    BRCA 1/2 Neg Hx    Breast cancer Neg Hx     Past Surgical History:  Procedure Laterality Date   BACK SURGERY     Bone fusion   BREAST LUMPECTOMY Left 03/09/2022   intraductal papilloma with calcifications   BREAST LUMPECTOMY WITH RADIOACTIVE SEED LOCALIZATION Left 03/09/2022  Procedure: LEFT BREAST LUMPECTOMY WITH RADIOACTIVE SEED LOCALIZATION;  Surgeon: Curvin Deward MOULD, MD;  Location: [facility];  Service: General;  Laterality: Left;    ROS: Review of Systems Negative except as stated above  PHYSICAL EXAM: BP 127/78 (BP Location: Left Arm, Patient Position: Sitting, Cuff Size: Large)   Pulse 63   Temp 98.1 F (36.7 C) (Oral)   Ht 5' 7 (1.702 m)   Wt 264 lb (119.7 kg)   LMP  (LMP Unknown) Comment: Patient states last period was over a year ago  SpO2 98%   BMI 41.35 kg/m   Wt Readings from Last 3 Encounters:  07/07/24 264 lb (119.7 kg)  05/05/24 263 lb (119.3 kg)  01/06/24 249 lb (112.9 kg)    Physical Exam  General appearance - alert, well appearing, and in no distress Mental status - alert, oriented to person, place, and time      Latest Ref Rng & Units 06/05/2023    3:00 PM 09/18/2022    4:53 PM 09/07/2022    1:37 AM  CMP  Glucose 70 - 99 mg/dL 886  78  818   BUN 6 - 24 mg/dL 20  11  13    Creatinine 0.57 - 1.00 mg/dL 9.07  9.41  8.95   Sodium 134 - 144 mmol/L 140  139  142   Potassium 3.5 - 5.2 mmol/L 4.7  3.4  4.1   Chloride 96 - 106 mmol/L 101  102  101   CO2 20 - 29 mmol/L 26  28  25    Calcium  8.7 - 10.2 mg/dL 9.8  9.6  9.7   Total Protein 6.0 - 8.5 g/dL 7.4  7.4  8.1   Total Bilirubin 0.0 - 1.2 mg/dL 0.4  0.5  0.2   Alkaline Phos 44  - 121 IU/L 93  61  74   AST 0 - 40 IU/L 32  23  25   ALT 0 - 32 IU/L 25  20  21     Lipid Panel     Component Value Date/Time   CHOL 154 09/05/2023 1523   TRIG 133 09/05/2023 1523   HDL 56 09/05/2023 1523   CHOLHDL 2.8 09/05/2023 1523   CHOLHDL 3.2 01/23/2016 0956   VLDL 9 01/23/2016 0956   LDLCALC 75 09/05/2023 1523    CBC    Component Value Date/Time   WBC 6.3 09/05/2023 1523   WBC 6.8 09/18/2022 1653   RBC 4.73 09/05/2023 1523   RBC 4.82 09/18/2022 1653   HGB 13.2 09/05/2023 1523   HCT 39.7 09/05/2023 1523   PLT 257 09/05/2023 1523   MCV 84 09/05/2023 1523   MCH 27.9 09/05/2023 1523   MCH 27.8 09/18/2022 1653   MCHC 33.2 09/05/2023 1523   MCHC 32.6 09/18/2022 1653   RDW 13.5 09/05/2023 1523   LYMPHSABS 1.3 09/07/2022 0137   MONOABS 0.8 09/07/2022 0137   EOSABS 0.0 09/07/2022 0137   BASOSABS 0.1 09/07/2022 0137    ASSESSMENT AND PLAN: 1. Type 2 diabetes mellitus with morbid obesity (HCC) (Primary) As patient that Ozempic  does not have any adverse interaction with cigarette smoking though I encouraged her to discontinue smoking for her overall health.  There is also no contraindication to her eating certain foods while on Ozempic .  However I told her that the Ozempic  will decrease her appetite.  I went over with pt again how the medication works and potential side effects including nausea, vomiting, diarrhea/constipation, bowel blockage, palpitations and pancreatitis.  Advised to stop the medicine and be seen if pt develops any abdominal pain, vomiting, severe diarrhea/constipation or palpitations. - Clinical pharmacist met with her today to show her how to give herself the 0.25 mg dose which she will do once a week for the next 4 weeks.  Once she has been on this dose for 1 month and tolerating it well, she should let me know so that we can increase it to the 0.5 mg dose. Stop Amaryl  once she starts the Ozempic  - Now that her knee is feeling better, I also encouraged her  to start walking if only 15 minutes 3 days a week.   2. Long-term (current) use of injectable non-insulin antidiabetic drugs See #1 above.  3. Tobacco dependence Strongly advised to quit smoking.   Patient was given the opportunity to ask questions.  Patient verbalized understanding of the plan and was able to repeat key elements of the plan.   This documentation was completed using Paediatric nurse.  Any transcriptional errors are unintentional.  No orders of the defined types were placed in this encounter.    Requested Prescriptions    No prescriptions requested or ordered in this encounter    Return in about 2 months (around 09/07/2024).  Barnie Louder, MD, FACP

## 2024-07-07 NOTE — Patient Instructions (Signed)
 VISIT SUMMARY:  Today, we discussed your ongoing management of Type 2 Diabetes Mellitus, including starting a new medication, Ozempic . We also reviewed your recent cortisone injection for knee osteoarthritis and your upcoming dental procedure for dentures.  YOUR PLAN:  -TYPE 2 DIABETES MELLITUS: Type 2 Diabetes Mellitus is a condition where your body does not use insulin properly, leading to high blood sugar levels. You will start taking Ozempic  at a dose of 0.25 mg once a week for 4 weeks to help control your blood sugar and assist with weight loss. You should stop taking glimepiride  when you start Ozempic . A clinical pharmacist will show you how to inject Ozempic . Please call us  after 4 weeks to discuss how you are tolerating the medication and to adjust the dose if needed. Additionally, it is important to work on quitting smoking as it can affect your diabetes management.   INSTRUCTIONS:  Please call us  after 4 weeks to discuss your tolerance to Ozempic  and to adjust the dose if needed. Continue to work on quitting smoking. Follow up with your dentist as scheduled for your dentures.

## 2024-07-09 ENCOUNTER — Institutional Professional Consult (permissible substitution): Admitting: Plastic Surgery

## 2024-07-14 ENCOUNTER — Ambulatory Visit: Payer: 59 | Attending: Internal Medicine

## 2024-07-14 VITALS — Ht 67.0 in | Wt 260.0 lb

## 2024-07-14 DIAGNOSIS — Z0000 Encounter for general adult medical examination without abnormal findings: Secondary | ICD-10-CM

## 2024-07-14 NOTE — Patient Instructions (Signed)
 Karen Tanner , Thank you for taking time out of your busy schedule to complete your Annual Wellness Visit with me. I enjoyed our conversation and look forward to speaking with you again next year. I, as well as your care team,  appreciate your ongoing commitment to your health goals. Please review the following plan we discussed and let me know if I can assist you in the future. Your Game plan/ To Do List    Referrals: If you haven't heard from the office you've been referred to, please reach out to them at the phone provided.  [facility] (appt scheduled for 11/2024) Follow up Visits: Next Medicare AWV with our clinical staff: 07/20/2025 at 10:30 a.m. phone visit with Nurse    Have you seen your provider in the last 6 months (3 months if uncontrolled diabetes)? Yes Next Office Visit with your provider: 09/27/2024 at 2:50 p.m. office visit with Dr. Vicci  Clinician Recommendations:  Aim for 30 minutes of exercise or brisk walking, 6-8 glasses of water, and 5 servings of fruits and vegetables each day.       This is a list of the screening recommended for you and due dates:  Health Maintenance  Topic Date Due   COVID-19 Vaccine (1) Never done   Hepatitis C Screening  Never done   Hepatitis B Vaccine (1 of 3 - 19+ 3-dose series) Never done   Eye exam for diabetics  08/03/2022   Yearly kidney function blood test for diabetes  06/18/2024   Zoster (Shingles) Vaccine (1 of 2) 08/05/2024*   Colon Cancer Screening  03/20/2025*   Flu Shot  07/31/2024   Yearly kidney health urinalysis for diabetes  09/04/2024   Hemoglobin A1C  11/05/2024   Complete foot exam   01/05/2025   Stool Blood Test  03/20/2025   Medicare Annual Wellness Visit  07/14/2025   Mammogram  01/14/2026   DTaP/Tdap/Td vaccine (2 - Td or Tdap) 01/17/2026   Pap with HPV screening  12/05/2026   Pneumococcal Vaccination  Completed   HIV Screening  Completed   HPV Vaccine  Aged Out   Meningitis B Vaccine  Aged Out  *Topic was  postponed. The date shown is not the original due date.    Advanced directives: (Declined) Advance directive discussed with you today. Even though you declined this today, please call our office should you change your mind, and we can give you the proper paperwork for you to fill out. Advance Care Planning is important because it:  [x]  Makes sure you receive the medical care that is consistent with your values, goals, and preferences  [x]  It provides guidance to your family and loved ones and reduces their decisional burden about whether or not they are making the right decisions based on your wishes.  Follow the link provided in your after visit summary or read over the paperwork we have mailed to you to help you started getting your Advance Directives in place. If you need assistance in completing these, please reach out to us  so that we can help you!  See attachments for Preventive Care and Fall Prevention Tips.

## 2024-07-14 NOTE — Progress Notes (Signed)
 Because this visit was a virtual/telehealth visit,  certain criteria was not obtained, such a blood pressure, CBG if applicable, and timed get up and go. Any medications not marked as taking were not mentioned during the medication reconciliation part of the visit. Any vitals not documented were not able to be obtained due to this being a telehealth visit or patient was unable to self-report a recent blood pressure reading due to a lack of equipment at home via telehealth. Vitals that have been documented are verbally provided by the patient.   Subjective:   Karen Tanner is a 53 y.o. who presents for a Medicare Wellness preventive visit.  As a reminder, Annual Wellness Visits don't include a physical exam, and some assessments may be limited, especially if this visit is performed virtually. We may recommend an in-person follow-up visit with your provider if needed.  Visit Complete: Virtual I connected with  Karen Tanner on 07/14/24 by a audio enabled telemedicine application and verified that I am speaking with the correct person using two identifiers.  Patient Location: Home  Provider Location: Office/Clinic  I discussed the limitations of evaluation and management by telemedicine. The patient expressed understanding and agreed to proceed.  Vital Signs: Because this visit was a virtual/telehealth visit, some criteria may be missing or patient reported. Any vitals not documented were not able to be obtained and vitals that have been documented are patient reported.  VideoDeclined- This patient declined Librarian, academic. Therefore the visit was completed with audio only.  Persons Participating in Visit: Patient.  AWV Questionnaire: No: Patient Medicare AWV questionnaire was not completed prior to this visit.  Cardiac Risk Factors include: advanced age (>45men, >76 women);sedentary lifestyle;diabetes mellitus;dyslipidemia;hypertension;obesity (BMI  >30kg/m2);smoking/ tobacco exposure     Objective:    Today's Vitals   07/14/24 1034  Weight: 260 lb (117.9 kg)  Height: 5' 7 (1.702 m)  PainSc: 0-No pain   Body mass index is 40.72 kg/m.     07/14/2024   10:41 AM 07/09/2023   12:15 PM 10/19/2022   11:36 AM 10/19/2022   11:31 AM 03/09/2022    7:45 AM 10/12/2021   10:45 AM 06/13/2016   10:33 AM  Advanced Directives  Does Patient Have a Medical Advance Directive? No No No No No No No   Does patient want to make changes to medical advance directive?   Yes (MAU/Ambulatory/Procedural Areas - Information given)      Would patient like information on creating a medical advance directive? No - Patient declined Yes (MAU/Ambulatory/Procedural Areas - Information given)   No - Patient declined  No - patient declined information      Data saved with a previous flowsheet row definition    Current Medications (verified) Outpatient Encounter Medications as of 07/14/2024  Medication Sig   Accu-Chek Softclix Lancets lancets Use to check blood sugar once daily. Dx: E11.69   amLODipine  (NORVASC ) 10 MG tablet TAKE 1 TABLET BY MOUTH DAILY   atorvastatin  (LIPITOR) 20 MG tablet TAKE 1 TABLET BY MOUTH EVERY DAY   Blood Glucose Monitoring Suppl (ACCU-CHEK GUIDE) w/Device KIT Use to check blood sugar once daily. Dx: E11.69   Blood Pressure Monitor KIT Use to check blood pressure daily   Continuous Glucose Receiver (FREESTYLE LIBRE 3 READER) DEVI UAD to check blood sugars (Patient not taking: Reported on 07/14/2024)   Continuous Glucose Sensor (FREESTYLE LIBRE 3 PLUS SENSOR) MISC Change sensor every 15 days. (Patient not taking: Reported on 07/14/2024)  gabapentin  (NEURONTIN ) 300 MG capsule TAKE 2 CAPSULES BY MOUTH 3 TIMES DAILY   glucose blood (ACCU-CHEK GUIDE) test strip Use to check blood sugar once daily. Dx: E11.69   HYDROcodone-acetaminophen  (NORCO) 10-325 MG tablet Take 1 tablet by mouth 3 (three) times daily as needed.   lidocaine  (XYLOCAINE ) 5 %  ointment Apply 1 Application topically as needed.   meloxicam  (MOBIC ) 15 MG tablet TAKE 1 TABLET BY MOUTH DAILY   methocarbamol  (ROBAXIN ) 500 MG tablet TAKE 1 TABLET (500 MG TOTAL) BY MOUTH 2 (TWO) TIMES DAILY AS NEEDED FOR MUSCLE SPASMS.   Misc. Devices MISC Blood pressure device   Semaglutide ,0.25 or 0.5MG /DOS, 2 MG/3ML SOPN Inject 0.25 mg into the skin once a week.   valsartan  (DIOVAN ) 80 MG tablet Take 1 tablet (80 mg total) by mouth daily.   No facility-administered encounter medications on file as of 07/14/2024.    Allergies (verified) Patient has no known allergies.   History: Past Medical History:  Diagnosis Date   Anemia    Arthritis    knee   Diabetes mellitus without complication (HCC)    Hypertension    Hypertensive retinopathy    Past Surgical History:  Procedure Laterality Date   BACK SURGERY     Bone fusion   BREAST LUMPECTOMY Left 03/09/2022   intraductal papilloma with calcifications   BREAST LUMPECTOMY WITH RADIOACTIVE SEED LOCALIZATION Left 03/09/2022   Procedure: LEFT BREAST LUMPECTOMY WITH RADIOACTIVE SEED LOCALIZATION;  Surgeon: Curvin Deward MOULD, MD;  Location: [facility];  Service: General;  Laterality: Left;   Family History  Problem Relation Age of Onset   Diabetes Mother    Hyperlipidemia Mother    Heart disease Mother    Diabetes Father    Diabetes Maternal Grandmother    BRCA 1/2 Neg Hx    Breast cancer Neg Hx    Social History   Socioeconomic History   Marital status: Single    Spouse name: Not on file   Number of children: Not on file   Years of education: Not on file   Highest education level: GED or equivalent  Occupational History   Not on file  Tobacco Use   Smoking status: Every Day    Current packs/day: 0.25    Average packs/day: 0.3 packs/day for 14.0 years (3.5 ttl pk-yrs)    Types: Cigarettes   Smokeless tobacco: Never   Tobacco comments:    Smoking 6-7 cigs per day  Vaping Use   Vaping status: Never Used   Substance and Sexual Activity   Alcohol use: Yes    Comment: 5-6 drinks per day on weekends   Drug use: Yes    Types: Marijuana   Sexual activity: Not on file  Other Topics Concern   Not on file  Social History Narrative   Not on file   Social Drivers of Health   Financial Resource Strain: Medium Risk (07/14/2024)   Overall Financial Resource Strain (CARDIA)    Difficulty of Paying Living Expenses: Somewhat hard  Food Insecurity: Food Insecurity Present (07/14/2024)   Hunger Vital Sign    Worried About Running Out of Food in the Last Year: Sometimes true    Ran Out of Food in the Last Year: Sometimes true  Transportation Needs: Unmet Transportation Needs (07/14/2024)   PRAPARE - Transportation    Lack of Transportation (Medical): No    Lack of Transportation (Non-Medical): Yes  Physical Activity: Inactive (07/14/2024)   Exercise Vital Sign    Days of  Exercise per Week: 0 days    Minutes of Exercise per Session: 0 min  Stress: No Stress Concern Present (07/14/2024)   [employer] of Occupational Health - Occupational Stress Questionnaire    Feeling of Stress: Not at all  Social Connections: Moderately Integrated (07/14/2024)   Social Connection and Isolation Panel    Frequency of Communication with Friends and Family: More than three times a week    Frequency of Social Gatherings with Friends and Family: More than three times a week    Attends Religious Services: More than 4 times per year    Active Member of [employer] or Organizations: Yes    Attends Banker Meetings: 1 to 4 times per year    Marital Status: Never married    Tobacco Counseling Ready to quit: Not Answered Counseling given: Not Answered Tobacco comments: Smoking 6-7 cigs per day    Clinical Intake:  Pre-visit preparation completed: Yes  Pain : No/denies pain Pain Score: 0-No pain     BMI - recorded: 40.72 Nutritional Status: BMI > 30  Obese Nutritional Risks: None Diabetes: Yes CBG  done?: No Did pt. bring in CBG monitor from home?: No  Lab Results  Component Value Date   HGBA1C 6.9 05/05/2024   HGBA1C 6.9 01/06/2024   HGBA1C 6.6 09/05/2023     How often do you need to have someone help you when you read instructions, pamphlets, or other written materials from your doctor or pharmacy?: 1 - Never  Interpreter Needed?: No  Information entered by :: Devian Bartolomei N. Johni Narine, LPN.   Activities of Daily Living     07/14/2024   10:41 AM  In your present state of health, do you have any difficulty performing the following activities:  Hearing? 0  Vision? 0  Difficulty concentrating or making decisions? 0  Walking or climbing stairs? 0  Dressing or bathing? 0  Doing errands, shopping? 0  Preparing Food and eating ? N  Using the Toilet? N  In the past six months, have you accidently leaked urine? N  Do you have problems with loss of bowel control? N  Managing your Medications? N  Managing your Finances? N  Housekeeping or managing your Housekeeping? N    Patient Care Team: Vicci Barnie NOVAK, MD as PCP - General (Internal Medicine) Grossman, Janelle, NP as Nurse Practitioner (Nurse Practitioner) Faye Lauraine PARAS, FNP (Family Medicine)  I have updated your Care Teams any recent Medical Services you may have received from other providers in the past year.     Assessment:   This is a routine wellness examination for Betsaida.  Hearing/Vision screen Hearing Screening - Comments:: Denies hearing difficulties.  Vision Screening - Comments:: Wears rx glasses - not up to date with routine eye exams.    Goals Addressed             This Visit's Progress    07/14/2024: Since I have started Ozempic , my plan is to start an exercise regimen at home.         Depression Screen     07/14/2024   10:43 AM 07/07/2024    1:41 PM 05/05/2024    1:57 PM 10/29/2023    9:44 AM 09/05/2023    2:14 PM 07/09/2023   12:13 PM 06/05/2023    2:24 PM  PHQ 2/9 Scores  PHQ - 2 Score  0 0 0 0 0 0 0  PHQ- 9 Score 0 0 1 0  0 0  Fall Risk     07/14/2024   10:41 AM 07/07/2024    1:41 PM 05/05/2024    1:57 PM 09/05/2023    2:14 PM 07/09/2023   12:14 PM  Fall Risk   Falls in the past year? 0 0 0 0 0  Number falls in past yr: 0 0 0 0 0  Injury with Fall? 0 0 0 0 0  Risk for fall due to : No Fall Risks No Fall Risks No Fall Risks No Fall Risks No Fall Risks  Follow up Falls evaluation completed Falls evaluation completed Falls evaluation completed Falls evaluation completed Falls prevention discussed;Education provided;Falls evaluation completed    MEDICARE RISK AT HOME:  Medicare Risk at Home Any stairs in or around the home?: No If so, are there any without handrails?: No Home free of loose throw rugs in walkways, pet beds, electrical cords, etc?: Yes Adequate lighting in your home to reduce risk of falls?: Yes Life alert?: No Use of a cane, walker or w/c?: No Grab bars in the bathroom?: No Shower chair or bench in shower?: No Elevated toilet seat or a handicapped toilet?: No  TIMED UP AND GO:  Was the test performed?  No  Cognitive Function: Declined/Normal: No cognitive concerns noted by patient or family. Patient alert, oriented, able to answer questions appropriately and recall recent events. No signs of memory loss or confusion.    07/14/2024   10:43 AM  MMSE - Mini Mental State Exam  Not completed: Unable to complete        07/14/2024   10:39 AM 07/09/2023   12:14 PM 10/19/2022   11:47 AM  6CIT Screen  What Year? 0 points 0 points 0 points  What month? 0 points 0 points 0 points  What time? 0 points 0 points 0 points  Count back from 20 0 points 0 points 0 points  Months in reverse 0 points 0 points 0 points  Repeat phrase 0 points 0 points 0 points  Total Score 0 points 0 points 0 points    Immunizations Immunization History  Administered Date(s) Administered   Influenza, Seasonal, Injecte, Preservative Fre 09/05/2023   Influenza,inj,Quad PF,6+  Mos 10/26/2014, 09/22/2015, 10/08/2022   Influenza-Unspecified 11/28/2021   PNEUMOCOCCAL CONJUGATE-20 12/05/2021   Tdap 01/18/2016    Screening Tests Health Maintenance  Topic Date Due   COVID-19 Vaccine (1) Never done   Hepatitis C Screening  Never done   Hepatitis B Vaccines (1 of 3 - 19+ 3-dose series) Never done   OPHTHALMOLOGY EXAM  08/03/2022   Diabetic kidney evaluation - eGFR measurement  06/18/2024   Zoster Vaccines- Shingrix (1 of 2) 08/05/2024 (Originally 11/26/1990)   Colonoscopy  03/20/2025 (Originally 11/26/2014)   INFLUENZA VACCINE  07/31/2024   Diabetic kidney evaluation - Urine ACR  09/04/2024   HEMOGLOBIN A1C  11/05/2024   FOOT EXAM  01/05/2025   COLON CANCER SCREENING ANNUAL FOBT  03/20/2025   Medicare Annual Wellness (AWV)  07/14/2025   MAMMOGRAM  01/14/2026   DTaP/Tdap/Td (2 - Td or Tdap) 01/17/2026   Cervical Cancer Screening (HPV/Pap Cotest)  12/05/2026   Pneumococcal Vaccine 107-31 Years old  Completed   HIV Screening  Completed   HPV VACCINES  Aged Out   Meningococcal B Vaccine  Aged Out    Health Maintenance  Health Maintenance Due  Topic Date Due   COVID-19 Vaccine (1) Never done   Hepatitis C Screening  Never done   Hepatitis B Vaccines (1 of 3 -  19+ 3-dose series) Never done   OPHTHALMOLOGY EXAM  08/03/2022   Diabetic kidney evaluation - eGFR measurement  06/18/2024   Health Maintenance Items Addressed: Yes Patient aware of current care gaps.  Immunization record was verified by [employer].    Additional Screening:  Vision Screening: Recommended annual ophthalmology exams for early detection of glaucoma and other disorders of the eye. Would you like a referral to an eye doctor? No    Dental Screening: Recommended annual dental exams for proper oral hygiene  Community Resource Referral / Chronic Care Management: CRR required this visit?  No   CCM required this visit?  No   Plan:    I have personally reviewed and noted the following in  the patient's chart:   Medical and social history Use of alcohol, tobacco or illicit drugs  Current medications and supplements including opioid prescriptions. Patient is currently taking opioid prescriptions. Information provided to patient regarding non-opioid alternatives. Patient advised to discuss non-opioid treatment plan with their provider. Functional ability and status Nutritional status Physical activity Advanced directives List of other physicians Hospitalizations, surgeries, and ER visits in previous 12 months Vitals Screenings to include cognitive, depression, and falls Referrals and appointments  In addition, I have reviewed and discussed with patient certain preventive protocols, quality metrics, and best practice recommendations. A written personalized care plan for preventive services as well as general preventive health recommendations were provided to patient.   Roz LOISE Fuller, LPN   2/84/7974   After Visit Summary: (MyChart) Due to this being a telephonic visit, the after visit summary with patients personalized plan was offered to patient via MyChart   Notes: Patient aware of current care gaps.  Immunization record was verified by [employer].

## 2024-07-28 ENCOUNTER — Ambulatory Visit: Admitting: Podiatry

## 2024-07-31 DIAGNOSIS — M170 Bilateral primary osteoarthritis of knee: Secondary | ICD-10-CM | POA: Insufficient documentation

## 2024-07-31 DIAGNOSIS — M47816 Spondylosis without myelopathy or radiculopathy, lumbar region: Secondary | ICD-10-CM | POA: Diagnosis not present

## 2024-07-31 DIAGNOSIS — Z79899 Other long term (current) drug therapy: Secondary | ICD-10-CM | POA: Diagnosis not present

## 2024-07-31 DIAGNOSIS — F17210 Nicotine dependence, cigarettes, uncomplicated: Secondary | ICD-10-CM | POA: Diagnosis not present

## 2024-07-31 DIAGNOSIS — Z9181 History of falling: Secondary | ICD-10-CM | POA: Diagnosis not present

## 2024-07-31 DIAGNOSIS — M1711 Unilateral primary osteoarthritis, right knee: Secondary | ICD-10-CM | POA: Diagnosis not present

## 2024-07-31 DIAGNOSIS — E1169 Type 2 diabetes mellitus with other specified complication: Secondary | ICD-10-CM | POA: Diagnosis not present

## 2024-07-31 DIAGNOSIS — G629 Polyneuropathy, unspecified: Secondary | ICD-10-CM | POA: Diagnosis not present

## 2024-08-04 ENCOUNTER — Telehealth: Payer: Self-pay | Admitting: Internal Medicine

## 2024-08-04 NOTE — Telephone Encounter (Signed)
 Called to confirm appt.LVM.

## 2024-08-05 ENCOUNTER — Encounter: Payer: Self-pay | Admitting: Pharmacist

## 2024-08-05 ENCOUNTER — Other Ambulatory Visit (HOSPITAL_BASED_OUTPATIENT_CLINIC_OR_DEPARTMENT_OTHER): Payer: Self-pay | Admitting: Pharmacist

## 2024-08-05 DIAGNOSIS — I10 Essential (primary) hypertension: Secondary | ICD-10-CM

## 2024-08-05 NOTE — Progress Notes (Signed)
 Pharmacy Quality Measure Review  This patient is appearing on a report for being at risk of failing the adherence measure for hypertension (ACEi/ARB) medications this calendar year.   Medication: valsartan  Last fill date: 07/31/2024 for 90 day supply  Insurance report was not up to date. No action needed at this time.  Rxn is ready for pick-up at her pharmacy. I contacted the patient and she is picking up today.   Herlene Fleeta Morris, PharmD, JAQUELINE, CPP Clinical Pharmacist [facility] [phone]

## 2024-08-06 ENCOUNTER — Ambulatory Visit: Admitting: Pharmacist

## 2024-08-10 ENCOUNTER — Encounter: Payer: Self-pay | Admitting: Podiatry

## 2024-08-10 ENCOUNTER — Ambulatory Visit (INDEPENDENT_AMBULATORY_CARE_PROVIDER_SITE_OTHER): Admitting: Podiatry

## 2024-08-10 DIAGNOSIS — L84 Corns and callosities: Secondary | ICD-10-CM

## 2024-08-10 DIAGNOSIS — E1169 Type 2 diabetes mellitus with other specified complication: Secondary | ICD-10-CM

## 2024-08-10 DIAGNOSIS — E669 Obesity, unspecified: Secondary | ICD-10-CM

## 2024-08-10 DIAGNOSIS — E1142 Type 2 diabetes mellitus with diabetic polyneuropathy: Secondary | ICD-10-CM

## 2024-08-10 MED ORDER — LIDOCAINE-PRILOCAINE 2.5-2.5 % EX CREA: 1.0000 | TOPICAL_CREAM | CUTANEOUS | 0 refills | Status: AC | PRN

## 2024-08-10 NOTE — Progress Notes (Signed)
 This patient presents to the office for painful callus on both feet.  Patient says the callus are painful walking and wearing her shoes.  She was treated with debridement previous visit.She also has obtained diabetic shoes.  She has history of diabetes.  She was also prescribed lidocaine  cream.  She presents for evaluation and treatment.  General Appearance  Alert, conversant and in no acute stress.  Vascular  Dorsalis pedis and posterior tibial  pulses are palpable  bilaterally.  Capillary return is within normal limits  bilaterally. Temperature is within normal limits  bilaterally.  Neurologic  Senn-Weinstein monofilament wire test within normal limits  bilaterally. Muscle power within normal limits bilaterally.  Nails Thick disfigured discolored nails with subungual debris  from hallux to fifth toes bilaterally. No evidence of bacterial infection or drainage bilaterally.  Orthopedic  No limitations of motion  feet .  No crepitus or effusions noted.  No bony pathology or digital deformities noted. Plantar flexed 1st ray  B/L.  Sesamoiditis left foot.  Skin  normotropic skin with no porokeratosis noted bilaterally.  No signs of infections or ulcers noted.   Callus left hallux and sub sesamoid right foot.  No callus left sesamoid.    Callus B/L.    Debride callus with # 15 blade and dremel tool.     Prescribe lidocaine  cream.  RTC 3 months    .GAM

## 2024-08-18 ENCOUNTER — Encounter: Payer: Self-pay | Admitting: Plastic Surgery

## 2024-08-18 ENCOUNTER — Ambulatory Visit (INDEPENDENT_AMBULATORY_CARE_PROVIDER_SITE_OTHER): Admitting: Plastic Surgery

## 2024-08-18 VITALS — BP 134/84 | HR 70 | Ht 67.0 in | Wt 256.2 lb

## 2024-08-18 DIAGNOSIS — L905 Scar conditions and fibrosis of skin: Secondary | ICD-10-CM | POA: Diagnosis not present

## 2024-08-18 NOTE — Progress Notes (Signed)
 Patient ID: Karen Tanner, female    DOB: [birth year], 53 y.o.   MRN: [id number]   Chief Complaint  Patient presents with   consult    The patient is a 53 year old female here for evaluation of her face.  She had severe acne in her early years.  She is currently smoking.  She has some pitting of her face from the acne scarring.  She also has some discoloration with darkening under the eye area and upper cheeks.  I do not see any areas of concerning skin lesions.  She is 5 feet 7 inches tall and weighs 256 pounds.  Has had surgery in the past and healed without incident.    Review of Systems  Constitutional: Negative.   Eyes: Negative.   Respiratory: Negative.    Cardiovascular: Negative.   Gastrointestinal: Negative.   Genitourinary: Negative.   Musculoskeletal: Negative.     Past Medical History:  Diagnosis Date   Anemia    Arthritis    knee   Diabetes mellitus without complication (HCC)    Hypertension    Hypertensive retinopathy     Past Surgical History:  Procedure Laterality Date   BACK SURGERY     Bone fusion   BREAST LUMPECTOMY Left 03/09/2022   intraductal papilloma with calcifications   BREAST LUMPECTOMY WITH RADIOACTIVE SEED LOCALIZATION Left 03/09/2022   Procedure: LEFT BREAST LUMPECTOMY WITH RADIOACTIVE SEED LOCALIZATION;  Surgeon: Curvin Deward MOULD, MD;  Location: [facility];  Service: General;  Laterality: Left;      Current Outpatient Medications:    Accu-Chek Softclix Lancets lancets, Use to check blood sugar once daily. Dx: E11.69, Disp: 100 each, Rfl: 2   amLODipine  (NORVASC ) 10 MG tablet, TAKE 1 TABLET BY MOUTH DAILY, Disp: 100 tablet, Rfl: 2   atorvastatin  (LIPITOR) 20 MG tablet, TAKE 1 TABLET BY MOUTH EVERY DAY, Disp: 90 tablet, Rfl: 2   Blood Glucose Monitoring Suppl (ACCU-CHEK GUIDE) w/Device KIT, Use to check blood sugar once daily. Dx: E11.69, Disp: 1 kit, Rfl: 0   Blood Pressure Monitor KIT, Use to check blood pressure daily,  Disp: 1 kit, Rfl: 0   Continuous Glucose Receiver (FREESTYLE LIBRE 3 READER) DEVI, UAD to check blood sugars, Disp: 1 each, Rfl: 0   Continuous Glucose Sensor (FREESTYLE LIBRE 3 PLUS SENSOR) MISC, Change sensor every 15 days., Disp: 2 each, Rfl: 11   gabapentin  (NEURONTIN ) 300 MG capsule, TAKE 2 CAPSULES BY MOUTH 3 TIMES DAILY, Disp: 360 capsule, Rfl: 5   glucose blood (ACCU-CHEK GUIDE) test strip, Use to check blood sugar once daily. Dx: E11.69, Disp: 100 each, Rfl: 12   HYDROcodone-acetaminophen  (NORCO) 10-325 MG tablet, Take 1 tablet by mouth 3 (three) times daily as needed., Disp: , Rfl:    lidocaine -prilocaine  (EMLA ) cream, Apply 1 Application topically as needed., Disp: 30 g, Rfl: 0   meloxicam  (MOBIC ) 15 MG tablet, TAKE 1 TABLET BY MOUTH DAILY, Disp: 100 tablet, Rfl: 0   methocarbamol  (ROBAXIN ) 500 MG tablet, TAKE 1 TABLET (500 MG TOTAL) BY MOUTH 2 (TWO) TIMES DAILY AS NEEDED FOR MUSCLE SPASMS., Disp: 60 tablet, Rfl: 0   Misc. Devices MISC, Blood pressure device, Disp: 1 Device, Rfl: 0   Semaglutide ,0.25 or 0.5MG /DOS, 2 MG/3ML SOPN, Inject 0.25 mg into the skin once a week., Disp: 3 mL, Rfl: 3   valsartan  (DIOVAN ) 80 MG tablet, Take 1 tablet (80 mg total) by mouth daily., Disp: 90 tablet, Rfl: 1   Objective:  Vitals:   08/18/24 1330  BP: 134/84  Pulse: 70  SpO2: 96%    Physical Exam Vitals reviewed.  Cardiovascular:     Rate and Rhythm: Normal rate.     Pulses: Normal pulses.  Skin:    General: Skin is warm.     Capillary Refill: Capillary refill takes less than 2 seconds.     Coloration: Skin is not jaundiced.     Findings: Lesion present. No bruising.  Neurological:     Mental Status: She is alert.  Psychiatric:        Mood and Affect: Mood normal.        Behavior: Behavior normal.        Thought Content: Thought content normal.        Judgment: Judgment normal.     Assessment & Plan:  Acne scarring  The patient is a candidate for Sente cysteamine and Moxi.  I  think Moxi would improve the texture and the cream he would improve the color.  Pictures were obtained of the patient and placed in the chart with the patient's or guardian's permission.  Patient is good to think over her options and get some quotes. Karen RAMAN Doneshia Hill, DO

## 2024-08-26 ENCOUNTER — Other Ambulatory Visit: Payer: Self-pay | Admitting: Internal Medicine

## 2024-08-26 DIAGNOSIS — E1142 Type 2 diabetes mellitus with diabetic polyneuropathy: Secondary | ICD-10-CM

## 2024-08-28 DIAGNOSIS — G629 Polyneuropathy, unspecified: Secondary | ICD-10-CM | POA: Diagnosis not present

## 2024-08-28 DIAGNOSIS — Z79899 Other long term (current) drug therapy: Secondary | ICD-10-CM | POA: Diagnosis not present

## 2024-08-28 DIAGNOSIS — M1711 Unilateral primary osteoarthritis, right knee: Secondary | ICD-10-CM | POA: Diagnosis not present

## 2024-08-28 DIAGNOSIS — M549 Dorsalgia, unspecified: Secondary | ICD-10-CM | POA: Diagnosis not present

## 2024-08-28 DIAGNOSIS — F17210 Nicotine dependence, cigarettes, uncomplicated: Secondary | ICD-10-CM | POA: Diagnosis not present

## 2024-08-28 DIAGNOSIS — M25572 Pain in left ankle and joints of left foot: Secondary | ICD-10-CM | POA: Diagnosis not present

## 2024-08-28 DIAGNOSIS — E1169 Type 2 diabetes mellitus with other specified complication: Secondary | ICD-10-CM | POA: Diagnosis not present

## 2024-08-28 DIAGNOSIS — M47816 Spondylosis without myelopathy or radiculopathy, lumbar region: Secondary | ICD-10-CM | POA: Diagnosis not present

## 2024-08-28 DIAGNOSIS — Z9181 History of falling: Secondary | ICD-10-CM | POA: Diagnosis not present

## 2024-08-28 DIAGNOSIS — G8929 Other chronic pain: Secondary | ICD-10-CM | POA: Diagnosis not present

## 2024-09-04 ENCOUNTER — Telehealth: Payer: Self-pay | Admitting: Internal Medicine

## 2024-09-04 NOTE — Telephone Encounter (Signed)
 Called patient to confirm upcoming appointment 09/07/2024. Patient appointment has been successfully confirmed

## 2024-09-07 ENCOUNTER — Ambulatory Visit: Admitting: Internal Medicine

## 2024-09-09 ENCOUNTER — Other Ambulatory Visit: Payer: Self-pay | Admitting: Physician Assistant

## 2024-09-09 DIAGNOSIS — E1169 Type 2 diabetes mellitus with other specified complication: Secondary | ICD-10-CM

## 2024-09-10 NOTE — Telephone Encounter (Signed)
 Requested Prescriptions  Refused Prescriptions Disp Refills   atorvastatin  (LIPITOR) 20 MG tablet [Pharmacy Med Name: Atorvastatin  Calcium  20 MG Oral Tablet] 80 tablet 3    Sig: TAKE 1 TABLET BY MOUTH DAILY     Cardiovascular:  Antilipid - Statins Failed - 09/10/2024  3:29 PM      Failed - Lipid Panel in normal range within the last 12 months    Cholesterol, Total  Date Value Ref Range Status  09/05/2023 154 100 - 199 mg/dL Final   LDL Chol Calc (NIH)  Date Value Ref Range Status  09/05/2023 75 0 - 99 mg/dL Final   HDL  Date Value Ref Range Status  09/05/2023 56 >39 mg/dL Final   Triglycerides  Date Value Ref Range Status  09/05/2023 133 0 - 149 mg/dL Final         Passed - Patient is not pregnant      Passed - Valid encounter within last 12 months    Recent Outpatient Visits           2 months ago Type 2 diabetes mellitus with morbid obesity (HCC)   [facility] [place]. [facility] Vicci Sober B, MD   4 months ago Type 2 diabetes mellitus with morbid obesity [facility])   [facility] [place]. [facility] Vicci Sober B, MD   8 months ago Type 2 diabetes mellitus with morbid obesity [facility])   [facility] Dept Of [place]. [facility] Vicci Sober NOVAK, MD   10 months ago Toe pain, left   [facility] [place]. [facility] Vicci Sober NOVAK, MD   1 year ago Type 2 diabetes mellitus with obesity [facility])    [facility] [place]. [facility] Vicci Sober NOVAK, MD

## 2024-09-28 DIAGNOSIS — Z79899 Other long term (current) drug therapy: Secondary | ICD-10-CM | POA: Diagnosis not present

## 2024-09-28 DIAGNOSIS — M47816 Spondylosis without myelopathy or radiculopathy, lumbar region: Secondary | ICD-10-CM | POA: Diagnosis not present

## 2024-09-28 DIAGNOSIS — M25572 Pain in left ankle and joints of left foot: Secondary | ICD-10-CM | POA: Diagnosis not present

## 2024-09-28 DIAGNOSIS — M549 Dorsalgia, unspecified: Secondary | ICD-10-CM | POA: Diagnosis not present

## 2024-09-28 DIAGNOSIS — F17210 Nicotine dependence, cigarettes, uncomplicated: Secondary | ICD-10-CM | POA: Diagnosis not present

## 2024-09-28 DIAGNOSIS — G629 Polyneuropathy, unspecified: Secondary | ICD-10-CM | POA: Diagnosis not present

## 2024-09-28 DIAGNOSIS — Z9181 History of falling: Secondary | ICD-10-CM | POA: Diagnosis not present

## 2024-09-28 DIAGNOSIS — G8929 Other chronic pain: Secondary | ICD-10-CM | POA: Diagnosis not present

## 2024-10-15 ENCOUNTER — Encounter: Payer: Self-pay | Admitting: Internal Medicine

## 2024-10-15 ENCOUNTER — Ambulatory Visit: Attending: Internal Medicine | Admitting: Internal Medicine

## 2024-10-15 DIAGNOSIS — Z23 Encounter for immunization: Secondary | ICD-10-CM

## 2024-10-15 DIAGNOSIS — I152 Hypertension secondary to endocrine disorders: Secondary | ICD-10-CM | POA: Diagnosis not present

## 2024-10-15 DIAGNOSIS — E1169 Type 2 diabetes mellitus with other specified complication: Secondary | ICD-10-CM

## 2024-10-15 DIAGNOSIS — F17210 Nicotine dependence, cigarettes, uncomplicated: Secondary | ICD-10-CM | POA: Diagnosis not present

## 2024-10-15 DIAGNOSIS — Z7985 Long-term (current) use of injectable non-insulin antidiabetic drugs: Secondary | ICD-10-CM | POA: Diagnosis not present

## 2024-10-15 DIAGNOSIS — E1159 Type 2 diabetes mellitus with other circulatory complications: Secondary | ICD-10-CM

## 2024-10-15 DIAGNOSIS — M25572 Pain in left ankle and joints of left foot: Secondary | ICD-10-CM | POA: Diagnosis not present

## 2024-10-15 DIAGNOSIS — M1711 Unilateral primary osteoarthritis, right knee: Secondary | ICD-10-CM

## 2024-10-15 DIAGNOSIS — E785 Hyperlipidemia, unspecified: Secondary | ICD-10-CM | POA: Diagnosis not present

## 2024-10-15 DIAGNOSIS — Z6841 Body Mass Index (BMI) 40.0 and over, adult: Secondary | ICD-10-CM

## 2024-10-15 DIAGNOSIS — Z79899 Other long term (current) drug therapy: Secondary | ICD-10-CM | POA: Diagnosis not present

## 2024-10-15 LAB — POCT GLYCOSYLATED HEMOGLOBIN (HGB A1C): HbA1c, POC (controlled diabetic range): 6.1 % (ref 0.0–7.0)

## 2024-10-15 MED ORDER — VALSARTAN 160 MG PO TABS: 160.0000 mg | ORAL_TABLET | Freq: Every day | ORAL | 1 refills | Status: AC

## 2024-10-15 MED ORDER — SEMAGLUTIDE(0.25 OR 0.5MG/DOS) 2 MG/3ML ~~LOC~~ SOPN: 0.5000 mg | PEN_INJECTOR | SUBCUTANEOUS | 0 refills | Status: DC

## 2024-10-15 NOTE — Patient Instructions (Addendum)
  VISIT SUMMARY: Today, you had a follow-up appointment to review your diabetes, hypertension, and hyperlipidemia management. We also discussed your left ankle pain and right knee issues. Your diabetes is well-controlled, but your blood pressure needs better management. We also addressed your generalized body aches and discussed potential physical therapy.  YOUR PLAN: -TYPE 2 DIABETES MELLITUS: Your diabetes is well-controlled with an A1c of 6.1%. We will increase your Ozempic  dose to 0.5 mg weekly. If you tolerate this well after four doses, we will increase it to 1 mg. You should discontinue Glimepiride  due to the increase in Ozempic . Continue to engage in physical activity as much as you can tolerate.  -HYPERTENSION: Your blood pressure is not optimally controlled, with a reading of 147/81 mmHg. We will increase your Valsartan  dose to 160 mg daily. Please take two 80 mg tablets until your current supply is exhausted, then switch to the 160 mg tablets. We will recheck your blood pressure at your next visit.  -RIGHT KNEE OSTEOARTHRITIS: You have osteoarthritis in your right knee, and you have a planned cortisone injection to help manage the pain and inflammation.  -LEFT ANKLE PAIN AND SWELLING, POSSIBLE OSTEOARTHRITIS: You have been experiencing pain and swelling in your left ankle for two months, which may be due to osteoarthritis. We are waiting for the x-ray results. We will refer you to [facility] for further evaluation and encourage you to use meloxicam  for inflammation.  -HYPERLIPIDEMIA: Your hyperlipidemia is being managed with atorvastatin . Continue taking your medication as prescribed.  INSTRUCTIONS: Please call after four doses of the increased Ozempic  if you are tolerating it well, so we can increase it to 1 mg. Recheck your blood pressure at your next visit. Use meloxicam  for your left ankle inflammation as needed. Follow up with [facility] for further evaluation of your left ankle  pain.                      Contains text generated by Abridge.                                 Contains text generated by Abridge.

## 2024-10-15 NOTE — Progress Notes (Signed)
 Patient ID: Karen Tanner, female    DOB: March 01, 1971  MRN: [id number]  CC: Diabetes (DM f/u./Reports increased fatigue, bodily aches & pains - requesting to go back to PT/L ankle pain X2 mo/Flu vax administered on 10/15/24 - C.A.)   Subjective: Karen Tanner is a 53 y.o. female who presents for chronic ds management. Her concerns today include:  Patient with history of HTN, diabetes 2 with peripheral neuropathy, HL, tob dep, anemia, chronic LBP had fusion surgery 2004, OA RT knee, history of papilloma LT breast 02/2022 (does not need chemoprophylaxis per onc Dr. Loretha), fibroid   Discussed the use of AI scribe software for clinical note transcription with the patient, who gave verbal consent to proceed.  History of Present Illness Karen Tanner is a 53 year old female with diabetes, hypertension, and hyperlipidemia who presents for follow-up.  DM:  Results for orders placed or performed in visit on 10/15/24  POCT glycosylated hemoglobin (Hb A1C)   Collection Time: 10/15/24  3:59 PM  Result Value Ref Range   Hemoglobin A1C     HbA1c POC (<> result, manual entry)     HbA1c, POC (prediabetic range)     HbA1c, POC (controlled diabetic range) 6.1 0.0 - 7.0 %  she was started on Ozempic  0.25 mg on last visit in July for diabetes management and weight loss. She was suppose to stop Amaryl  after starting Ozempic  but still taking. She has not experienced any side effects such as vomiting or diarrhea from the medication. She missed a follow-up appointment with the clinical pharmacist for dose titration. Despite minimal dietary changes, she has recently started incorporating more fruit into her diet and reports no weight loss. -[facility] app[facility] is scheduled for 11/2024.  She experiences significant pain in her left ankle, which began approximately two months ago. The pain started suddenly upon waking up one night to use the bathroom and has persisted, making walking painful. She  is unsure if there is swelling in the ankle, but the pain is severe. An x-ray was done by her pain management clinic at [facility] two months ago, but she has not received the results. Also c/o pain and giving out of RT knee.  She has an upcoming appointment at [facility] for a cortisone injection in her right knee. Orthopedics is treating her right knee for arthritis. She experiences generalized body aches and suspects she may need physical therapy to strengthen her muscles. She has not been very active due to the pain in her ankle and knee. She uses hydrocodone for pain management and has meloxicam  available but has not used it recently.  HTN: She is currently taking Valsartan  80 mg, Amlodipine  10 mg, and Atorvastatin  for her hypertension and hyperlipidemia. She does not regularly check her blood pressure at home. Her blood pressure was 147/81 during the visit.     Patient Active Problem List   Diagnosis Date Noted   Acne scarring 08/18/2024   Intraductal papilloma 12/20/2021   Drinking binge 04/17/2021   Tobacco dependence 04/17/2021   Hyperlipidemia associated with type 2 diabetes mellitus (HCC) 04/17/2021   Uterine leiomyoma 08/21/2018   Type 2 diabetes mellitus with obesity 05/21/2016   History of back surgery 10/26/2014   History of anemia 10/26/2014   Essential hypertension 10/26/2014   Chronic low back pain 10/26/2014   Menorrhagia with regular cycle 10/26/2014     Current Outpatient Medications on File Prior to Visit  Medication Sig Dispense Refill  Accu-Chek Softclix Lancets lancets Use to check blood sugar once daily. Dx: E11.69 100 each 2   amLODipine  (NORVASC ) 10 MG tablet TAKE 1 TABLET BY MOUTH DAILY 100 tablet 2   atorvastatin  (LIPITOR) 20 MG tablet TAKE 1 TABLET BY MOUTH EVERY DAY 90 tablet 2   Blood Glucose Monitoring Suppl (ACCU-CHEK GUIDE) w/Device KIT Use to check blood sugar once daily. Dx: E11.69 1 kit 0   Blood Pressure Monitor KIT Use to check blood  pressure daily 1 kit 0   Continuous Glucose Receiver (FREESTYLE LIBRE 3 READER) DEVI UAD to check blood sugars 1 each 0   Continuous Glucose Sensor (FREESTYLE LIBRE 3 PLUS SENSOR) MISC Change sensor every 15 days. 2 each 11   gabapentin  (NEURONTIN ) 300 MG capsule TAKE 2 CAPSULES BY MOUTH 3 TIMES DAILY 360 capsule 5   glucose blood (ACCU-CHEK GUIDE) test strip Use to check blood sugar once daily. Dx: E11.69 100 each 12   HYDROcodone-acetaminophen  (NORCO) 10-325 MG tablet Take 1 tablet by mouth 3 (three) times daily as needed.     lidocaine -prilocaine  (EMLA ) cream Apply 1 Application topically as needed. 30 g 0   meloxicam  (MOBIC ) 15 MG tablet TAKE 1 TABLET BY MOUTH DAILY 100 tablet 0   methocarbamol  (ROBAXIN ) 500 MG tablet TAKE 1 TABLET (500 MG TOTAL) BY MOUTH 2 (TWO) TIMES DAILY AS NEEDED FOR MUSCLE SPASMS. 60 tablet 0   Misc. Devices MISC Blood pressure device 1 Device 0   No current facility-administered medications on file prior to visit.    No Known Allergies  Social History   Socioeconomic History   Marital status: Single    Spouse name: Not on file   Number of children: Not on file   Years of education: Not on file   Highest education level: GED or equivalent  Occupational History   Not on file  Tobacco Use   Smoking status: Every Day    Current packs/day: 0.25    Average packs/day: 0.3 packs/day for 14.0 years (3.5 ttl pk-yrs)    Types: Cigarettes   Smokeless tobacco: Never   Tobacco comments:    Smoking 6-7 cigs per day  Vaping Use   Vaping status: Never Used  Substance and Sexual Activity   Alcohol use: Yes    Comment: 5-6 drinks per day on weekends   Drug use: Yes    Types: Marijuana   Sexual activity: Not on file  Other Topics Concern   Not on file  Social History Narrative   Not on file   Social Drivers of Health   Financial Resource Strain: Medium Risk (07/14/2024)   Overall Financial Resource Strain (CARDIA)    Difficulty of Paying Living Expenses:  Somewhat hard  Food Insecurity: Food Insecurity Present (07/14/2024)   Hunger Vital Sign    Worried About Running Out of Food in the Last Year: Sometimes true    Ran Out of Food in the Last Year: Sometimes true  Transportation Needs: Unmet Transportation Needs (07/14/2024)   PRAPARE - Transportation    Lack of Transportation (Medical): No    Lack of Transportation (Non-Medical): Yes  Physical Activity: Inactive (07/14/2024)   Exercise Vital Sign    Days of Exercise per Week: 0 days    Minutes of Exercise per Session: 0 min  Stress: No Stress Concern Present (07/14/2024)   [employer] of Occupational Health - Occupational Stress Questionnaire    Feeling of Stress: Not at all  Social Connections: Moderately Integrated (07/14/2024)   Social Connection  and Isolation Panel    Frequency of Communication with Friends and Family: More than three times a week    Frequency of Social Gatherings with Friends and Family: More than three times a week    Attends Religious Services: More than 4 times per year    Active Member of [employer] or Organizations: Yes    Attends Banker Meetings: 1 to 4 times per year    Marital Status: Never married  Intimate Partner Violence: Not At Risk (07/14/2024)   Humiliation, Afraid, Rape, and Kick questionnaire    Fear of Current or Ex-Partner: No    Emotionally Abused: No    Physically Abused: No    Sexually Abused: No    Family History  Problem Relation Age of Onset   Diabetes Mother    Hyperlipidemia Mother    Heart disease Mother    Diabetes Father    Diabetes Maternal Grandmother    BRCA 1/2 Neg Hx    Breast cancer Neg Hx     Past Surgical History:  Procedure Laterality Date   BACK SURGERY     Bone fusion   BREAST LUMPECTOMY Left 03/09/2022   intraductal papilloma with calcifications   BREAST LUMPECTOMY WITH RADIOACTIVE SEED LOCALIZATION Left 03/09/2022   Procedure: LEFT BREAST LUMPECTOMY WITH RADIOACTIVE SEED LOCALIZATION;  Surgeon:  Curvin Deward MOULD, MD;  Location: [facility];  Service: General;  Laterality: Left;    ROS: Review of Systems Negative except as stated above  PHYSICAL EXAM: BP (!) 147/81   Pulse 67   Temp 98.2 F (36.8 C) (Oral)   Ht 5' 7 (1.702 m)   Wt 258 lb (117 kg)   LMP  (LMP Unknown) Comment: Patient states last period was over a year ago  SpO2 97%   BMI 40.41 kg/m   Wt Readings from Last 3 Encounters:  10/15/24 258 lb (117 kg)  08/18/24 256 lb 3.2 oz (116.2 kg)  07/14/24 260 lb (117.9 kg)    Physical Exam  General appearance - alert, well appearing, and in no distress Mental status - normal mood, behavior, speech, dress, motor activity, and thought processes Neck - supple, no significant adenopathy Chest - clear to auscultation, no wheezes, rales or rhonchi, symmetric air entry Heart - normal rate, regular rhythm, normal S1, S2, no murmurs, rubs, clicks or gallops Musculoskeletal - LT ankle: mild to moderate jt enlargement. No erythema or edema. No point tenderness and good ROM. Rt knee: jt is enlarged. No point tenderness.  Extremities - no Le edema      Latest Ref Rng & Units 06/05/2023    3:00 PM 09/18/2022    4:53 PM 09/07/2022    1:37 AM  CMP  Glucose 70 - 99 mg/dL 886  78  818   BUN 6 - 24 mg/dL 20  11  13    Creatinine 0.57 - 1.00 mg/dL 9.07  9.41  8.95   Sodium 134 - 144 mmol/L 140  139  142   Potassium 3.5 - 5.2 mmol/L 4.7  3.4  4.1   Chloride 96 - 106 mmol/L 101  102  101   CO2 20 - 29 mmol/L 26  28  25    Calcium  8.7 - 10.2 mg/dL 9.8  9.6  9.7   Total Protein 6.0 - 8.5 g/dL 7.4  7.4  8.1   Total Bilirubin 0.0 - 1.2 mg/dL 0.4  0.5  0.2   Alkaline Phos 44 - 121 IU/L 93  61  74   AST 0 - 40 IU/L 32  23  25   ALT 0 - 32 IU/L 25  20  21     Lipid Panel     Component Value Date/Time   CHOL 154 09/05/2023 1523   TRIG 133 09/05/2023 1523   HDL 56 09/05/2023 1523   CHOLHDL 2.8 09/05/2023 1523   CHOLHDL 3.2 01/23/2016 0956   VLDL 9 01/23/2016 0956    LDLCALC 75 09/05/2023 1523    CBC    Component Value Date/Time   WBC 6.3 09/05/2023 1523   WBC 6.8 09/18/2022 1653   RBC 4.73 09/05/2023 1523   RBC 4.82 09/18/2022 1653   HGB 13.2 09/05/2023 1523   HCT 39.7 09/05/2023 1523   PLT 257 09/05/2023 1523   MCV 84 09/05/2023 1523   MCH 27.9 09/05/2023 1523   MCH 27.8 09/18/2022 1653   MCHC 33.2 09/05/2023 1523   MCHC 32.6 09/18/2022 1653   RDW 13.5 09/05/2023 1523   LYMPHSABS 1.3 09/07/2022 0137   MONOABS 0.8 09/07/2022 0137   EOSABS 0.0 09/07/2022 0137   BASOSABS 0.1 09/07/2022 0137    ASSESSMENT AND PLAN: 1. Type 2 diabetes mellitus with morbid obesity (HCC) (Primary) A1C at goal Encourage healthy eating habits - Increase Ozempic  to 0.5 mg weekly. Call after four doses if tolerating well to increase to 1 mg.  Advised to stop the medicine and come in if she develops any vomiting, abdominal pain, severe diarrhea or palpitations. - Discontinue glimepiride  due to Ozempic  dose increase. - POCT glucose (manual entry) - POCT glycosylated hemoglobin (Hb A1C) - Semaglutide ,0.25 or 0.5MG /DOS, 2 MG/3ML SOPN; Inject 0.5 mg into the skin once a week.  Dispense: 3 mL; Refill: 0 - CBC; Future - Comprehensive metabolic panel with GFR; Future - Lipid panel; Future - Microalbumin / creatinine urine ratio; Future  2. Long-term (current) use of injectable non-insulin antidiabetic drugs See #1 above  3. Hypertension associated with type 2 diabetes mellitus (HCC) Not at goal. Recommend increase Diovan  to 160 mg daily. Continue Norvasc  10 mg daily - valsartan  (DIOVAN ) 160 MG tablet; Take 1 tablet (160 mg total) by mouth daily.  Dispense: 90 tablet; Refill: 1  4. Hyperlipidemia associated with type 2 diabetes mellitus (HCC) Continue Lipitor 20 mg daily  5. Acute left ankle pain  possibly osteoarthritis. Has f/u with pain management this mth. I request that she has them forward results of x-ray report to me. Will submit referral to her  orthopedics for this. - Submit referral to [facility] for further evaluation. - Encourage use of meloxicam  for inflammation. - AMB referral to orthopedics  6. Osteoarthritis of right knee, unspecified osteoarthritis type Keep upcoming f/u with ortho. Hold off on PT referral until seen by ortho  7. Need for influenza vaccination - Flu vaccine trivalent PF, 6mos and older(Flulaval,Afluria,Fluarix,Fluzone)   Patient was given the opportunity to ask questions.  Patient verbalized understanding of the plan and was able to repeat key elements of the plan.   This documentation was completed using Paediatric nurse.  Any transcriptional errors are unintentional.  Orders Placed This Encounter  Procedures   Flu vaccine trivalent PF, 6mos and older(Flulaval,Afluria,Fluarix,Fluzone)   CBC   Comprehensive metabolic panel with GFR   Lipid panel   Microalbumin / creatinine urine ratio   AMB referral to orthopedics   POCT glucose (manual entry)   POCT glycosylated hemoglobin (Hb A1C)     Requested Prescriptions   Signed Prescriptions Disp Refills  Semaglutide ,0.25 or 0.5MG /DOS, 2 MG/3ML SOPN 3 mL 0    Sig: Inject 0.5 mg into the skin once a week.   valsartan  (DIOVAN ) 160 MG tablet 90 tablet 1    Sig: Take 1 tablet (160 mg total) by mouth daily.    Return in about 7 weeks (around 12/03/2024) for f/u obesity. Give lab visit for next wk.  Barnie Louder, MD, FACP

## 2024-10-17 ENCOUNTER — Other Ambulatory Visit: Payer: Self-pay | Admitting: Internal Medicine

## 2024-10-17 ENCOUNTER — Other Ambulatory Visit: Payer: Self-pay | Admitting: Podiatry

## 2024-10-17 DIAGNOSIS — E1159 Type 2 diabetes mellitus with other circulatory complications: Secondary | ICD-10-CM

## 2024-10-20 ENCOUNTER — Ambulatory Visit: Attending: Internal Medicine

## 2024-10-20 DIAGNOSIS — E1169 Type 2 diabetes mellitus with other specified complication: Secondary | ICD-10-CM | POA: Diagnosis not present

## 2024-10-21 ENCOUNTER — Ambulatory Visit: Payer: Self-pay | Admitting: Internal Medicine

## 2024-10-21 LAB — CBC
Hematocrit: 43.2 % (ref 34.0–46.6)
Hemoglobin: 13.7 g/dL (ref 11.1–15.9)
MCH: 27.2 pg (ref 26.6–33.0)
MCHC: 31.7 g/dL (ref 31.5–35.7)
MCV: 86 fL (ref 79–97)
Platelets: 244 x10E3/uL (ref 150–450)
RBC: 5.03 x10E6/uL (ref 3.77–5.28)
RDW: 14.3 % (ref 11.7–15.4)
WBC: 5.9 x10E3/uL (ref 3.4–10.8)

## 2024-10-21 LAB — COMPREHENSIVE METABOLIC PANEL WITH GFR
ALT: 18 IU/L (ref 0–32)
AST: 24 IU/L (ref 0–40)
Albumin: 4.6 g/dL (ref 3.8–4.9)
Alkaline Phosphatase: 94 IU/L (ref 49–135)
BUN/Creatinine Ratio: 17 (ref 9–23)
BUN: 11 mg/dL (ref 6–24)
Bilirubin Total: 0.3 mg/dL (ref 0.0–1.2)
CO2: 24 mmol/L (ref 20–29)
Calcium: 10 mg/dL (ref 8.7–10.2)
Chloride: 101 mmol/L (ref 96–106)
Creatinine, Ser: 0.66 mg/dL (ref 0.57–1.00)
Globulin, Total: 2.8 g/dL (ref 1.5–4.5)
Glucose: 61 mg/dL — ABNORMAL LOW (ref 70–99)
Potassium: 4.5 mmol/L (ref 3.5–5.2)
Sodium: 140 mmol/L (ref 134–144)
Total Protein: 7.4 g/dL (ref 6.0–8.5)
eGFR: 104 mL/min/1.73 (ref >59)

## 2024-10-21 LAB — LIPID PANEL
Chol/HDL Ratio: 2.7 ratio (ref 0.0–4.4)
Cholesterol, Total: 162 mg/dL (ref 100–199)
HDL: 60 mg/dL (ref >39)
LDL Chol Calc (NIH): 84 mg/dL (ref 0–99)
Triglycerides: 98 mg/dL (ref 0–149)
VLDL Cholesterol Cal: 18 mg/dL (ref 5–40)

## 2024-10-21 LAB — MICROALBUMIN / CREATININE URINE RATIO
Creatinine, Urine: 9.6 mg/dL
Microalbumin, Urine: <3 ug/mL

## 2024-10-26 DIAGNOSIS — E559 Vitamin D deficiency, unspecified: Secondary | ICD-10-CM | POA: Diagnosis not present

## 2024-10-26 DIAGNOSIS — R5383 Other fatigue: Secondary | ICD-10-CM | POA: Diagnosis not present

## 2024-10-26 DIAGNOSIS — F17210 Nicotine dependence, cigarettes, uncomplicated: Secondary | ICD-10-CM | POA: Diagnosis not present

## 2024-10-26 DIAGNOSIS — E1169 Type 2 diabetes mellitus with other specified complication: Secondary | ICD-10-CM | POA: Diagnosis not present

## 2024-10-26 DIAGNOSIS — M47816 Spondylosis without myelopathy or radiculopathy, lumbar region: Secondary | ICD-10-CM | POA: Diagnosis not present

## 2024-10-26 DIAGNOSIS — G629 Polyneuropathy, unspecified: Secondary | ICD-10-CM | POA: Diagnosis not present

## 2024-10-26 DIAGNOSIS — D539 Nutritional anemia, unspecified: Secondary | ICD-10-CM | POA: Diagnosis not present

## 2024-10-26 DIAGNOSIS — Z79899 Other long term (current) drug therapy: Secondary | ICD-10-CM | POA: Diagnosis not present

## 2024-10-26 DIAGNOSIS — Z9181 History of falling: Secondary | ICD-10-CM | POA: Diagnosis not present

## 2024-10-26 DIAGNOSIS — M1711 Unilateral primary osteoarthritis, right knee: Secondary | ICD-10-CM | POA: Diagnosis not present

## 2024-10-30 DIAGNOSIS — Z79899 Other long term (current) drug therapy: Secondary | ICD-10-CM | POA: Diagnosis not present

## 2024-11-02 ENCOUNTER — Other Ambulatory Visit: Payer: Self-pay | Admitting: Internal Medicine

## 2024-11-02 DIAGNOSIS — E1159 Type 2 diabetes mellitus with other circulatory complications: Secondary | ICD-10-CM

## 2024-11-03 DIAGNOSIS — M7672 Peroneal tendinitis, left leg: Secondary | ICD-10-CM | POA: Insufficient documentation

## 2024-11-03 DIAGNOSIS — S93402A Sprain of unspecified ligament of left ankle, initial encounter: Secondary | ICD-10-CM | POA: Insufficient documentation

## 2024-11-03 DIAGNOSIS — E1142 Type 2 diabetes mellitus with diabetic polyneuropathy: Secondary | ICD-10-CM | POA: Insufficient documentation

## 2024-11-05 ENCOUNTER — Other Ambulatory Visit: Payer: Self-pay | Admitting: Physician Assistant

## 2024-11-05 DIAGNOSIS — M25572 Pain in left ankle and joints of left foot: Secondary | ICD-10-CM

## 2024-11-09 ENCOUNTER — Ambulatory Visit
Admission: RE | Admit: 2024-11-09 | Discharge: 2024-11-09 | Disposition: A | Discharge status: Home / Self Care | Source: Ambulatory Visit | Attending: Physician Assistant | Admitting: Physician Assistant

## 2024-11-09 DIAGNOSIS — M25572 Pain in left ankle and joints of left foot: Secondary | ICD-10-CM

## 2024-11-10 ENCOUNTER — Encounter: Payer: Self-pay | Admitting: Podiatry

## 2024-11-10 ENCOUNTER — Ambulatory Visit: Admitting: Podiatry

## 2024-11-10 DIAGNOSIS — L84 Corns and callosities: Secondary | ICD-10-CM

## 2024-11-10 DIAGNOSIS — E1142 Type 2 diabetes mellitus with diabetic polyneuropathy: Secondary | ICD-10-CM

## 2024-11-10 NOTE — Progress Notes (Signed)
 This patient presents to the office for painful callus on right feet.  Patient says the callus are painful walking and wearing her shoes.  She was treated with debridement previous visit.  She was also prescribed lidocaine  cream.  She presents for evaluation and treatment.  General Appearance  Alert, conversant and in no acute stress.  Vascular  Dorsalis pedis and posterior tibial  pulses are palpable  bilaterally.  Capillary return is within normal limits  bilaterally. Temperature is within normal limits  bilaterally.  Neurologic  Senn-Weinstein monofilament wire test within normal limits  bilaterally. Muscle power within normal limits bilaterally.  Nails Thick disfigured discolored nails with subungual debris  from hallux to fifth toes bilaterally. No evidence of bacterial infection or drainage bilaterally.  Orthopedic  No limitations of motion  feet .  No crepitus or effusions noted.  No bony pathology or digital deformities noted. Plantar flexed 1st ray  B/L.   Skin  normotropic skin with no porokeratosis noted bilaterally.  No signs of infections or ulcers noted.   Callus right hallux and sub sesamoid right foot.  No callus left   Callus B/L.    Debride callus with # 15 blade and dremel tool.     Prescribe lidocaine  cream.  RTC 3 months    .GAM

## 2024-11-19 ENCOUNTER — Ambulatory Visit
Admission: RE | Admit: 2024-11-19 | Discharge: 2024-11-19 | Disposition: A | Discharge status: Home / Self Care | Source: Ambulatory Visit

## 2024-11-19 ENCOUNTER — Ambulatory Visit: Payer: Self-pay

## 2024-11-19 VITALS — BP 133/80 | HR 66 | Temp 98.5°F | Resp 18 | Wt 257.9 lb

## 2024-11-19 DIAGNOSIS — S93402A Sprain of unspecified ligament of left ankle, initial encounter: Secondary | ICD-10-CM | POA: Diagnosis not present

## 2024-11-19 DIAGNOSIS — S8000XA Contusion of unspecified knee, initial encounter: Secondary | ICD-10-CM | POA: Diagnosis not present

## 2024-11-19 DIAGNOSIS — W1849XA Other slipping, tripping and stumbling without falling, initial encounter: Secondary | ICD-10-CM | POA: Diagnosis not present

## 2024-11-19 NOTE — Telephone Encounter (Signed)
 Spoke with patient .Patient is going to [facility] tomorrow to be evaluated  and get xray's.

## 2024-11-19 NOTE — ED Triage Notes (Signed)
 Pt presents c/o R knee pain and L ankle pain x 3 days. Pt states,  I fell Sunday getting off the [employer] bus when putting my groceries on the bus. I didn't feel it until Sunday night and Mnday. The pain so bad. I was already having problems with my ankle so the fall triggered it even more and made it worse. I fell forward and landed on the front of my body. I didn't hit my head though. My body just aching from the fall. I been taking gabapentin .  Pt denies LOC after the fall.

## 2024-11-19 NOTE — Discharge Instructions (Signed)
 Use Meloxicam  and gabapentin  as needed for pain.   Today you have been diagnosed with a musculoskeletal injury.   You should use ice on affected area for 20 minutes at a time a couple times a day for the first 24 hours then you may switch to heat in the same intervals.  Be sure to put a barrier between ice or heat source and skin to prevent burns.  May also wrap affected area and Ace bandage if tolerated and appropriate, and elevate above the level of the heart to help reduce swelling.  Do not wrap Ace bandages around neck or torso as wrapping too tight can restrict air movement inability to breathe.  If symptoms do not seem to be improving in 3 to 5 days after following these instructions we need to follow-up with orthopedist or PCP.

## 2024-11-19 NOTE — ED Provider Notes (Signed)
 [facility]    CSN: [id number] Arrival date & time: 11/19/24  1138      History   Chief Complaint Chief Complaint  Patient presents with   Fall   Knee Pain    R knee   Ankle Pain    L ankle     HPI Karen Tanner is a 53 y.o. female.   Pt presents today due to injury while using city bus on Sunday. She states that she missed a step on the bus steps when stepped off to get the rest of her groceries to put on the bus. Pt states that she fell forward onto both of her knees. Pt states that she was already experiencing pain in left ankle but the fall on Sunday caused to have increased pain. Pt states that she is now experiencing constant 8/10 pain. Pt states that on Sunday night she took one hydrocodone with no significant relief. Pt states that she has been using gabapentin  with more relief but still is experiencing pain. Pt has Meloxicam  at home and states that she is not using it because she feels that that is too much medicine. Pt states that she has been using ice for symptoms with some relief. Pt understands that we have no xray in office today.   The history is provided by the patient.  Fall  Knee Pain Ankle Pain   Past Medical History:  Diagnosis Date   Anemia    Arthritis    knee   Diabetes mellitus without complication (HCC)    Hypertension    Hypertensive retinopathy     Patient Active Problem List   Diagnosis Date Noted   Diabetic peripheral neuropathy (HCC) 11/03/2024   Peroneal tendinitis of left lower extremity 11/03/2024   Sprain of left ankle 11/03/2024   Acne scarring 08/18/2024   Primary osteoarthritis of both knees 07/31/2024   Lumbar post-laminectomy syndrome 04/30/2022   Lumbar spondylosis 04/30/2022   Intraductal papilloma 12/20/2021   Osteoarthritis of right knee 10/26/2021   Arthralgia of right knee 10/25/2021   Drinking binge 04/17/2021   Tobacco dependence 04/17/2021   Hyperlipidemia associated with type 2 diabetes mellitus  (HCC) 04/17/2021   Uterine leiomyoma 08/21/2018   Type 2 diabetes mellitus with obesity 05/21/2016   History of back surgery 10/26/2014   History of anemia 10/26/2014   Essential hypertension 10/26/2014   Chronic low back pain 10/26/2014   Menorrhagia with regular cycle 10/26/2014    Past Surgical History:  Procedure Laterality Date   BACK SURGERY     Bone fusion   BREAST LUMPECTOMY Left 03/09/2022   intraductal papilloma with calcifications   BREAST LUMPECTOMY WITH RADIOACTIVE SEED LOCALIZATION Left 03/09/2022   Procedure: LEFT BREAST LUMPECTOMY WITH RADIOACTIVE SEED LOCALIZATION;  Surgeon: Curvin Deward MOULD, MD;  Location: [facility];  Service: General;  Laterality: Left;    OB History   No obstetric history on file.      Home Medications    Prior to Admission medications   Medication Sig Start Date End Date Taking? Authorizing Provider  amoxicillin  (AMOXIL ) 500 MG capsule Take 500 mg by mouth every 8 (eight) hours. 06/18/24  Yes [provider]  diclofenac Sodium (VOLTAREN) 1 % GEL APPLY 2 GRAMS TO THE AFFECTED AREA(S) BY TOPICAL ROUTE 4 TIMES PER DAY 11/03/24  Yes [provider]  Accu-Chek Softclix Lancets lancets Use to check blood sugar once daily. Dx: E11.69 10/16/23   Vicci Barnie NOVAK, MD  amLODipine  (NORVASC ) 10  MG tablet TAKE 1 TABLET BY MOUTH DAILY 06/19/24   Vicci Barnie NOVAK, MD  atorvastatin  (LIPITOR) 20 MG tablet TAKE 1 TABLET BY MOUTH EVERY DAY 03/25/24   Vicci Barnie NOVAK, MD  Blood Glucose Monitoring Suppl (ACCU-CHEK GUIDE) w/Device KIT Use to check blood sugar once daily. Dx: E11.69 05/05/24   Vicci Barnie NOVAK, MD  Blood Pressure Monitor KIT Use to check blood pressure daily 04/23/22   Vicci Barnie NOVAK, MD  Continuous Glucose Receiver (FREESTYLE LIBRE 3 READER) DEVI UAD to check blood sugars 10/29/23   Vicci Barnie NOVAK, MD  Continuous Glucose Sensor (FREESTYLE LIBRE 3 PLUS SENSOR) MISC Change sensor every 15 days. 10/29/23    Vicci Barnie NOVAK, MD  gabapentin  (NEURONTIN ) 300 MG capsule TAKE 2 CAPSULES BY MOUTH 3 TIMES DAILY 10/19/24   Silva Juliene SAUNDERS, DPM  glimepiride  (AMARYL ) 2 MG tablet Take 2 mg by mouth every morning.    [provider]  glucose blood (ACCU-CHEK GUIDE) test strip Use to check blood sugar once daily. Dx: E11.69 10/16/23   Vicci Barnie NOVAK, MD  HYDROcodone-acetaminophen  [facility]) 10-325 MG tablet Take 1 tablet by mouth 3 (three) times daily as needed. 12/02/23   [provider]  lidocaine -prilocaine  (EMLA ) cream Apply 1 Application topically as needed. Patient not taking: Reported on 11/10/2024 08/10/24   Loreda Hacker, DPM  meloxicam  (MOBIC ) 15 MG tablet TAKE 1 TABLET BY MOUTH DAILY 06/16/24   Vicci Barnie NOVAK, MD  methocarbamol  (ROBAXIN ) 500 MG tablet TAKE 1 TABLET (500 MG TOTAL) BY MOUTH 2 (TWO) TIMES DAILY AS NEEDED FOR MUSCLE SPASMS. 10/29/23   Vicci Barnie NOVAK, MD  Misc. Devices MISC Blood pressure device 04/11/22   Vicci Barnie NOVAK, MD  Semaglutide ,0.25 or 0.5MG /DOS, 2 MG/3ML SOPN Inject 0.5 mg into the skin once a week. 10/15/24   Vicci Barnie NOVAK, MD  valsartan  (DIOVAN ) 160 MG tablet Take 1 tablet (160 mg total) by mouth daily. 10/15/24   Vicci Barnie NOVAK, MD    Family History Family History  Problem Relation Age of Onset   Diabetes Mother    Hyperlipidemia Mother    Heart disease Mother    Diabetes Father    Diabetes Maternal Grandmother    BRCA 1/2 Neg Hx    Breast cancer Neg Hx     Social History Social History   Tobacco Use   Smoking status: Every Day    Current packs/day: 0.50    Average packs/day: 0.3 packs/day for 14.9 years (3.9 ttl pk-yrs)    Types: Cigarettes    Start date: 2025    Passive exposure: Current   Smokeless tobacco: Never   Tobacco comments:    Smoking 6-7 cigs per day  Vaping Use   Vaping status: Never Used  Substance Use Topics   Alcohol use: Yes    Comment: 5-6 drinks per day on weekends   Drug use: Not Currently     Types: Marijuana     Allergies   Patient has no known allergies.   Review of Systems Review of Systems   Physical Exam Triage Vital Signs ED Triage Vitals  Encounter Vitals Group     BP 11/19/24 1225 133/80     Girls Systolic BP Percentile --      Girls Diastolic BP Percentile --      Boys Systolic BP Percentile --      Boys Diastolic BP Percentile --      Pulse Rate 11/19/24 1225 66     Resp 11/19/24 1225  18     Temp 11/19/24 1225 98.5 F (36.9 C)     Temp Source 11/19/24 1225 Oral     SpO2 11/19/24 1225 97 %     Weight 11/19/24 1224 257 lb 15 oz (117 kg)     Height --      Head Circumference --      Peak Flow --      Pain Score 11/19/24 1223 8     Pain Loc --      Pain Education --      Exclude from Growth Chart --    No data found.  Updated Vital Signs BP 133/80 (BP Location: Left Arm)   Pulse 66   Temp 98.5 F (36.9 C) (Oral)   Resp 18   Wt 257 lb 15 oz (117 kg)   LMP  (LMP Unknown) Comment: Patient states last period was over a year ago  SpO2 97%   BMI 40.40 kg/m   Visual Acuity Right Eye Distance:   Left Eye Distance:   Bilateral Distance:    Right Eye Near:   Left Eye Near:    Bilateral Near:     Physical Exam Vitals and nursing note reviewed.  Constitutional:      General: She is not in acute distress.    Appearance: Normal appearance. She is not ill-appearing, toxic-appearing or diaphoretic.  Eyes:     General: No scleral icterus. Cardiovascular:     Rate and Rhythm: Normal rate and regular rhythm.     Heart sounds: Normal heart sounds.  Pulmonary:     Effort: Pulmonary effort is normal. No respiratory distress.     Breath sounds: Normal breath sounds. No wheezing or rhonchi.  Musculoskeletal:       Legs:     Comments: Tenderness to palpation of lateral aspect of left ankle, mild swelling noted, reduced range of motion of bilateral knees due to pain  Skin:    General: Skin is warm.  Neurological:     Mental Status: She is  alert and oriented to person, place, and time.  Psychiatric:        Mood and Affect: Mood normal.        Behavior: Behavior normal.      UC Treatments / Results  Labs (all labs ordered are listed, but only abnormal results are displayed) Labs Reviewed - No data to display  EKG   Radiology No results found.  Procedures Procedures (including critical care time)  Medications Ordered in UC Medications - No data to display  Initial Impression / Assessment and Plan / UC Course  I have reviewed the triage vital signs and the nursing notes.  Pertinent labs & imaging results that were available during my care of the patient were reviewed by me and considered in my medical decision making (see chart for details)    Final Clinical Impressions(s) / UC Diagnoses   Final diagnoses:  Other slipping, tripping and stumbling without falling, initial encounter  Contusion of knee, unspecified laterality, initial encounter  Sprain of left ankle, unspecified ligament, initial encounter     Discharge Instructions      Use Meloxicam  and gabapentin  as needed for pain.   Today you have been diagnosed with a musculoskeletal injury.   You should use ice on affected area for 20 minutes at a time a couple times a day for the first 24 hours then you may switch to heat in the same intervals.  Be sure to put a  barrier between ice or heat source and skin to prevent burns.  May also wrap affected area and Ace bandage if tolerated and appropriate, and elevate above the level of the heart to help reduce swelling.  Do not wrap Ace bandages around neck or torso as wrapping too tight can restrict air movement inability to breathe.  If symptoms do not seem to be improving in 3 to 5 days after following these instructions we need to follow-up with orthopedist or PCP.     ED Prescriptions   None    PDMP not reviewed this encounter.   Andra Corean BROCKS, PA-C 11/19/24 1310

## 2024-11-19 NOTE — Telephone Encounter (Signed)
 FYI Only or Action Required?: Action required by provider: request for appointment and patient is needing to be evaluated as she fell trying to get onto transportation bus.  Patient was last seen in primary care on 10/15/2024 by Vicci Barnie NOVAK, MD.  Called Nurse Triage reporting Knee Pain.  Symptoms began several days ago.  Interventions attempted: Rest, hydration, or home remedies and Ice/heat application.  Symptoms are: unchanged.  Triage Disposition: See HCP Within 4 Hours (Or PCP Triage)  Patient/caregiver understands and will follow disposition?: Yes  Copied from CRM #[id number]. Topic: Clinical - Red Word Triage >> Nov 19, 2024  2:47 PM Rosaria BRAVO wrote: Red Word that prompted transfer to Nurse Triage: [place] Sunday, swollen right knee and ankle. Urgent care was unable to assist, pt wants Xray. Reason for Disposition  [1] SEVERE pain (e.g., excruciating, unable to walk) AND [2] not improved after 2 hours of pain medicine  Answer Assessment - Initial Assessment Questions Patient reports falling on her right knee on Sunday. Patient was trying to get onto her transportation at [employer] and lost her footing. Patient endorses not able to bend her knee when she is walking. Patient is needing a call back to be seen in the office.   1. LOCATION and RADIATION: Where is the pain located?      Right knee 2. QUALITY: What does the pain feel like?  (e.g., sharp, dull, aching, burning)     aching 3. SEVERITY: How bad is the pain? What does it keep you from doing?   (Scale 1-10; or mild, moderate, severe)     8 out of 10 4. ONSET: When did the pain start? Does it come and go, or is it there all the time?     Patient reports falling while trying to get into the bus. Falling on her right knee  5. RECURRENT: Have you had this pain before? If Yes, ask: When, and what happened then?     no 6. SETTING: Has there been any recent work, exercise or other activity that involved that  part of the body?      no 7. AGGRAVATING FACTORS: What makes the knee pain worse? (e.g., walking, climbing stairs, running)     walking 8. ASSOCIATED SYMPTOMS: Is there any swelling or redness of the knee?     swelling 9. OTHER SYMPTOMS: Do you have any other symptoms? (e.g., calf pain, chest pain, difficulty breathing, fever)     Ankle pain  Protocols used: Knee Pain-A-AH

## 2024-12-02 ENCOUNTER — Telehealth: Payer: Self-pay | Admitting: Internal Medicine

## 2024-12-02 NOTE — Telephone Encounter (Signed)
 CONFIRMED APPT FOR 12/4

## 2024-12-03 ENCOUNTER — Ambulatory Visit: Attending: Internal Medicine | Admitting: Internal Medicine

## 2024-12-03 ENCOUNTER — Encounter: Payer: Self-pay | Admitting: Internal Medicine

## 2024-12-03 DIAGNOSIS — E1169 Type 2 diabetes mellitus with other specified complication: Secondary | ICD-10-CM

## 2024-12-03 DIAGNOSIS — E1159 Type 2 diabetes mellitus with other circulatory complications: Secondary | ICD-10-CM

## 2024-12-03 DIAGNOSIS — Z7985 Long-term (current) use of injectable non-insulin antidiabetic drugs: Secondary | ICD-10-CM

## 2024-12-03 DIAGNOSIS — M1711 Unilateral primary osteoarthritis, right knee: Secondary | ICD-10-CM | POA: Diagnosis not present

## 2024-12-03 DIAGNOSIS — Z6841 Body Mass Index (BMI) 40.0 and over, adult: Secondary | ICD-10-CM

## 2024-12-03 DIAGNOSIS — I152 Hypertension secondary to endocrine disorders: Secondary | ICD-10-CM

## 2024-12-03 MED ORDER — SEMAGLUTIDE (1 MG/DOSE) 4 MG/3ML ~~LOC~~ SOPN: 1.0000 mg | PEN_INJECTOR | SUBCUTANEOUS | 2 refills | Status: DC

## 2024-12-03 NOTE — Progress Notes (Unsigned)
 Patient ID: Karen Tanner, female    DOB: 01/10/1971  MRN: [id number]  CC: Obesity (Ozempic  f/u - concern of no weight loss /Reports termination from pain management due to marijuana use - requesting referral to pain management /Reports fall 1 mo ago - See UC notes/Already received flu vax. )   Subjective: Karen Tanner is a 53 y.o. female who presents for chronic ds management. NP Placey is with me. Her concerns today include:  Patient with history of HTN, diabetes 2 with peripheral neuropathy, HL, tob dep, anemia, chronic LBP had fusion surgery 2004, OA RT knee, history of papilloma LT breast 02/2022 (does not need chemoprophylaxis per onc Dr. Loretha), fibroid   Discussed the use of AI scribe software for clinical note transcription with the patient, who gave verbal consent to proceed.  History of Present Illness Karen Tanner is a 53 year old female with obesity and hypertension who presents for a follow-up on weight management with Ozempic .  She was last seen on October 16th for a six-week follow-up regarding her weight management with Ozempic . At that time, her dose was increased to 0.5 mg weekly. She has been taking the medication consistently every week. Tolerating Ozempic  without significant S.E. Since the last visit, her weight has decreased by two pounds, from 258 to 256 pounds. Despite the medication curbing her appetite, she continues to snack on items like white cheddar popcorn and muffins, particularly during the holidays and her birthday, which coincided with Thanksgiving.  HTN: currently taking valsartan  160 mg and amlodipine  10 mg daily. She takes these medications every morning. She has not been regularly checking her blood pressure at home, although she has a device available. She does not add salt to her food.  She was previously under pain management at [facility] for knee arthritis, treated with hydrocodone 10/325 mg as needed. However, she was terminated from the  program after a positive marijuana test, which she attributes to social use at a friend's gathering recently. She does not regularly use marijuana and only drinks wine occasionally on weekends. She is followed by [facility] for the arthritis and gets steroid inj periodically to the RT knee.  She experienced a fall while boarding a city bus with grocery bags last mth, resulting in knee and ankle pain. She was evaluated at an urgent care where x-rays were performed, and she was advised to take gabapentin  or ibuprofen  for pain and to keep the affected area elevated. Not in pain now.      Patient Active Problem List   Diagnosis Date Noted   Diabetic peripheral neuropathy (HCC) 11/03/2024   Peroneal tendinitis of left lower extremity 11/03/2024   Sprain of left ankle 11/03/2024   Acne scarring 08/18/2024   Primary osteoarthritis of both knees 07/31/2024   Lumbar post-laminectomy syndrome 04/30/2022   Lumbar spondylosis 04/30/2022   Intraductal papilloma 12/20/2021   Osteoarthritis of right knee 10/26/2021   Arthralgia of right knee 10/25/2021   Drinking binge 04/17/2021   Tobacco dependence 04/17/2021   Hyperlipidemia associated with type 2 diabetes mellitus (HCC) 04/17/2021   Uterine leiomyoma 08/21/2018   Type 2 diabetes mellitus with obesity 05/21/2016   History of back surgery 10/26/2014   History of anemia 10/26/2014   Essential hypertension 10/26/2014   Chronic low back pain 10/26/2014   Menorrhagia with regular cycle 10/26/2014     Current Outpatient Medications on File Prior to Visit  Medication Sig Dispense Refill   Accu-Chek Softclix Lancets lancets Use to  check blood sugar once daily. Dx: E11.69 100 each 2   amLODipine  (NORVASC ) 10 MG tablet TAKE 1 TABLET BY MOUTH DAILY 100 tablet 2   amoxicillin  (AMOXIL ) 500 MG capsule Take 500 mg by mouth every 8 (eight) hours.     atorvastatin  (LIPITOR) 20 MG tablet TAKE 1 TABLET BY MOUTH EVERY DAY 90 tablet 2   Blood Glucose  Monitoring Suppl (ACCU-CHEK GUIDE) w/Device KIT Use to check blood sugar once daily. Dx: E11.69 1 kit 0   Blood Pressure Monitor KIT Use to check blood pressure daily 1 kit 0   Continuous Glucose Receiver (FREESTYLE LIBRE 3 READER) DEVI UAD to check blood sugars 1 each 0   Continuous Glucose Sensor (FREESTYLE LIBRE 3 PLUS SENSOR) MISC Change sensor every 15 days. 2 each 11   diclofenac Sodium (VOLTAREN) 1 % GEL APPLY 2 GRAMS TO THE AFFECTED AREA(S) BY TOPICAL ROUTE 4 TIMES PER DAY     gabapentin  (NEURONTIN ) 300 MG capsule TAKE 2 CAPSULES BY MOUTH 3 TIMES DAILY 600 capsule 2   glimepiride  (AMARYL ) 2 MG tablet Take 2 mg by mouth every morning.     glucose blood (ACCU-CHEK GUIDE) test strip Use to check blood sugar once daily. Dx: E11.69 100 each 12   HYDROcodone-acetaminophen  (NORCO) 10-325 MG tablet Take 1 tablet by mouth 3 (three) times daily as needed.     meloxicam  (MOBIC ) 15 MG tablet TAKE 1 TABLET BY MOUTH DAILY 100 tablet 0   methocarbamol  (ROBAXIN ) 500 MG tablet TAKE 1 TABLET (500 MG TOTAL) BY MOUTH 2 (TWO) TIMES DAILY AS NEEDED FOR MUSCLE SPASMS. 60 tablet 0   Misc. Devices MISC Blood pressure device 1 Device 0   valsartan  (DIOVAN ) 160 MG tablet Take 1 tablet (160 mg total) by mouth daily. 90 tablet 1   lidocaine -prilocaine  (EMLA ) cream Apply 1 Application topically as needed. (Patient not taking: Reported on 12/03/2024) 30 g 0   No current facility-administered medications on file prior to visit.    No Known Allergies  Social History   Socioeconomic History   Marital status: Single    Spouse name: Not on file   Number of children: Not on file   Years of education: Not on file   Highest education level: GED or equivalent  Occupational History   Not on file  Tobacco Use   Smoking status: Every Day    Current packs/day: 0.50    Average packs/day: 0.3 packs/day for 14.9 years (4.0 ttl pk-yrs)    Types: Cigarettes    Start date: 2025    Passive exposure: Current   Smokeless  tobacco: Never   Tobacco comments:    Smoking 6-7 cigs per day  Vaping Use   Vaping status: Never Used  Substance and Sexual Activity   Alcohol use: Yes    Comment: 5-6 drinks per day on weekends   Drug use: Not Currently    Types: Marijuana   Sexual activity: Not on file  Other Topics Concern   Not on file  Social History Narrative   Not on file   Social Drivers of Health   Financial Resource Strain: Medium Risk (07/14/2024)   Overall Financial Resource Strain (CARDIA)    Difficulty of Paying Living Expenses: Somewhat hard  Food Insecurity: Food Insecurity Present (07/14/2024)   Hunger Vital Sign    Worried About Running Out of Food in the Last Year: Sometimes true    Ran Out of Food in the Last Year: Sometimes true  Transportation Needs: Unmet Transportation  Needs (07/14/2024)   PRAPARE - Transportation    Lack of Transportation (Medical): No    Lack of Transportation (Non-Medical): Yes  Physical Activity: Inactive (07/14/2024)   Exercise Vital Sign    Days of Exercise per Week: 0 days    Minutes of Exercise per Session: 0 min  Stress: No Stress Concern Present (07/14/2024)   [employer] of Occupational Health - Occupational Stress Questionnaire    Feeling of Stress: Not at all  Social Connections: Moderately Integrated (07/14/2024)   Social Connection and Isolation Panel    Frequency of Communication with Friends and Family: More than three times a week    Frequency of Social Gatherings with Friends and Family: More than three times a week    Attends Religious Services: More than 4 times per year    Active Member of [employer] or Organizations: Yes    Attends Banker Meetings: 1 to 4 times per year    Marital Status: Never married  Intimate Partner Violence: Not At Risk (07/14/2024)   Humiliation, Afraid, Rape, and Kick questionnaire    Fear of Current or Ex-Partner: No    Emotionally Abused: No    Physically Abused: No    Sexually Abused: No    Family  History  Problem Relation Age of Onset   Diabetes Mother    Hyperlipidemia Mother    Heart disease Mother    Diabetes Father    Diabetes Maternal Grandmother    BRCA 1/2 Neg Hx    Breast cancer Neg Hx     Past Surgical History:  Procedure Laterality Date   BACK SURGERY     Bone fusion   BREAST LUMPECTOMY Left 03/09/2022   intraductal papilloma with calcifications   BREAST LUMPECTOMY WITH RADIOACTIVE SEED LOCALIZATION Left 03/09/2022   Procedure: LEFT BREAST LUMPECTOMY WITH RADIOACTIVE SEED LOCALIZATION;  Surgeon: Curvin Deward MOULD, MD;  Location: [facility];  Service: General;  Laterality: Left;    ROS: Review of Systems Negative except as stated above  PHYSICAL EXAM: BP 135/71   Pulse 68   Ht 5' 7 (1.702 m)   Wt 256 lb (116.1 kg)   LMP  (LMP Unknown) Comment: Patient states last period was over a year ago  SpO2 98%   BMI 40.10 kg/m   Wt Readings from Last 3 Encounters:  12/03/24 256 lb (116.1 kg)  11/19/24 257 lb 15 oz (117 kg)  10/15/24 258 lb (117 kg)    Physical Exam  General appearance - alert, well appearing, midle age obese, AAF and in no distress Mental status - normal mood, behavior, speech, dress, motor activity, and thought processes Chest - clear to auscultation, no wheezes, rales or rhonchi, symmetric air entry Heart - normal rate, regular rhythm, normal S1, S2, no murmurs, rubs, clicks or gallops      Latest Ref Rng & Units 10/20/2024    2:14 PM 06/05/2023    3:00 PM 09/18/2022    4:53 PM  CMP  Glucose 70 - 99 mg/dL 61  886  78   BUN 6 - 24 mg/dL 11  20  11    Creatinine 0.57 - 1.00 mg/dL 9.33  9.07  9.41   Sodium 134 - 144 mmol/L 140  140  139   Potassium 3.5 - 5.2 mmol/L 4.5  4.7  3.4   Chloride 96 - 106 mmol/L 101  101  102   CO2 20 - 29 mmol/L 24  26  28    Calcium  8.7 -  10.2 mg/dL 89.9  9.8  9.6   Total Protein 6.0 - 8.5 g/dL 7.4  7.4  7.4   Total Bilirubin 0.0 - 1.2 mg/dL 0.3  0.4  0.5   Alkaline Phos 49 - 135 IU/L 94  93   61   AST 0 - 40 IU/L 24  32  23   ALT 0 - 32 IU/L 18  25  20     Lipid Panel     Component Value Date/Time   CHOL 162 10/20/2024 1414   TRIG 98 10/20/2024 1414   HDL 60 10/20/2024 1414   CHOLHDL 2.7 10/20/2024 1414   CHOLHDL 3.2 01/23/2016 0956   VLDL 9 01/23/2016 0956   LDLCALC 84 10/20/2024 1414    CBC    Component Value Date/Time   WBC 5.9 10/20/2024 1414   WBC 6.8 09/18/2022 1653   RBC 5.03 10/20/2024 1414   RBC 4.82 09/18/2022 1653   HGB 13.7 10/20/2024 1414   HCT 43.2 10/20/2024 1414   PLT 244 10/20/2024 1414   MCV 86 10/20/2024 1414   MCH 27.2 10/20/2024 1414   MCH 27.8 09/18/2022 1653   MCHC 31.7 10/20/2024 1414   MCHC 32.6 09/18/2022 1653   RDW 14.3 10/20/2024 1414   LYMPHSABS 1.3 09/07/2022 0137   MONOABS 0.8 09/07/2022 0137   EOSABS 0.0 09/07/2022 0137   BASOSABS 0.1 09/07/2022 0137    ASSESSMENT AND PLAN: 1. Type 2 diabetes mellitus with morbid obesity (HCC) (Primary) Weight decreased by only 2 pounds. Ozempic  0.5 mg weekly well-tolerated. Discussed increasing Ozempic  to 1 mg for enhanced weight loss  - Increased Ozempic  to 1 mg weekly by administering two 0.5 mg inj per week until current supply is exhausted, then switch to 1 mg pen. - Encouraged dietary changes, including avoiding high-calorie snacks and opting for healthier alternatives like fruits and unsalted nuts. - Semaglutide , 1 MG/DOSE, 4 MG/3ML SOPN; Inject 1 mg as directed once a week.  Dispense: 3 mL; Refill: 2  2. Hypertension associated with type 2 diabetes mellitus (HCC) Blood pressure improved to 135/73 upon recheck. Discussed importance of home blood pressure monitoring to assess for white coat hypertension. - Continue valsartan  160 mg and amlodipine  10 mg daily. - Encouraged home blood pressure monitoring at least once a week, ensuring proper technique.  3. Primary osteoarthritis of right knee Seeking alternative pain management option/referral due to previous termination from [facility].  Advised that I will submit referral to a different Pain Management Clinic but I make no guarantees that they will prescribe narcotic med -Wgh loss will help - Ambulatory referral to Pain Clinic   Patient was given the opportunity to ask questions.  Patient verbalized understanding of the plan and was able to repeat key elements of the plan.   This documentation was completed using Paediatric nurse.  Any transcriptional errors are unintentional.  Orders Placed This Encounter  Procedures   Ambulatory referral to Pain Clinic     Requested Prescriptions   Signed Prescriptions Disp Refills   Semaglutide , 1 MG/DOSE, 4 MG/3ML SOPN 3 mL 2    Sig: Inject 1 mg as directed once a week.    Return in about 4 months (around 04/03/2025).  Barnie Louder, MD, FACP

## 2024-12-03 NOTE — Patient Instructions (Signed)
  VISIT SUMMARY: Today, you had a follow-up appointment to discuss your weight management with Ozempic , your hypertension, and your knee and ankle pain after a recent fall. We reviewed your progress and made some adjustments to your treatment plan to help you achieve better health outcomes.  YOUR PLAN: -TYPE 2 DIABETES MELLITUS WITH MORBID OBESITY: Type 2 diabetes is a condition where your body does not use insulin properly, leading to high blood sugar levels. We are increasing your Ozempic  dose to 1 mg weekly to help with weight loss and blood sugar control. You should take 0.5 mg twice a week until your current supply is finished, then switch to the 1 mg pen. Additionally, try to avoid high-calorie snacks and choose healthier options like fruits and unsalted nuts.  -HYPERTENSION ASSOCIATED WITH TYPE 2 DIABETES MELLITUS: Hypertension, or high blood pressure, is often linked with diabetes and can increase the risk of heart disease. Your blood pressure was 135/73 today, which is an improvement. Continue taking valsartan  160 mg and amlodipine  10 mg daily. It's important to monitor your blood pressure at home at least once a week to ensure it stays under control.  -PRIMARY OSTEOARTHRITIS OF RIGHT KNEE: Osteoarthritis is a condition that causes joint pain and stiffness. We are referring you to [facility] for ongoing treatment of your knee arthritis. Continue taking hydrocodone as needed for severe pain.  -ACUTE RIGHT KNEE AND ANKLE PAIN AFTER FALL: You have pain in your right knee and ankle from a recent fall, but x-rays showed no fractures. Continue taking gabapentin  and ibuprofen  as needed for pain, and keep the affected limb elevated to reduce swelling.  INSTRUCTIONS: Please follow up with [facility] for your knee arthritis. Monitor your blood pressure at home at least once a week and record the readings. Continue with the increased Ozempic  dose as discussed, and make dietary changes  to support your weight loss and blood sugar control. If you have any new symptoms or concerns, please contact our office.                      Contains text generated by Abridge.                                 Contains text generated by Abridge.

## 2024-12-05 ENCOUNTER — Encounter: Payer: Self-pay | Admitting: Internal Medicine

## 2025-01-20 ENCOUNTER — Other Ambulatory Visit: Payer: Self-pay | Admitting: Internal Medicine

## 2025-01-20 NOTE — Telephone Encounter (Unsigned)
 Copied from CRM #[id number]. Topic: Clinical - Medication Refill >> Jan 20, 2025  3:44 PM Emylou G wrote: Medication: Semaglutide , 1 MG/DOSE, 4 MG/3ML SOPN  Has the patient contacted their pharmacy? No (Agent: If no, request that the patient contact the pharmacy for the refill. If patient does not wish to contact the pharmacy document the reason why and proceed with request.) (Agent: If yes, when and what did the pharmacy advise?)  This is the patient's preferred pharmacy:  [id number] [place], [place] - [address] [place] [address] Phone: [phone] Fax: [phone]   Is this the correct pharmacy for this prescription? Yes If no, delete pharmacy and type the correct one.   Has the prescription been filled recently? No  Is the patient out of the medication? Yes  Has the patient been seen for an appointment in the last year OR does the patient have an upcoming appointment? Yes  Can we respond through MyChart? No  Agent: Please be advised that Rx refills may take up to 3 business days. We ask that you follow-up with your pharmacy.

## 2025-01-21 MED ORDER — SEMAGLUTIDE (1 MG/DOSE) 4 MG/3ML ~~LOC~~ SOPN: 1.0000 mg | PEN_INJECTOR | SUBCUTANEOUS | 2 refills | Status: AC

## 2025-01-21 NOTE — Telephone Encounter (Signed)
 Requested Prescriptions  Pending Prescriptions Disp Refills   Semaglutide , 1 MG/DOSE, 4 MG/3ML SOPN 3 mL 2    Sig: Inject 1 mg as directed once a week.     Endocrinology:  Diabetes - GLP-1 Receptor Agonists - semaglutide  Passed - 01/21/2025  9:40 AM      Passed - HBA1C in normal range and within 180 days    HbA1c, POC (controlled diabetic range)  Date Value Ref Range Status  10/15/2024 6.1 0.0 - 7.0 % Final         Passed - Cr in normal range and within 360 days    Creat  Date Value Ref Range Status  06/13/2016 0.59 0.50 - 1.10 mg/dL Final   Creatinine, Ser  Date Value Ref Range Status  10/20/2024 0.66 0.57 - 1.00 mg/dL Final   Creatinine, POC  Date Value Ref Range Status  06/19/2023 187.2 mg/dL Final    Comment:    Abstracted by HIM         Passed - Valid encounter within last 6 months    Recent Outpatient Visits           1 month ago Type 2 diabetes mellitus with morbid obesity (HCC)   [facility] [place]. [facility] Vicci Sober B, MD   3 months ago Type 2 diabetes mellitus with morbid obesity [facility])   [facility] Of [place]. [facility] Vicci Sober B, MD   6 months ago Type 2 diabetes mellitus with morbid obesity [facility])   [facility] Of [place]. [facility] Sober B, MD   8 months ago Type 2 diabetes mellitus with morbid obesity [facility])   [facility] Dept Of [place]. [facility] Vicci Sober B, MD   1 year ago Type 2 diabetes mellitus with morbid obesity [facility])    [facility] [place]. [facility] Vicci Sober NOVAK, MD

## 2025-02-10 ENCOUNTER — Ambulatory Visit: Admitting: Podiatry

## 2025-04-05 ENCOUNTER — Ambulatory Visit: Admitting: Internal Medicine

## 2025-07-20 ENCOUNTER — Ambulatory Visit
